# Patient Record
Sex: Female | Born: 1945 | Race: White | Hispanic: No | Marital: Married | Smoking: Former smoker
Health system: Southern US, Community
[De-identification: ages and names within clinical notes are randomized; demographics above are authoritative.]

## PROBLEM LIST (undated history)

## (undated) DIAGNOSIS — G629 Polyneuropathy, unspecified: Secondary | ICD-10-CM

## (undated) DIAGNOSIS — I10 Essential (primary) hypertension: Secondary | ICD-10-CM

## (undated) DIAGNOSIS — G4730 Sleep apnea, unspecified: Secondary | ICD-10-CM

## (undated) DIAGNOSIS — I5189 Other ill-defined heart diseases: Secondary | ICD-10-CM

## (undated) DIAGNOSIS — IMO0002 Reserved for concepts with insufficient information to code with codable children: Secondary | ICD-10-CM

## (undated) DIAGNOSIS — G43909 Migraine, unspecified, not intractable, without status migrainosus: Secondary | ICD-10-CM

## (undated) DIAGNOSIS — K5730 Diverticulosis of large intestine without perforation or abscess without bleeding: Secondary | ICD-10-CM

## (undated) DIAGNOSIS — K210 Gastro-esophageal reflux disease with esophagitis: Secondary | ICD-10-CM

## (undated) DIAGNOSIS — E785 Hyperlipidemia, unspecified: Secondary | ICD-10-CM

## (undated) DIAGNOSIS — I779 Disorder of arteries and arterioles, unspecified: Secondary | ICD-10-CM

## (undated) DIAGNOSIS — M1990 Unspecified osteoarthritis, unspecified site: Secondary | ICD-10-CM

## (undated) DIAGNOSIS — K219 Gastro-esophageal reflux disease without esophagitis: Secondary | ICD-10-CM

## (undated) DIAGNOSIS — K589 Irritable bowel syndrome without diarrhea: Secondary | ICD-10-CM

## (undated) DIAGNOSIS — R9430 Abnormal result of cardiovascular function study, unspecified: Secondary | ICD-10-CM

## (undated) DIAGNOSIS — M797 Fibromyalgia: Secondary | ICD-10-CM

## (undated) DIAGNOSIS — M519 Unspecified thoracic, thoracolumbar and lumbosacral intervertebral disc disorder: Secondary | ICD-10-CM

## (undated) DIAGNOSIS — F329 Major depressive disorder, single episode, unspecified: Secondary | ICD-10-CM

## (undated) DIAGNOSIS — R5383 Other fatigue: Secondary | ICD-10-CM

## (undated) DIAGNOSIS — I739 Peripheral vascular disease, unspecified: Secondary | ICD-10-CM

## (undated) DIAGNOSIS — Z79899 Other long term (current) drug therapy: Secondary | ICD-10-CM

## (undated) DIAGNOSIS — E669 Obesity, unspecified: Secondary | ICD-10-CM

## (undated) DIAGNOSIS — Z9889 Other specified postprocedural states: Secondary | ICD-10-CM

## (undated) DIAGNOSIS — K859 Acute pancreatitis without necrosis or infection, unspecified: Secondary | ICD-10-CM

## (undated) DIAGNOSIS — K648 Other hemorrhoids: Secondary | ICD-10-CM

## (undated) DIAGNOSIS — F32A Depression, unspecified: Secondary | ICD-10-CM

## (undated) HISTORY — DX: Hyperlipidemia, unspecified: E78.5

## (undated) HISTORY — DX: Obesity, unspecified: E66.9

## (undated) HISTORY — DX: Gastro-esophageal reflux disease with esophagitis: K21.0

## (undated) HISTORY — DX: Other fatigue: R53.83

## (undated) HISTORY — DX: Unspecified thoracic, thoracolumbar and lumbosacral intervertebral disc disorder: M51.9

## (undated) HISTORY — DX: Other specified postprocedural states: Z98.890

## (undated) HISTORY — DX: Other hemorrhoids: K64.8

## (undated) HISTORY — DX: Peripheral vascular disease, unspecified: I73.9

## (undated) HISTORY — DX: Unspecified osteoarthritis, unspecified site: M19.90

## (undated) HISTORY — PX: FINGER SURGERY: SHX640

## (undated) HISTORY — DX: Diverticulosis of large intestine without perforation or abscess without bleeding: K57.30

## (undated) HISTORY — DX: Essential (primary) hypertension: I10

## (undated) HISTORY — DX: Other long term (current) drug therapy: Z79.899

## (undated) HISTORY — DX: Abnormal result of cardiovascular function study, unspecified: R94.30

## (undated) HISTORY — DX: Gastro-esophageal reflux disease without esophagitis: K21.9

## (undated) HISTORY — PX: LAPAROSCOPIC OVARIAN CYSTECTOMY: SUR786

## (undated) HISTORY — DX: Irritable bowel syndrome, unspecified: K58.9

## (undated) HISTORY — DX: Polyneuropathy, unspecified: G62.9

## (undated) HISTORY — DX: Fibromyalgia: M79.7

## (undated) HISTORY — DX: Disorder of arteries and arterioles, unspecified: I77.9

## (undated) HISTORY — DX: Other ill-defined heart diseases: I51.89

## (undated) HISTORY — DX: Major depressive disorder, single episode, unspecified: F32.9

## (undated) HISTORY — DX: Reserved for concepts with insufficient information to code with codable children: IMO0002

## (undated) HISTORY — DX: Depression, unspecified: F32.A

## (undated) HISTORY — DX: Acute pancreatitis without necrosis or infection, unspecified: K85.90

## (undated) HISTORY — DX: Sleep apnea, unspecified: G47.30

---
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
## 1994-07-11 HISTORY — PX: TRIGGER FINGER RELEASE: SHX641

## 1999-07-15 ENCOUNTER — Encounter: Payer: Self-pay | Admitting: Internal Medicine

## 1999-07-15 ENCOUNTER — Ambulatory Visit (HOSPITAL_COMMUNITY): Admission: RE | Admit: 1999-07-15 | Discharge: 1999-07-15 | Payer: Self-pay | Admitting: Internal Medicine

## 1999-11-17 ENCOUNTER — Emergency Department (HOSPITAL_COMMUNITY): Admission: EM | Admit: 1999-11-17 | Discharge: 1999-11-17 | Payer: Self-pay | Admitting: Emergency Medicine

## 1999-11-17 ENCOUNTER — Encounter: Payer: Self-pay | Admitting: Emergency Medicine

## 2000-04-21 ENCOUNTER — Encounter (INDEPENDENT_AMBULATORY_CARE_PROVIDER_SITE_OTHER): Payer: Self-pay | Admitting: Specialist

## 2000-04-21 ENCOUNTER — Other Ambulatory Visit: Admission: RE | Admit: 2000-04-21 | Discharge: 2000-04-21 | Payer: Self-pay | Admitting: Gastroenterology

## 2000-04-21 DIAGNOSIS — K2100 Gastro-esophageal reflux disease with esophagitis, without bleeding: Secondary | ICD-10-CM | POA: Insufficient documentation

## 2000-04-21 HISTORY — DX: Gastro-esophageal reflux disease with esophagitis, without bleeding: K21.00

## 2001-03-25 ENCOUNTER — Emergency Department (HOSPITAL_COMMUNITY): Admission: EM | Admit: 2001-03-25 | Discharge: 2001-03-26 | Payer: Self-pay | Admitting: Emergency Medicine

## 2001-03-25 ENCOUNTER — Encounter: Payer: Self-pay | Admitting: Emergency Medicine

## 2002-01-02 ENCOUNTER — Encounter: Payer: Self-pay | Admitting: Rheumatology

## 2002-01-02 ENCOUNTER — Encounter: Admission: RE | Admit: 2002-01-02 | Discharge: 2002-01-02 | Payer: Self-pay | Admitting: Rheumatology

## 2002-04-25 ENCOUNTER — Ambulatory Visit (HOSPITAL_COMMUNITY): Admission: RE | Admit: 2002-04-25 | Discharge: 2002-04-25 | Payer: Self-pay | Admitting: Internal Medicine

## 2002-04-25 ENCOUNTER — Encounter: Payer: Self-pay | Admitting: Internal Medicine

## 2002-06-10 ENCOUNTER — Ambulatory Visit (HOSPITAL_BASED_OUTPATIENT_CLINIC_OR_DEPARTMENT_OTHER): Admission: RE | Admit: 2002-06-10 | Discharge: 2002-06-10 | Payer: Self-pay | Admitting: Specialist

## 2002-07-11 HISTORY — PX: SHOULDER SURGERY: SHX246

## 2004-08-06 ENCOUNTER — Ambulatory Visit: Payer: Self-pay | Admitting: Internal Medicine

## 2004-09-27 ENCOUNTER — Encounter: Admission: RE | Admit: 2004-09-27 | Discharge: 2004-09-27 | Payer: Self-pay | Admitting: General Surgery

## 2004-09-28 ENCOUNTER — Emergency Department: Payer: Self-pay | Admitting: Emergency Medicine

## 2004-09-28 ENCOUNTER — Observation Stay (HOSPITAL_COMMUNITY): Admission: EM | Admit: 2004-09-28 | Discharge: 2004-09-29 | Payer: Self-pay | Admitting: Cardiology

## 2004-09-28 ENCOUNTER — Ambulatory Visit: Payer: Self-pay | Admitting: Cardiology

## 2004-10-04 ENCOUNTER — Ambulatory Visit: Payer: Self-pay

## 2004-10-12 ENCOUNTER — Ambulatory Visit: Payer: Self-pay | Admitting: Internal Medicine

## 2004-10-15 ENCOUNTER — Ambulatory Visit: Payer: Self-pay | Admitting: Cardiology

## 2004-12-03 ENCOUNTER — Ambulatory Visit: Payer: Self-pay | Admitting: Internal Medicine

## 2005-01-24 ENCOUNTER — Ambulatory Visit: Payer: Self-pay | Admitting: Internal Medicine

## 2005-01-25 ENCOUNTER — Ambulatory Visit (HOSPITAL_COMMUNITY): Admission: RE | Admit: 2005-01-25 | Discharge: 2005-01-25 | Payer: Self-pay | Admitting: Internal Medicine

## 2005-08-05 ENCOUNTER — Ambulatory Visit: Payer: Self-pay | Admitting: Internal Medicine

## 2005-08-30 ENCOUNTER — Ambulatory Visit: Payer: Self-pay | Admitting: Internal Medicine

## 2005-09-12 ENCOUNTER — Ambulatory Visit: Payer: Self-pay

## 2005-09-30 ENCOUNTER — Ambulatory Visit: Payer: Self-pay | Admitting: Endocrinology

## 2005-10-04 ENCOUNTER — Ambulatory Visit (HOSPITAL_COMMUNITY): Admission: RE | Admit: 2005-10-04 | Discharge: 2005-10-04 | Payer: Self-pay | Admitting: Endocrinology

## 2005-11-21 ENCOUNTER — Ambulatory Visit: Payer: Self-pay | Admitting: Internal Medicine

## 2006-01-25 ENCOUNTER — Ambulatory Visit: Payer: Self-pay | Admitting: Internal Medicine

## 2006-02-28 ENCOUNTER — Ambulatory Visit: Payer: Self-pay | Admitting: Cardiology

## 2006-04-26 ENCOUNTER — Ambulatory Visit: Payer: Self-pay | Admitting: Internal Medicine

## 2006-05-17 ENCOUNTER — Ambulatory Visit: Payer: Self-pay | Admitting: Internal Medicine

## 2006-06-19 ENCOUNTER — Ambulatory Visit: Payer: Self-pay | Admitting: Internal Medicine

## 2006-06-19 LAB — CONVERTED CEMR LAB
ALT: 34 units/L (ref 0–40)
AST: 26 units/L (ref 0–37)
Albumin: 3.6 g/dL (ref 3.5–5.2)
Alkaline Phosphatase: 82 units/L (ref 39–117)
BUN: 27 mg/dL — ABNORMAL HIGH (ref 6–23)
Basophils Absolute: 0 10*3/uL (ref 0.0–0.1)
Basophils Relative: 0.4 % (ref 0.0–1.0)
Bilirubin Urine: NEGATIVE
CO2: 30 meq/L (ref 19–32)
Calcium: 9.4 mg/dL (ref 8.4–10.5)
Chloride: 105 meq/L (ref 96–112)
Chol/HDL Ratio, serum: 2.7
Cholesterol: 158 mg/dL (ref 0–200)
Creatinine, Ser: 1 mg/dL (ref 0.4–1.2)
Eosinophil percent: 3.6 % (ref 0.0–5.0)
GFR calc non Af Amer: 60 mL/min
Glomerular Filtration Rate, Af Am: 72 mL/min/{1.73_m2}
Glucose, Bld: 103 mg/dL — ABNORMAL HIGH (ref 70–99)
HCT: 40.5 % (ref 36.0–46.0)
HDL: 59.4 mg/dL (ref >39.0)
Hemoglobin, Urine: NEGATIVE
Hemoglobin: 13.2 g/dL (ref 12.0–15.0)
Ketones, ur: NEGATIVE mg/dL
LDL Cholesterol: 86 mg/dL (ref 0–99)
Leukocytes, UA: NEGATIVE
Lymphocytes Relative: 24.2 % (ref 12.0–46.0)
MCHC: 32.7 g/dL (ref 30.0–36.0)
MCV: 89.6 fL (ref 78.0–100.0)
Monocytes Absolute: 0.4 10*3/uL (ref 0.2–0.7)
Monocytes Relative: 7.3 % (ref 3.0–11.0)
Neutro Abs: 4 10*3/uL (ref 1.4–7.7)
Neutrophils Relative %: 64.5 % (ref 43.0–77.0)
Nitrite: NEGATIVE
Platelets: 258 10*3/uL (ref 150–400)
Potassium: 4.3 meq/L (ref 3.5–5.1)
RBC: 4.52 M/uL (ref 3.87–5.11)
RDW: 12.6 % (ref 11.5–14.6)
Sodium: 140 meq/L (ref 135–145)
Specific Gravity, Urine: 1.02 (ref 1.000–1.03)
TSH: 1.71 microintl units/mL (ref 0.35–5.50)
Total Bilirubin: 0.7 mg/dL (ref 0.3–1.2)
Total Protein, Urine: NEGATIVE mg/dL
Total Protein: 6.2 g/dL (ref 6.0–8.3)
Triglyceride fasting, serum: 63 mg/dL (ref 0–149)
Urine Glucose: NEGATIVE mg/dL
Urobilinogen, UA: 0.2 (ref 0.0–1.0)
VLDL: 13 mg/dL (ref 0–40)
WBC: 6.1 10*3/uL (ref 4.5–10.5)
pH: 7 (ref 5.0–8.0)

## 2006-06-26 ENCOUNTER — Encounter: Payer: Self-pay | Admitting: Internal Medicine

## 2006-06-26 ENCOUNTER — Ambulatory Visit: Payer: Self-pay | Admitting: Internal Medicine

## 2006-06-26 ENCOUNTER — Other Ambulatory Visit: Admission: RE | Admit: 2006-06-26 | Discharge: 2006-06-26 | Payer: Self-pay | Admitting: Internal Medicine

## 2006-08-21 ENCOUNTER — Ambulatory Visit: Payer: Self-pay | Admitting: Internal Medicine

## 2006-08-21 LAB — CONVERTED CEMR LAB
ALT: 26 units/L (ref 0–40)
AST: 27 units/L (ref 0–37)
Cholesterol: 155 mg/dL (ref 0–200)
HDL: 54.3 mg/dL (ref >39.0)
LDL Cholesterol: 76 mg/dL (ref 0–99)
Sed Rate: 10 mm/hr (ref 0–25)
Total CHOL/HDL Ratio: 2.9
Total CK: 80 units/L (ref 7–177)
Triglycerides: 123 mg/dL (ref 0–149)
VLDL: 25 mg/dL (ref 0–40)

## 2006-09-13 ENCOUNTER — Ambulatory Visit: Payer: Self-pay

## 2006-10-17 ENCOUNTER — Ambulatory Visit: Payer: Self-pay | Admitting: Internal Medicine

## 2007-03-08 ENCOUNTER — Ambulatory Visit: Payer: Self-pay

## 2007-04-05 ENCOUNTER — Encounter: Payer: Self-pay | Admitting: *Deleted

## 2007-04-05 DIAGNOSIS — K219 Gastro-esophageal reflux disease without esophagitis: Secondary | ICD-10-CM | POA: Insufficient documentation

## 2007-04-05 DIAGNOSIS — M5137 Other intervertebral disc degeneration, lumbosacral region: Secondary | ICD-10-CM | POA: Insufficient documentation

## 2007-04-05 DIAGNOSIS — Z9889 Other specified postprocedural states: Secondary | ICD-10-CM | POA: Insufficient documentation

## 2007-04-05 DIAGNOSIS — M51379 Other intervertebral disc degeneration, lumbosacral region without mention of lumbar back pain or lower extremity pain: Secondary | ICD-10-CM | POA: Insufficient documentation

## 2007-04-05 DIAGNOSIS — M797 Fibromyalgia: Secondary | ICD-10-CM | POA: Insufficient documentation

## 2007-04-05 DIAGNOSIS — E785 Hyperlipidemia, unspecified: Secondary | ICD-10-CM | POA: Insufficient documentation

## 2007-04-05 DIAGNOSIS — G609 Hereditary and idiopathic neuropathy, unspecified: Secondary | ICD-10-CM | POA: Insufficient documentation

## 2007-04-05 DIAGNOSIS — Z9089 Acquired absence of other organs: Secondary | ICD-10-CM | POA: Insufficient documentation

## 2007-04-05 DIAGNOSIS — I10 Essential (primary) hypertension: Secondary | ICD-10-CM | POA: Insufficient documentation

## 2007-04-05 DIAGNOSIS — Z87898 Personal history of other specified conditions: Secondary | ICD-10-CM | POA: Insufficient documentation

## 2007-04-05 DIAGNOSIS — M479 Spondylosis, unspecified: Secondary | ICD-10-CM | POA: Insufficient documentation

## 2007-04-23 ENCOUNTER — Ambulatory Visit: Payer: Self-pay | Admitting: Internal Medicine

## 2007-06-18 ENCOUNTER — Telehealth: Payer: Self-pay | Admitting: Internal Medicine

## 2007-08-09 ENCOUNTER — Encounter: Payer: Self-pay | Admitting: Internal Medicine

## 2007-08-23 ENCOUNTER — Ambulatory Visit: Payer: Self-pay | Admitting: Internal Medicine

## 2007-08-23 LAB — CONVERTED CEMR LAB
ALT: 18 units/L (ref 0–35)
AST: 20 units/L (ref 0–37)
Albumin: 3.8 g/dL (ref 3.5–5.2)
Alkaline Phosphatase: 58 units/L (ref 39–117)
BUN: 15 mg/dL (ref 6–23)
Bacteria, UA: NEGATIVE
Basophils Absolute: 0 10*3/uL (ref 0.0–0.1)
Basophils Relative: 0.2 % (ref 0.0–1.0)
Bilirubin Urine: NEGATIVE
Bilirubin, Direct: 0.1 mg/dL (ref 0.0–0.3)
CO2: 33 meq/L — ABNORMAL HIGH (ref 19–32)
Calcium: 9.4 mg/dL (ref 8.4–10.5)
Chloride: 103 meq/L (ref 96–112)
Cholesterol: 140 mg/dL (ref 0–200)
Creatinine, Ser: 1 mg/dL (ref 0.4–1.2)
Crystals: NEGATIVE
Eosinophils Absolute: 0.2 10*3/uL (ref 0.0–0.6)
Eosinophils Relative: 3.1 % (ref 0.0–5.0)
GFR calc Af Amer: 72 mL/min
GFR calc non Af Amer: 60 mL/min
Glucose, Bld: 106 mg/dL — ABNORMAL HIGH (ref 70–99)
HCT: 39.1 % (ref 36.0–46.0)
HDL: 63.4 mg/dL (ref >39.0)
Hemoglobin, Urine: NEGATIVE
Hemoglobin: 13.3 g/dL (ref 12.0–15.0)
Ketones, ur: NEGATIVE mg/dL
LDL Cholesterol: 67 mg/dL (ref 0–99)
Lymphocytes Relative: 26.5 % (ref 12.0–46.0)
MCHC: 34 g/dL (ref 30.0–36.0)
MCV: 90.9 fL (ref 78.0–100.0)
Monocytes Absolute: 0.4 10*3/uL (ref 0.2–0.7)
Monocytes Relative: 7.1 % (ref 3.0–11.0)
Neutro Abs: 3.7 10*3/uL (ref 1.4–7.7)
Neutrophils Relative %: 63.1 % (ref 43.0–77.0)
Nitrite: NEGATIVE
Platelets: 225 10*3/uL (ref 150–400)
Potassium: 4.2 meq/L (ref 3.5–5.1)
RBC / HPF: NONE SEEN
RBC: 4.3 M/uL (ref 3.87–5.11)
RDW: 12.1 % (ref 11.5–14.6)
Sodium: 140 meq/L (ref 135–145)
Specific Gravity, Urine: <=1.005 (ref 1.000–1.03)
TSH: 1.11 microintl units/mL (ref 0.35–5.50)
Total Bilirubin: 0.8 mg/dL (ref 0.3–1.2)
Total CHOL/HDL Ratio: 2.2
Total Protein, Urine: NEGATIVE mg/dL
Total Protein: 6.3 g/dL (ref 6.0–8.3)
Triglycerides: 46 mg/dL (ref 0–149)
Urine Glucose: NEGATIVE mg/dL
Urobilinogen, UA: 0.2 (ref 0.0–1.0)
VLDL: 9 mg/dL (ref 0–40)
WBC: 5.9 10*3/uL (ref 4.5–10.5)
pH: 7 (ref 5.0–8.0)

## 2007-08-30 ENCOUNTER — Ambulatory Visit: Payer: Self-pay | Admitting: Internal Medicine

## 2007-08-30 DIAGNOSIS — K921 Melena: Secondary | ICD-10-CM | POA: Insufficient documentation

## 2007-08-30 DIAGNOSIS — N760 Acute vaginitis: Secondary | ICD-10-CM | POA: Insufficient documentation

## 2007-08-30 DIAGNOSIS — H9319 Tinnitus, unspecified ear: Secondary | ICD-10-CM | POA: Insufficient documentation

## 2007-09-04 ENCOUNTER — Ambulatory Visit: Payer: Self-pay

## 2007-09-04 ENCOUNTER — Encounter: Payer: Self-pay | Admitting: Internal Medicine

## 2007-09-13 ENCOUNTER — Encounter: Payer: Self-pay | Admitting: Internal Medicine

## 2007-09-13 LAB — HM COLONOSCOPY: HM Colonoscopy: NORMAL

## 2007-09-19 ENCOUNTER — Ambulatory Visit: Payer: Self-pay | Admitting: Gastroenterology

## 2007-10-03 ENCOUNTER — Encounter: Payer: Self-pay | Admitting: Internal Medicine

## 2007-10-03 ENCOUNTER — Ambulatory Visit: Payer: Self-pay | Admitting: Gastroenterology

## 2007-10-03 DIAGNOSIS — K5730 Diverticulosis of large intestine without perforation or abscess without bleeding: Secondary | ICD-10-CM

## 2007-10-03 DIAGNOSIS — K648 Other hemorrhoids: Secondary | ICD-10-CM

## 2007-10-03 HISTORY — DX: Other hemorrhoids: K64.8

## 2007-10-03 HISTORY — DX: Diverticulosis of large intestine without perforation or abscess without bleeding: K57.30

## 2007-10-15 ENCOUNTER — Telehealth: Payer: Self-pay | Admitting: Internal Medicine

## 2007-10-16 ENCOUNTER — Telehealth: Payer: Self-pay | Admitting: Internal Medicine

## 2007-10-18 ENCOUNTER — Ambulatory Visit: Payer: Self-pay | Admitting: Internal Medicine

## 2007-10-22 ENCOUNTER — Encounter (INDEPENDENT_AMBULATORY_CARE_PROVIDER_SITE_OTHER): Payer: Self-pay | Admitting: *Deleted

## 2007-11-05 ENCOUNTER — Ambulatory Visit: Payer: Self-pay | Admitting: Internal Medicine

## 2007-11-05 LAB — CONVERTED CEMR LAB
BUN: 20 mg/dL (ref 6–23)
CO2: 31 meq/L (ref 19–32)
Calcium: 9.1 mg/dL (ref 8.4–10.5)
Chloride: 109 meq/L (ref 96–112)
Creatinine, Ser: 1 mg/dL (ref 0.4–1.2)
GFR calc Af Amer: 72 mL/min
GFR calc non Af Amer: 60 mL/min
Glucose, Bld: 97 mg/dL (ref 70–99)
Potassium: 4.1 meq/L (ref 3.5–5.1)
Sodium: 143 meq/L (ref 135–145)

## 2007-11-06 ENCOUNTER — Encounter: Payer: Self-pay | Admitting: Internal Medicine

## 2007-11-07 ENCOUNTER — Ambulatory Visit: Payer: Self-pay | Admitting: Internal Medicine

## 2007-11-12 ENCOUNTER — Telehealth: Payer: Self-pay | Admitting: Internal Medicine

## 2007-11-28 ENCOUNTER — Ambulatory Visit: Payer: Self-pay | Admitting: Cardiology

## 2007-11-30 ENCOUNTER — Encounter: Payer: Self-pay | Admitting: Internal Medicine

## 2008-03-06 ENCOUNTER — Encounter: Payer: Self-pay | Admitting: Internal Medicine

## 2008-03-06 ENCOUNTER — Ambulatory Visit: Payer: Self-pay

## 2008-03-13 ENCOUNTER — Telehealth (INDEPENDENT_AMBULATORY_CARE_PROVIDER_SITE_OTHER): Payer: Self-pay | Admitting: *Deleted

## 2008-03-26 ENCOUNTER — Ambulatory Visit: Payer: Self-pay | Admitting: Internal Medicine

## 2008-03-26 LAB — CONVERTED CEMR LAB
ALT: 22 units/L (ref 0–35)
AST: 25 units/L (ref 0–37)
Albumin: 3.9 g/dL (ref 3.5–5.2)
Alkaline Phosphatase: 59 units/L (ref 39–117)
Bilirubin, Direct: 0.1 mg/dL (ref 0.0–0.3)
Cholesterol: 153 mg/dL (ref 0–200)
HDL: 87.7 mg/dL (ref >39.0)
LDL Cholesterol: 57 mg/dL (ref 0–99)
Total Bilirubin: 0.8 mg/dL (ref 0.3–1.2)
Total CHOL/HDL Ratio: 1.7
Total Protein: 6.1 g/dL (ref 6.0–8.3)
Triglycerides: 40 mg/dL (ref 0–149)
VLDL: 8 mg/dL (ref 0–40)

## 2008-03-27 ENCOUNTER — Encounter: Payer: Self-pay | Admitting: Internal Medicine

## 2008-04-10 ENCOUNTER — Telehealth: Payer: Self-pay | Admitting: Internal Medicine

## 2008-04-15 ENCOUNTER — Encounter: Payer: Self-pay | Admitting: Internal Medicine

## 2008-04-17 ENCOUNTER — Encounter: Payer: Self-pay | Admitting: Internal Medicine

## 2008-04-17 ENCOUNTER — Telehealth: Payer: Self-pay | Admitting: Gastroenterology

## 2008-05-22 ENCOUNTER — Ambulatory Visit: Payer: Self-pay | Admitting: Internal Medicine

## 2008-05-22 DIAGNOSIS — F32A Depression, unspecified: Secondary | ICD-10-CM | POA: Insufficient documentation

## 2008-05-22 DIAGNOSIS — F329 Major depressive disorder, single episode, unspecified: Secondary | ICD-10-CM | POA: Insufficient documentation

## 2008-06-09 ENCOUNTER — Ambulatory Visit: Payer: Self-pay | Admitting: Internal Medicine

## 2008-06-09 LAB — CONVERTED CEMR LAB
Cholesterol, target level: 200 mg/dL
HDL goal, serum: 40 mg/dL
LDL Goal: 160 mg/dL

## 2008-08-08 ENCOUNTER — Encounter: Payer: Self-pay | Admitting: Internal Medicine

## 2008-09-04 ENCOUNTER — Ambulatory Visit: Payer: Self-pay

## 2008-09-04 ENCOUNTER — Encounter: Payer: Self-pay | Admitting: Internal Medicine

## 2008-10-03 ENCOUNTER — Ambulatory Visit: Payer: Self-pay | Admitting: Internal Medicine

## 2008-10-03 LAB — CONVERTED CEMR LAB
ALT: 21 units/L (ref 0–35)
AST: 22 units/L (ref 0–37)
Albumin: 3.8 g/dL (ref 3.5–5.2)
Alkaline Phosphatase: 58 units/L (ref 39–117)
BUN: 22 mg/dL (ref 6–23)
Basophils Absolute: 0 10*3/uL (ref 0.0–0.1)
Basophils Relative: 0.4 % (ref 0.0–3.0)
Bilirubin Urine: NEGATIVE
Bilirubin, Direct: 0.1 mg/dL (ref 0.0–0.3)
CO2: 32 meq/L (ref 19–32)
Calcium: 9.3 mg/dL (ref 8.4–10.5)
Chloride: 105 meq/L (ref 96–112)
Cholesterol: 168 mg/dL (ref 0–200)
Creatinine, Ser: 0.9 mg/dL (ref 0.4–1.2)
Eosinophils Absolute: 0.2 10*3/uL (ref 0.0–0.7)
Eosinophils Relative: 3.5 % (ref 0.0–5.0)
GFR calc non Af Amer: 67.08 mL/min (ref >60)
Glucose, Bld: 101 mg/dL — ABNORMAL HIGH (ref 70–99)
HCT: 40.5 % (ref 36.0–46.0)
HDL: 67.9 mg/dL (ref >39.00)
Hemoglobin, Urine: NEGATIVE
Hemoglobin: 13.8 g/dL (ref 12.0–15.0)
Ketones, ur: NEGATIVE mg/dL
LDL Cholesterol: 86 mg/dL (ref 0–99)
Lymphocytes Relative: 30.1 % (ref 12.0–46.0)
Lymphs Abs: 1.7 10*3/uL (ref 0.7–4.0)
MCHC: 34 g/dL (ref 30.0–36.0)
MCV: 90.1 fL (ref 78.0–100.0)
Monocytes Absolute: 0.4 10*3/uL (ref 0.1–1.0)
Monocytes Relative: 7.2 % (ref 3.0–12.0)
Neutro Abs: 3.2 10*3/uL (ref 1.4–7.7)
Neutrophils Relative %: 58.8 % (ref 43.0–77.0)
Nitrite: NEGATIVE
Platelets: 229 10*3/uL (ref 150.0–400.0)
Potassium: 3.9 meq/L (ref 3.5–5.1)
RBC: 4.49 M/uL (ref 3.87–5.11)
RDW: 12.7 % (ref 11.5–14.6)
Sodium: 141 meq/L (ref 135–145)
Specific Gravity, Urine: 1.02 (ref 1.000–1.030)
TSH: 1.63 microintl units/mL (ref 0.35–5.50)
Total Bilirubin: 0.8 mg/dL (ref 0.3–1.2)
Total CHOL/HDL Ratio: 2
Total Protein, Urine: NEGATIVE mg/dL
Total Protein: 6.3 g/dL (ref 6.0–8.3)
Triglycerides: 69 mg/dL (ref 0.0–149.0)
Urine Glucose: NEGATIVE mg/dL
Urobilinogen, UA: 0.2 (ref 0.0–1.0)
VLDL: 13.8 mg/dL (ref 0.0–40.0)
WBC: 5.5 10*3/uL (ref 4.5–10.5)
pH: 6 (ref 5.0–8.0)

## 2008-10-08 ENCOUNTER — Telehealth: Payer: Self-pay | Admitting: Internal Medicine

## 2008-10-09 ENCOUNTER — Ambulatory Visit: Payer: Self-pay | Admitting: Internal Medicine

## 2008-10-09 ENCOUNTER — Other Ambulatory Visit: Admission: RE | Admit: 2008-10-09 | Discharge: 2008-10-09 | Payer: Self-pay | Admitting: Internal Medicine

## 2008-10-09 ENCOUNTER — Encounter: Payer: Self-pay | Admitting: Internal Medicine

## 2008-10-13 ENCOUNTER — Telehealth: Payer: Self-pay | Admitting: Internal Medicine

## 2008-10-15 ENCOUNTER — Encounter (INDEPENDENT_AMBULATORY_CARE_PROVIDER_SITE_OTHER): Payer: Self-pay | Admitting: *Deleted

## 2008-10-15 ENCOUNTER — Ambulatory Visit: Payer: Self-pay | Admitting: Internal Medicine

## 2008-10-15 DIAGNOSIS — H6690 Otitis media, unspecified, unspecified ear: Secondary | ICD-10-CM | POA: Insufficient documentation

## 2008-10-20 ENCOUNTER — Encounter: Payer: Self-pay | Admitting: Internal Medicine

## 2008-10-21 ENCOUNTER — Encounter: Payer: Self-pay | Admitting: Internal Medicine

## 2008-10-23 ENCOUNTER — Encounter: Payer: Self-pay | Admitting: Internal Medicine

## 2008-12-15 ENCOUNTER — Telehealth: Payer: Self-pay | Admitting: Internal Medicine

## 2009-01-05 ENCOUNTER — Ambulatory Visit: Payer: Self-pay | Admitting: Internal Medicine

## 2009-01-05 DIAGNOSIS — M19079 Primary osteoarthritis, unspecified ankle and foot: Secondary | ICD-10-CM | POA: Insufficient documentation

## 2009-04-15 ENCOUNTER — Telehealth (INDEPENDENT_AMBULATORY_CARE_PROVIDER_SITE_OTHER): Payer: Self-pay | Admitting: *Deleted

## 2009-04-16 ENCOUNTER — Telehealth: Payer: Self-pay | Admitting: Internal Medicine

## 2009-04-17 ENCOUNTER — Ambulatory Visit: Payer: Self-pay | Admitting: Internal Medicine

## 2009-04-17 DIAGNOSIS — G4700 Insomnia, unspecified: Secondary | ICD-10-CM | POA: Insufficient documentation

## 2009-04-20 ENCOUNTER — Ambulatory Visit: Payer: Self-pay | Admitting: Internal Medicine

## 2009-04-20 ENCOUNTER — Encounter: Payer: Self-pay | Admitting: Internal Medicine

## 2009-04-29 ENCOUNTER — Telehealth: Payer: Self-pay | Admitting: Internal Medicine

## 2009-04-30 ENCOUNTER — Telehealth: Payer: Self-pay | Admitting: Internal Medicine

## 2009-05-07 ENCOUNTER — Ambulatory Visit: Payer: Self-pay | Admitting: Internal Medicine

## 2009-05-07 LAB — CONVERTED CEMR LAB
ALT: 22 units/L (ref 0–35)
AST: 26 units/L (ref 0–37)
Albumin: 4 g/dL (ref 3.5–5.2)
Alkaline Phosphatase: 60 units/L (ref 39–117)
Bilirubin, Direct: 0.1 mg/dL (ref 0.0–0.3)
Cholesterol: 154 mg/dL (ref 0–200)
HDL: 66.7 mg/dL (ref >39.00)
LDL Cholesterol: 77 mg/dL (ref 0–99)
Total Bilirubin: 0.6 mg/dL (ref 0.3–1.2)
Total CHOL/HDL Ratio: 2
Total Protein: 6.9 g/dL (ref 6.0–8.3)
Triglycerides: 53 mg/dL (ref 0.0–149.0)
VLDL: 10.6 mg/dL (ref 0.0–40.0)

## 2009-05-18 ENCOUNTER — Encounter: Payer: Self-pay | Admitting: Internal Medicine

## 2009-05-25 ENCOUNTER — Encounter: Payer: Self-pay | Admitting: Internal Medicine

## 2009-07-06 ENCOUNTER — Ambulatory Visit: Payer: Self-pay | Admitting: Internal Medicine

## 2009-09-03 ENCOUNTER — Encounter: Payer: Self-pay | Admitting: Internal Medicine

## 2009-09-03 LAB — HM MAMMOGRAPHY: HM Mammogram: NORMAL

## 2009-09-09 ENCOUNTER — Encounter: Payer: Self-pay | Admitting: Internal Medicine

## 2009-09-10 ENCOUNTER — Telehealth: Payer: Self-pay | Admitting: Internal Medicine

## 2009-09-10 ENCOUNTER — Encounter: Payer: Self-pay | Admitting: Internal Medicine

## 2009-09-10 ENCOUNTER — Ambulatory Visit: Payer: Self-pay

## 2009-11-11 ENCOUNTER — Encounter: Payer: Self-pay | Admitting: Cardiology

## 2009-11-12 ENCOUNTER — Ambulatory Visit: Payer: Self-pay | Admitting: Cardiology

## 2009-11-25 ENCOUNTER — Telehealth: Payer: Self-pay | Admitting: Cardiology

## 2009-12-18 ENCOUNTER — Ambulatory Visit: Payer: Self-pay | Admitting: Internal Medicine

## 2009-12-18 LAB — CONVERTED CEMR LAB
ALT: 18 units/L (ref 0–35)
AST: 22 units/L (ref 0–37)
Albumin: 3.8 g/dL (ref 3.5–5.2)
Alkaline Phosphatase: 65 units/L (ref 39–117)
BUN: 17 mg/dL (ref 6–23)
Basophils Absolute: 0 10*3/uL (ref 0.0–0.1)
Basophils Relative: 0.2 % (ref 0.0–3.0)
Bilirubin Urine: NEGATIVE
Bilirubin, Direct: 0 mg/dL (ref 0.0–0.3)
CO2: 32 meq/L (ref 19–32)
Calcium: 9.3 mg/dL (ref 8.4–10.5)
Chloride: 106 meq/L (ref 96–112)
Cholesterol: 179 mg/dL (ref 0–200)
Creatinine, Ser: 0.8 mg/dL (ref 0.4–1.2)
Eosinophils Absolute: 0.2 10*3/uL (ref 0.0–0.7)
Eosinophils Relative: 4.3 % (ref 0.0–5.0)
GFR calc non Af Amer: 76.55 mL/min (ref >60)
Glucose, Bld: 90 mg/dL (ref 70–99)
HCT: 38.9 % (ref 36.0–46.0)
HDL: 67 mg/dL (ref >39.00)
Hemoglobin, Urine: NEGATIVE
Hemoglobin: 13.5 g/dL (ref 12.0–15.0)
Ketones, ur: NEGATIVE mg/dL
LDL Cholesterol: 95 mg/dL (ref 0–99)
Leukocytes, UA: NEGATIVE
Lymphocytes Relative: 29.9 % (ref 12.0–46.0)
Lymphs Abs: 1.4 10*3/uL (ref 0.7–4.0)
MCHC: 34.6 g/dL (ref 30.0–36.0)
MCV: 89.8 fL (ref 78.0–100.0)
Monocytes Absolute: 0.4 10*3/uL (ref 0.1–1.0)
Monocytes Relative: 7.6 % (ref 3.0–12.0)
Neutro Abs: 2.8 10*3/uL (ref 1.4–7.7)
Neutrophils Relative %: 58 % (ref 43.0–77.0)
Nitrite: NEGATIVE
Platelets: 229 10*3/uL (ref 150.0–400.0)
Potassium: 4.6 meq/L (ref 3.5–5.1)
RBC: 4.33 M/uL (ref 3.87–5.11)
RDW: 13.6 % (ref 11.5–14.6)
Sodium: 141 meq/L (ref 135–145)
Specific Gravity, Urine: 1.02 (ref 1.000–1.030)
TSH: 1.16 microintl units/mL (ref 0.35–5.50)
Total Bilirubin: 0.7 mg/dL (ref 0.3–1.2)
Total CHOL/HDL Ratio: 3
Total Protein, Urine: NEGATIVE mg/dL
Total Protein: 6.1 g/dL (ref 6.0–8.3)
Triglycerides: 85 mg/dL (ref 0.0–149.0)
Urine Glucose: NEGATIVE mg/dL
Urobilinogen, UA: 0.2 (ref 0.0–1.0)
VLDL: 17 mg/dL (ref 0.0–40.0)
WBC: 4.8 10*3/uL (ref 4.5–10.5)
pH: 7 (ref 5.0–8.0)

## 2009-12-25 ENCOUNTER — Ambulatory Visit: Payer: Self-pay | Admitting: Internal Medicine

## 2010-03-09 ENCOUNTER — Encounter: Payer: Self-pay | Admitting: Internal Medicine

## 2010-03-17 ENCOUNTER — Ambulatory Visit: Payer: Self-pay | Admitting: Internal Medicine

## 2010-03-17 DIAGNOSIS — R609 Edema, unspecified: Secondary | ICD-10-CM | POA: Insufficient documentation

## 2010-03-17 LAB — CONVERTED CEMR LAB
BUN: 16 mg/dL (ref 6–23)
CO2: 31 meq/L (ref 19–32)
Calcium: 9.5 mg/dL (ref 8.4–10.5)
Chloride: 103 meq/L (ref 96–112)
Creatinine, Ser: 1 mg/dL (ref 0.4–1.2)
GFR calc non Af Amer: 62.73 mL/min (ref >60)
Glucose, Bld: 84 mg/dL (ref 70–99)
Potassium: 4.9 meq/L (ref 3.5–5.1)
Sodium: 141 meq/L (ref 135–145)

## 2010-03-21 ENCOUNTER — Telehealth: Payer: Self-pay | Admitting: Internal Medicine

## 2010-03-31 ENCOUNTER — Ambulatory Visit: Payer: Medicare Other | Admitting: Ophthalmology

## 2010-04-19 ENCOUNTER — Telehealth: Payer: Self-pay | Admitting: Internal Medicine

## 2010-04-28 ENCOUNTER — Ambulatory Visit: Payer: Self-pay | Admitting: Internal Medicine

## 2010-07-19 ENCOUNTER — Ambulatory Visit
Admission: RE | Admit: 2010-07-19 | Discharge: 2010-07-19 | Payer: Self-pay | Source: Home / Self Care | Attending: Internal Medicine | Admitting: Internal Medicine

## 2010-07-20 ENCOUNTER — Telehealth: Payer: Self-pay | Admitting: Internal Medicine

## 2010-08-12 NOTE — Miscellaneous (Signed)
Summary: BONE DENSITY  Clinical Lists Changes  Orders: Added new Test order of T-Bone Densitometry (77080) - Signed Added new Test order of T-Lumbar Vertebral Assessment (77082) - Signed 

## 2010-08-12 NOTE — Progress Notes (Signed)
  Phone Note Outgoing Call   Summary of Call: please call patient: cxr and rib details normal. Continue with heat and NSAID of choice  Thanks Initial call taken by: Jacques Navy MD,  July 20, 2010 12:15 PM  Follow-up for Phone Call        informed pt Follow-up by: Ami Bullins CMA,  July 20, 2010 1:16 PM

## 2010-08-12 NOTE — Progress Notes (Signed)
  Phone Note Refill Request Message from:  Fax from Pharmacy on April 15, 2009 11:27 AM  Refills Requested: Medication #1:  TEMAZEPAM 15 MG  CAPS 1 by mouth at bedtime prn  Medication #2:  MELOXICAM 15 MG  TABS Take one tablet daily or as needed  Follow-up for Phone Call        Can pt have a 90 day perscription sent to [facility]. Pt was given 90 day in July but it went to a local [facility], can I canceled that and sent them to mail order. Please Advise Follow-up by: Ami Bullins CMA,  April 15, 2009 11:31 AM  Additional Follow-up for Phone Call Additional follow up Details #1::        thank you....Marland Kitchenyes cancel the local prescriptions and send 90 day Rxs to [facility]. Additional Follow-up by: Jacques Navy MD,  April 15, 2009 1:15 PM    Additional Follow-up for Phone Call Additional follow up Details #2::    Pt states that she actually just sent in Rx's that were written in April to [facility]. So a refill shouldn't be needed. She states that she has had some problems with the tamazepam. She said she cannot sleep when she takes this medication. Pt is going to discuss this information with Dr Debby Bud at her visit next week. Follow-up by: Ami Bullins CMA,  April 15, 2009 1:33 PM
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
## 2010-08-12 NOTE — Letter (Signed)
   [facility] [address] [place], [place]  [address] Phone: [phone]      May 25, 2009   [name] [address] [place], [place] [address]  RE:  LAB RESULTS  Dear  Ms. Camera,  The following is an interpretation of your most recent lab tests.  Please take note of any instructions provided or changes to medications that have resulted from your lab work.     Bopne density study shows normal range bone density spine and hips.    Sincerely Yours,    Jacques Navy MD

## 2010-08-12 NOTE — Assessment & Plan Note (Signed)
Summary: f2y  Medications Added FA-VITAMIN B-6-VITAMIN B-12 2.2-25-0.5 MG TABS (FOLIC ACID-VIT B6-VIT B12) Take 1 tablet by mouth once daily FLONASE 50 MCG/ACT SUSP (FLUTICASONE PROPIONATE) 1 spray each nostril  every morning as needed ASPIRIN 81 MG TBEC (ASPIRIN) Take one tablet by mouth daily CRESTOR 5 MG TABS (ROSUVASTATIN CALCIUM) Take one tablet 3 days a week      Allergies Added:   Visit Type:  Follow-up Primary Provider:  Jacques Navy MD  CC:  history of chest pain.  History of Present Illness: The patient is seen for cardiology followup.  I saw her last in May, 2009.  She had some chest pain in the past.  Nuclear exercise testing was normal in 2006.  The patient does have vascular disease.  Her most recent carotid Doppler revealed that her disease is stable with 0-39% RICA and 60-79% LICA.  She is scheduled to have a followup study in one year.  Unfortunately she does not tolerate statins.  She felt poorly with simvastatin in the past.  She was able to take Crestor for approximately 60 days before she began having some symptoms.  She therefore stopped.  She's not having any significant chest pain or shortness of breath.  Current Medications (verified): 1)  Neurontin 300 Mg Caps (Gabapentin) .... Take 1 Tablet By Mouth Two Times A Day 2)  Omeprazole 40 Mg  Cpdr (Omeprazole) .Marland Kitchen.. 1 Qam As Needed Dyspepsia 3)  Meloxicam 15 Mg  Tabs (Meloxicam) .... Take One Tablet Daily or As Needed 4)  Fluocinonide 0.05 %  Gel (Fluocinonide) .... Use As Needed 5)  Stool Softener .... Take As Needed 6)  Temazepam 15 Mg  Caps (Temazepam) .Marland Kitchen.. 1 By Mouth At Bedtime Prn 7)  Citalopram Hydrobromide 20 Mg Tabs (Citalopram Hydrobromide) .... Once Daily 8)  Anusol-Hc 25 Mg  Supp (Hydrocortisone Acetate) .Marland Kitchen.. 1 Pr Two Times A Day X 6 Days 9)  Amerge 1 Mg Tabs (Naratriptan Hcl) .Marland Kitchen.. 1 As Needed H/a, May Repeat One More in 2 Hours. Max 2 Tabs in 24 Hours 10)  Robaxin 500 Mg Tabs (Methocarbamol) ....  One By Mouth Three Times A Day As Needed Back Spasm 11)  Fa-Vitamin B-6-Vitamin B-12 2.2-25-0.5 Mg Tabs (Folic Acid-Vit B6-Vit B12) .... Take 1 Tablet By Mouth Once Daily 12)  Tessalon Perles 100 Mg Caps (Benzonatate) .Marland Kitchen.. 1 Three Times A Day As Needed Cough 13)  Flonase 50 Mcg/act Susp (Fluticasone Propionate) .Marland Kitchen.. 1 Spray Each Nostril  Every Morning As Needed 14)  Hydrochlorothiazide 25 Mg Tabs (Hydrochlorothiazide) .... Take 1 Tablet By Mouth Once A Day 15)  Hydrocodone-Acetaminophen 10-325 Mg Tabs (Hydrocodone-Acetaminophen) .Marland Kitchen.. 1 By Mouth Q 6 As Needed  Allergies (verified): 1)  ! Codeine  Past History:  Past Medical History: Hx of PERIPHERAL NEUROPATHY (ICD-356.9) FIBROMYALGIA (ICD-729.1) PLANTAR FASCIITIS (ICD-728.71) HYPERLIPIDEMIA (ICD-272.4) DEGENERATIVE JOINT DISEASE, 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 Doppler.... September 10, 2009.... 0-39% RICA, 60-79% LICA Chest pain...Marland KitchenMarland KitchenNuclear stress... 2006... normal Statin therapy.... muscle aches from simvastatin and 5 mg Crestor  Review of Systems       Patient denies fever, chills, headache, sweats, rash, change in vision, change in hearing, chest pain, cough, nausea vomiting, urinary symptoms.  All other systems are reviewed and are negative.  Vital Signs:  Patient profile:   65 year old female Height:      64 inches Weight:      174 pounds  BMI:     29.97 Pulse rate:   67 / minute BP sitting:   164 / 88  (left arm) Cuff size:   regular  Vitals Entered By: Hardin Negus, RMA (Nov 12, 2009 9:08 AM)  Physical Exam  General:  patient is quite stable in general. Head:  head is atraumatic. Eyes:  no xanthelasma. Neck:  no jugular venous stent.  Soft left carotid bruit. Chest Wall:  no chest wall tenderness. Lungs:  lungs are clear.  Respiratory effort is  unlabored. Heart:  cardiac exam reveals S1 and S2.  There are no clicks or significant murmurs. Abdomen:  abdomen is soft. Msk:  no musculoskeletal deformities. Extremities:  no peripheral edema. Skin:  no skin rashes. Psych:  patient is oriented to person time and place.  Affect is normal.   Impression & Recommendations:  Problem # 1:  CHEST PAIN (ICD-786.50)  Orders: EKG w/ Interpretation (93000) The patient is not having any significant recurrent chest pain.  She does have vascular disease.  EKG is done today and reviewed by me.  It is normal.  No further ischemic workup is needed.  Her updated medication list for this problem includes:    Aspirin 81 Mg Tbec (Aspirin) .Marland Kitchen... Take one tablet by mouth daily  Problem # 2:  CAROTID ARTERY DISEASE (ICD-433.10)  The patient has significant carotid disease.  Her Dopplers had been scheduled every 6 months but had been stable.  She has been changed to yearly checks.  It would be optimal for her to be on a statin. The patient has not been taking aspirin.  We will start 81 mg daily.  Her updated medication list for this problem includes:    Aspirin 81 Mg Tbec (Aspirin) .Marland Kitchen... Take one tablet by mouth daily  Problem # 3:  * STATIN THERAPY.. MUSCLE PAIN With the patient's vascular disease statin therapy certainly would be appropriate.  I have encouraged her to try taking Crestor 3 days per week.  Problem # 4:  HYPERTENSION (ICD-401.9)  Her updated medication list for this problem includes:    Hydrochlorothiazide 25 Mg Tabs (Hydrochlorothiazide) .Marland Kitchen... Take 1 tablet by mouth once a day    Aspirin 81 Mg Tbec (Aspirin) .Marland Kitchen... Take one tablet by mouth daily Systolic blood pressure is mildly elevated today.  She will need followup checks with adjustment of her meds if her systolic remains elevated.  Patient Instructions: 1)  Your physician discussed the risks, benefits and indications for preventive aspirin therapy. It is recommended that you  start (or continue) taking 81 mg of aspirin a day. 2)  Restart Crestor, take 1 tab 3 days a week 3)  Your physician has requested that you regularly monitor and record your blood pressure readings at home.  Please use the same machine at the same time of day to check your readings and record them.  Give me a call in about 2 weeks with some readings.  4)  Follow up in 1 year

## 2010-08-12 NOTE — Progress Notes (Signed)
Summary: bp   Phone Note Call from Patient Call back at Home Phone [phone]   Caller: Patient Reason for Call: Talk to Nurse Summary of Call: request to speak to the nurse regarding bp Initial call taken by: Migdalia Dk,  Nov 25, 2009 8:06 AM  Follow-up for Phone Call        pt has been checking BP and its running 131-140s/74-82 will send info to Dr Myrtis Ser for review Meredith Staggers, RN  Nov 25, 2009 12:57 PM   Additional Follow-up for Phone Call Additional follow up Details #1::        This looks godd. No change in Rx.     Appended Document: bp left mess on machine BP good no change on meds

## 2010-08-12 NOTE — Progress Notes (Signed)
Summary: REFILLs  Phone Note Refill Request Call back at Home Phone [phone] Call back at Work Phone [phone]   Refills Requested: Medication #1:  NEURONTIN 300 MG CAPS Take 1 tablet by mouth two times a day  Medication #2:  HYDROCHLOROTHIAZIDE 25 MG TABS Take 1 tablet by mouth once a day  Medication #3:  MELOXICAM 15 MG  TABS Take one tablet daily or as needed Initial call taken by: Lamar Sprinkles, CMA,  September 10, 2009 9:35 AM    Prescriptions: HYDROCHLOROTHIAZIDE 25 MG TABS (HYDROCHLOROTHIAZIDE) Take 1 tablet by mouth once a day  #90 x 1   Entered by:   Lamar Sprinkles, CMA   Authorized by:   Jacques Navy MD   Signed by:   Lamar Sprinkles, CMA on 09/10/2009   Method used:   [id number]       [address]       [place], [place]  [address]       Ph: [phone] or [phone]       Fax: [phone]   RxID:   [id number] MELOXICAM 15 MG  TABS (MELOXICAM) Take one tablet daily or as needed  #90 x 1   Entered by:   Lamar Sprinkles, CMA   Authorized by:   Jacques Navy MD   Signed by:   Lamar Sprinkles, CMA on 09/10/2009   Method used:   [facility]        [id number]       [address]       [place], [place]  [address]       Ph: [phone] or [phone]       Fax: [phone]   RxID:   [id number] NEURONTIN 300 MG CAPS (GABAPENTIN) Take 1 tablet by mouth two times a day  #180 x 1   Entered by:   Lamar Sprinkles, CMA   Authorized by:   Jacques Navy MD   Signed by:   Lamar Sprinkles, CMA on 09/10/2009   Method used:   [facility] [id number]       [address]       [place], [place]  [address]       Ph: [phone] or [phone]       Fax: [phone]   RxID:   [id number]

## 2010-08-12 NOTE — Progress Notes (Signed)
Summary: Change in Med  Phone Note Call from Patient   Summary of Call: Pt currently takes amerge but says that her insurance has given pt a generic alternative, sumatriptan. This would save pt $. If ok, pt needs enough for 3 mth supply. Initial call taken by: Lamar Sprinkles,  October 15, 2007 10:02 AM  Follow-up for Phone Call        sumatriptan (imitrex) 100mg , sig 1 by mouth as needed HA, #18, refill 3 Follow-up by: Jacques Navy MD,  October 15, 2007 10:06 AM  Additional Follow-up for Phone Call Additional follow up Details #1::        Lf mess, mail to pt or pick up at office ..................................................................Marland KitchenLamar Sprinkles  October 16, 2007 8:55 AM     Additional Follow-up for Phone Call Additional follow up Details #2::    Pt informed  Follow-up by: Lamar Sprinkles,  October 16, 2007 6:56 PM  New/Updated Medications: SUMATRIPTAN SUCCINATE 100 MG  TABS (SUMATRIPTAN SUCCINATE) 1 as needed h/a   Prescriptions: SUMATRIPTAN SUCCINATE 100 MG  TABS (SUMATRIPTAN SUCCINATE) 1 as needed h/a  #18 x 3   Entered by:   Lamar Sprinkles   Authorized by:   Jacques Navy MD   Signed by:   Lamar Sprinkles on 10/16/2007   Method used:   Print then Give to Patient   RxID:   [id number]

## 2010-08-12 NOTE — Assessment & Plan Note (Signed)
Summary: CPX/AWARE OF FEE/NML   Vital Signs:  Patient Profile:   65 Years Old Female Height:     64 inches Weight:      156 pounds Temp:     99.1 degrees F oral Pulse rate:   73 / minute BP sitting:   154 / 78  (left arm) Cuff size:   regular  Vitals Entered By: Zackery Barefoot CMA (August 30, 2007 1:38 PM)                 PCP:  Jacques Navy MD  Chief Complaint:  CPX.  History of Present Illness: Presents for follow-up evaluation. cc: 1. Sleep - taking temazepam daily. Needs refill 2. Migraine - Amerge works for her. She needs refill 3. Depression- does well with citalopram. Needs refill 4. Back pain - had spinal injections Sept. '07. She had used 10/325 oxycodone/APAP as needed. She is having backpain. She is seeing a chiropractor for adjustment, most recently today. Would like a Rx for as needed use. Also taking meloxicam on a daily basis. 5. Tinnitus - has become more of a problem. Meloxicam can cause tinnitus. 6. Vulvar pruritis-left sided. Mild erythema and fissuring.Troubled for several months. 7. Hematochezia - has h/o hemorrhoidal bleeding. Responded to rectal suppositories in the past. 8. Flatus/ bloats and gas - does use beano. 9. Peripheral neuropathy - feet: had anodyne therapy wthich did help but since stopping treatments pain has returned-worse in the instep. Also had PT with the anodyne therapy.    Current Allergies: ! 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)       GASTROESOPHAGEAL REFLUX DISEASE (ICD-530.81)       DISC DISEASE, LUMBAR (ICD-722.52)       MIGRAINES, HX OF (ICD-V13.8)       HYPERTENSION (ICD-401.9)                 Past Surgical History:    Reviewed history from  04/05/2007 and no changes required:       APPENDECTOMY, HX OF (ICD-V45.79)       OVARIAN CYSTECTOMY, HX OF (ICD-V45.89)            Risk Factors:  Alcohol use:  no Exercise:  no Seatbelt use:  100 %  Mammogram History:     Date of Last Mammogram:  08/09/2007    Results:  Normal Bilateral   Colonoscopy History:     Date of Last Colonoscopy:  04/10/2001    Results:  Diverticulosis    Review of Systems       The patient complains of weight loss, hematochezia, and genital sores.  The patient denies anorexia, fever, vision loss, decreased hearing, hoarseness, chest pain, syncope, dyspnea on exhertion, peripheral edema, prolonged cough, hemoptysis, abdominal pain, melena, hematuria, incontinence, muscle weakness, transient blindness, difficulty walking, unusual weight change, angioedema, and breast masses.         at [employer] - lost approx 30lbs.   Physical Exam  General:     Well-developed,well-nourished,in no acute distress; alert,appropriate and cooperative throughout examination Head:     Normocephalic and atraumatic without obvious abnormalities. No apparent alopecia or balding. Eyes:  No corneal or conjunctival inflammation noted. EOMI. Perrla. Funduscopic exam benign, without hemorrhages, exudates or papilledema. Vision grossly normal. Ears:     External ear exam shows no significant lesions or deformities.  Otoscopic examination reveals clear canals, tympanic membranes are intact bilaterally without bulging, retraction, inflammation or discharge. Hearing is grossly normal bilaterally. Mouth:     Oral mucosa and oropharynx without lesions or exudates.  Teeth in good repair. Neck:     No deformities, masses, or tenderness noted. Chest Wall:     No deformities, masses, or tenderness noted. Breasts:     skin/areolae normal, no masses, no abnormal thickening, and no nipple discharge.  Mild fibfrocystic changes and tenderness. Lungs:     Normal respiratory effort,  chest expands symmetrically. Lungs are clear to auscultation, no crackles or wheezes. Heart:     Normal rate and regular rhythm. S1 and S2 normal without gallop, murmur, click, rub or other extra sounds. Abdomen:     Bowel sounds positive,abdomen soft and non-tender without masses, organomegaly or hernias noted. Rectal:     anus appeared normal Genitalia:     normal introitus.  Mild erythema with some erosion between labia majora-minora on the left. No vesicular lesions. Msk:     No deformity or scoliosis noted of thoracic or lumbar spine.   Pulses:     R and L carotid,radial,femoral,dorsalis pedis and posterior tibial pulses are full and equal bilaterally Extremities:     No clubbing, cyanosis, edema, or deformity noted with normal full range of motion of all joints.   Neurologic:     No cranial nerve deficits noted. Station and gait are normal. Plantar reflexes are down-going bilaterally. DTRs are symmetrical throughout. Sensory, motor and coordinative functions appear intact. Skin:     Intact without suspicious lesions or rashes Cervical Nodes:     No lymphadenopathy noted Axillary Nodes:     No palpable lymphadenopathy Psych:     Cognition and judgment appear intact. Alert and cooperative with normal attention span and concentration. No apparent delusions, illusions, hallucinations    Impression & Recommendations:  Problem # 1:  Hx of PERIPHERAL NEUROPATHY (ICD-356.9) a recurrent problem, worse now that no longer taking anodyne therapy.  Plan: increase gabapentin to 300mg  two times a day. If still having problems may increase to 300mg  three times a day. We can continue to titrate up the dose to a maximum of 2400 mg daily.  Problem # 2:  PLANTAR FASCIITIS (ICD-728.71) May need to use inserts in shoes. The gabapentin may also help. Her updated medication list for this problem includes:    Meloxicam 15 Mg Tabs (Meloxicam) .Marland Kitchen... Take one tablet daily or as needed   Problem #  3:  HYPERLIPIDEMIA (ICD-272.4)  Her updated medication list for this problem includes:    Simvastatin 40 Mg Tabs (Simvastatin) .Marland Kitchen... Take one tablet once daily  Labs Reviewed: Chol: 140 (08/23/2007)   HDL: 63.4 (08/23/2007)   LDL: 67 (08/23/2007)   TG: 46 (08/23/2007) SGOT: 20 (08/23/2007)   SGPT: 18 (08/23/2007)  Good control. Plan- continue present meds.   Problem # 4:  GASTROESOPHAGEAL REFLUX DISEASE (ICD-530.81) More esophagitis then heartburn.  Her updated medication list for this problem includes:    Omeprazole 40 Mg Cpdr (Omeprazole) .Marland Kitchen... 1 qam as needed dyspepsia  Plan: may use omperazole on an as needed basis.   Problem # 5:  DISC DISEASE, LUMBAR (ICD-722.52) on-going back pain. Unremarkable exam.  Plan: gabapentin increase may help. Resume hydrocodone/APAP 10/325 every  8 hours as needed. Use sparingly. May supplement with APAP 1000mg  but a limit of 3000mg  of APAP a day. Will stop meloxicam due to joint pain, so may use aleve or ibuprofen.  Problem # 6:  HYPERTENSION (ICD-401.9)  Her updated medication list for this problem includes:    Hydrochlorothiazide 25 Mg Tabs (Hydrochlorothiazide) .Marland Kitchen... Take one tablet once daily  BP today: 154/78 Prior BP: 136/72 (10/17/2006)  Labs Reviewed: Creat: 1.0 (08/23/2007) Chol: 140 (08/23/2007)   HDL: 63.4 (08/23/2007)   LDL: 67 (08/23/2007)   TG: 46 (08/23/2007)  Plan: generally better control. Please continue present medications.   Problem # 7:  VULVOVAGINITIS (ICD-616.10) irritation is more likely yeast related. This does not look or act herpetic.  Plan: otc lamisil- a small amount two times a day while irritated. If not better will need to do a skin scraping or refer to dermatology.  Problem # 8:  HEMATOCHEZIA (ICD-578.1) most likely hemorrhoidal.  Plan: will try anusol HC 2.5% suppository two times a day x 6 days. also - refer for colonoscopy. Orders: Gastroenterology Referral (GI)   Problem # 9:  TINNITUS  (ICD-388.30) may  be due to the meloxicam.  Plan: stop the meloxicam and see if there  is improvement. If yes then we will try a different NSAID. If no-resume the meloxicam.  Problem # 10:  Preventive Health Care (ICD-V70.0) Exercise, follow a good and healthy diet. Up to date with mammograms and will be schedule for colonoscopy.  Complete Medication List: 1)  Hydrochlorothiazide 25 Mg Tabs (Hydrochlorothiazide) .... Take one tablet once daily 2)  Gabapentin 300 Mg Tabs (Gabapentin) .... Take one tablet once daily 3)  Omeprazole 40 Mg Cpdr (Omeprazole) .Marland Kitchen.. 1 qam as needed dyspepsia 4)  Simvastatin 40 Mg Tabs (Simvastatin) .... Take one tablet once daily 5)  Meloxicam 15 Mg Tabs (Meloxicam) .... Take one tablet daily or as needed 6)  Amerge 2.5 Mg Tabs (Naratriptan hcl) .... Take as needed 7)  Fluocinonide 0.05 % Gel (Fluocinonide) .... Use as needed 8)  Stool Softener  .... Take as needed 9)  Temazepam 15 Mg Caps (Temazepam) .Marland Kitchen.. 1 by mouth at bedtime prn 10)  Citalopram Hydrobromide 10 Mg Tabs (Citalopram hydrobromide) .... Take 1 tablet by mouth once a day 11)  Anusol-hc 25 Mg Supp (Hydrocortisone acetate) .Marland Kitchen.. 1 pr two times a day x 6 days     Prescriptions: TEMAZEPAM 15 MG  CAPS (TEMAZEPAM) 1 by mouth at bedtime prn  #90 x 3   Entered and Authorized by:   Jacques Navy MD   Signed by:   Jacques Navy MD on 08/30/2007   Method used:   Print then Give to Patient   RxID:   [id number] CITALOPRAM HYDROBROMIDE 10 MG  TABS (CITALOPRAM HYDROBROMIDE) Take 1 tablet by mouth once a day  #90 x 3   Entered and Authorized by:   Jacques Navy MD   Signed by:   Jacques Navy MD on 08/30/2007   Method used:   Print then Give to Patient   RxID:   [id number] AMERGE 2.5 MG  TABS (NARATRIPTAN HCL) Take as needed  #18 x 3   Entered and Authorized by:   Jacques Navy MD   Signed by:   Jacques Navy MD on 08/30/2007   Method used:   Print then Give to Patient   RxID:    [id number] GABAPENTIN 300 MG  TABS (GABAPENTIN) Take one tablet once daily  #90 x  3   Entered and Authorized by:   Jacques Navy MD   Signed by:   Jacques Navy MD on 08/30/2007   Method used:   Print then Give to Patient   RxID:   [id number] ANUSOL-HC 25 MG  SUPP (HYDROCORTISONE ACETATE) 1 pr two times a day x 6 days  #12 x 1   Entered and Authorized by:   Jacques Navy MD   Signed by:   Jacques Navy MD on 08/30/2007   Method used:   Print then Give to Patient   RxID:   [id number]  ]

## 2010-08-12 NOTE — Assessment & Plan Note (Signed)
Summary: discuss meds/#/cd   Vital Signs:  Patient profile:   65 year old female Height:      64 inches Weight:      171 pounds BMI:     29.46 O2 Sat:      97 % on Room air Temp:     98.4 degrees F oral Pulse rate:   77 / minute BP sitting:   132 / 72  (left arm) Cuff size:   large  Vitals Entered By: Ami Bullins CMA (April 17, 2009 2:15 PM)  O2 Flow:  Room air CC: Pt here to discuss medications with MD (simvastatin, meloxicam, and tamazepam), She will get flu shot today/ ab   Primary Care Provider:  Jacques Navy MD  CC:  Pt here to discuss medications with MD (simvastatin, meloxicam, and tamazepam), and She will get flu shot today/ ab.  History of Present Illness: Needs DXA scan.  Temazepam not working - reveiwed records: she has tried trazodone but only for a few days stopped due to listed side effect of tinnitis but no actual symptoms.  Simvastatin - she stopped taking due to body aches. She had a rash with red yeast rice. Wants to try something else. There are no other proved "natural" products.   Migraines - needs new Rx for Amerge, brand name only.  Having increased knee pain: single stepping stairs because of right knee pain. Meloxicam is helping some.  She is also having right ankle pain.   Current Medications (verified): 1)  Neurontin 300 Mg Caps (Gabapentin) .... Take 1 Tablet By Mouth Two Times A Day 2)  Omeprazole 40 Mg  Cpdr (Omeprazole) .Marland Kitchen.. 1 Qam As Needed Dyspepsia 3)  Simvastatin 40 Mg  Tabs (Simvastatin) .... Take 1 Tablet By Mouth Once A Day 4)  Meloxicam 15 Mg  Tabs (Meloxicam) .... Take One Tablet Daily or As Needed 5)  Fluocinonide 0.05 %  Gel (Fluocinonide) .... Use As Needed 6)  Stool Softener .... Take As Needed 7)  Temazepam 15 Mg  Caps (Temazepam) .Marland Kitchen.. 1 By Mouth At Bedtime Prn 8)  Citalopram Hydrobromide 20 Mg Tabs (Citalopram Hydrobromide) .... Once Daily 9)  Anusol-Hc 25 Mg  Supp (Hydrocortisone Acetate) .Marland Kitchen.. 1 Pr Two Times A Day X 6  Days 10)  Amerge 1 Mg Tabs (Naratriptan Hcl) 11)  Robaxin 500 Mg Tabs (Methocarbamol) .... One By Mouth Three Times A Day As Needed Back Spasm 12)  B Complex 50  Tabs (B Complex Vitamins) .... Take 1 Tablet By Mouth Once A Day 13)  Fa-Vitamin B-6-Vitamin B-12 2.2-25-0.5 Mg Tabs (Folic Acid-Vit B6-Vit B12) .... Take 1 Tablet By Mouth Two Times A Day 14)  Ginkgo Biloba 120 Mg Tabs (Ginkgo Biloba) .... Take 1 Tablet By Mouth Once A Day 15)  Tessalon Perles 100 Mg Caps (Benzonatate) .Marland Kitchen.. 1 Three Times A Day As Needed Cough 16)  Flonase 50 Mcg/act Susp (Fluticasone Propionate) .Marland Kitchen.. 1 Spray Each Nostril .qam 17)  Hydrochlorothiazide 25 Mg Tabs (Hydrochlorothiazide) .... Take 1 Tablet By Mouth Once A Day 18)  Hydrocodone-Acetaminophen 10-325 Mg Tabs (Hydrocodone-Acetaminophen) .Marland Kitchen.. 1 By Mouth Q 6 As Needed  Allergies (verified): 1)  ! 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) GASTROESOPHAGEAL REFLUX DISEASE (ICD-530.81) DISC DISEASE, LUMBAR (ICD-722.52) MIGRAINES, HX OF (ICD-V13.8) HYPERTENSION (ICD-401.9) Depression  Past Surgical History: Last updated: 04/05/2007 APPENDECTOMY, HX OF (ICD-V45.79)  OVARIAN CYSTECTOMY, HX OF (ICD-V45.89)    Family History: Last updated: 2008/10/12 father- deceased @ 67: CAd/MI-fatal (3rd) mother -deceased @ 37: old age Family History of Alcoholism/Addiction Neg-colon cancer, DM 2 paternal aunts with breast cancer  Social History: Last updated: 2008/10/12 [employer], [employer] school, AA degree [employer] Married-'64 - 22yrs/divorced; married '98 no children 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  The patient denies anorexia, weight gain, hoarseness, syncope, dyspnea on exertion, prolonged cough, abdominal pain, severe indigestion/heartburn, transient blindness, depression, unusual weight change, abnormal bleeding, and enlarged lymph nodes.    Physical Exam  General:  overweight white female in no distress Head:  Normocephalic and atraumatic without obvious abnormalities. No apparent alopecia or balding. Neck:  supple and full ROM.   Lungs:  normal respiratory effort and normal breath sounds.   Heart:  normal rate, regular rhythm, no murmur, and no JVD.   Abdomen:  soft, non-tender, and normal bowel sounds.   Msk:  normal ROM, no joint tenderness, no joint swelling, and no joint warmth.  Right knee moves freely without click. Negative drawer sign. Right ankle appears normal Pulses:  2+ radial Neurologic:  alert & oriented X3, cranial nerves II-XII intact, and gait normal.   Skin:  turgor normal and color normal.   Psych:  Oriented X3, memory intact for recent and remote, normally interactive, and good eye contact.     Impression & Recommendations:  Problem # 1:  OSTEOARTHROSIS UNSPEC WHETHER GEN/LOC ANK&FOOT (ICD-715.97) on-going and progressive joint pain.   Plan - x-rayknees and ankle for acute changes and to mark baseline for progression.  Her updated medication list for this problem includes:    Meloxicam 15 Mg Tabs (Meloxicam) .Marland Kitchen... Take one tablet daily or as needed    Hydrocodone-acetaminophen 10-325 Mg Tabs (Hydrocodone-acetaminophen) .Marland Kitchen... 1 by mouth q 6 as needed  Orders: T-Ankle Comp Right (73610TC) T-Knee Left 2 view (73560TC) T-Knee Right 2 view (73560TC)    LEFT KNEE - 1-2 VIEW   Comparison: None.   Findings: No evidence for fracture.  No subluxation.  No joint effusion.  Very trace hypertrophic spurring is seen in the patellofemoral compartment without substantial spurring in  the medial or lateral compartments.   IMPRESSION: Minimal spurring in the patellofemoral compartment.  RIGHT ANKLE - COMPLETE 3+ VIEW   Comparison: None.   Findings: No evidence for fracture.  No subluxation or dislocation. No worrisome lytic or sclerotic osseous lesion.   IMPRESSION: No findings to explain the patient's history of pain.    Problem # 2:  MIGRAINES, HX OF (ICD-V13.8) Provided with Rx for Amerge - no substitution/DAW  Problem # 3:  HYPERLIPIDEMIA (ICD-272.4) Patient intolerant of simvastatin  Plan - trial of crestor 5mg  daily. If she develops leg crams or pains reduce dose to MWF           f/u lab in 4-6 weeks.   Her updated medication list for this problem includes:    Simvastatin 40 Mg Tabs (Simvastatin) .Marland Kitchen... Take 1 tablet by mouth once a day  Labs Reviewed: SGOT: 22 (10/03/2008)   SGPT: 21 (10/03/2008)  Lipid Goals: Chol Goal: 200 (06/09/2008)   HDL Goal: 40 (06/09/2008)   LDL Goal: 160 (06/09/2008)   TG Goal: 150 (06/09/2008)  Prior 10 Yr  Risk Heart Disease: 5 % (06/09/2008)   HDL:67.90 (10/03/2008), 87.7 (03/26/2008)  LDL:86 (10/03/2008), 57 (03/26/2008)  Chol:168 (10/03/2008), 153 (03/26/2008)  Trig:69.0 (10/03/2008), 40 (03/26/2008)  Problem # 4:  INSOMNIA, CHRONIC (ICD-307.42) Patinet with long standing sleep disorder. She has failed zolpidem in the past. She is intolerant of temazepam and it is ineffective.  Plan - rechalleng with trazodone 50mg  at bedtime.  Problem # 5:  HYPERTENSION (ICD-401.9)  Her updated medication list for this problem includes:    Hydrochlorothiazide 25 Mg Tabs (Hydrochlorothiazide) .Marland Kitchen... Take 1 tablet by mouth once a day  BP today: 132/72 Prior BP: 138/82 (01/05/2009)  Prior 10 Yr Risk Heart Disease: 5 % (06/09/2008)  Labs Reviewed: K+: 3.9 (10/03/2008) Creat: : 0.9 (10/03/2008)   Chol: 168 (10/03/2008)   HDL: 67.90 (10/03/2008)   LDL: 86 (10/03/2008)   TG: 69.0 (10/03/2008)  Adequate control on present  medications. Will continue the same.   Complete Medication List: 1)  Neurontin 300 Mg Caps (Gabapentin) .... Take 1 tablet by mouth two times a day 2)  Omeprazole 40 Mg Cpdr (Omeprazole) .Marland Kitchen.. 1 qam as needed dyspepsia 3)  Simvastatin 40 Mg Tabs (Simvastatin) .... Take 1 tablet by mouth once a day 4)  Meloxicam 15 Mg Tabs (Meloxicam) .... Take one tablet daily or as needed 5)  Fluocinonide 0.05 % Gel (Fluocinonide) .... Use as needed 6)  Stool Softener  .... Take as needed 7)  Temazepam 15 Mg Caps (Temazepam) .Marland Kitchen.. 1 by mouth at bedtime prn 8)  Citalopram Hydrobromide 20 Mg Tabs (Citalopram hydrobromide) .... Once daily 9)  Anusol-hc 25 Mg Supp (Hydrocortisone acetate) .Marland Kitchen.. 1 pr two times a day x 6 days 10)  Amerge 1 Mg Tabs (Naratriptan hcl) 11)  Robaxin 500 Mg Tabs (Methocarbamol) .... One by mouth three times a day as needed back spasm 12)  B Complex 50 Tabs (B complex vitamins) .... Take 1 tablet by mouth once a day 13)  Fa-vitamin B-6-vitamin B-12 2.2-25-0.5 Mg Tabs (Folic acid-vit b6-vit b12) .... Take 1 tablet by mouth two times a day 14)  Ginkgo Biloba 120 Mg Tabs (Ginkgo biloba) .... Take 1 tablet by mouth once a day 15)  Tessalon Perles 100 Mg Caps (Benzonatate) .Marland Kitchen.. 1 three times a day as needed cough 16)  Flonase 50 Mcg/act Susp (Fluticasone propionate) .Marland Kitchen.. 1 spray each nostril .qam 17)  Hydrochlorothiazide 25 Mg Tabs (Hydrochlorothiazide) .... Take 1 tablet by mouth once a day 18)  Hydrocodone-acetaminophen 10-325 Mg Tabs (Hydrocodone-acetaminophen) .Marland Kitchen.. 1 by mouth q 6 as needed Prescriptions: AMERGE 1 MG TABS (NARATRIPTAN HCL)  Brand medically necessary #18 x 3   Entered and Authorized by:   Jacques Navy MD   Signed by:   Jacques Navy MD on 04/17/2009   Method used:   Print then Give to Patient   RxID:   [id number] AMERGE 1 MG TABS (NARATRIPTAN HCL)   #18 x 3   Entered and Authorized by:   Jacques Navy MD   Signed by:   Jacques Navy MD on 04/17/2009    Method used:   Print then Give to Patient   RxID:   [id number]    Preventive Care Screening  Mammogram:    Date:  08/08/2008    Results:  normal   Colonoscopy:    Date:  09/13/2007    Results:  normal

## 2010-08-12 NOTE — Miscellaneous (Signed)
Summary: Orders Update  Clinical Lists Changes  Problems: Added new problem of CAROTID ARTERY DISEASE (ICD-433.10) Orders: Added new Test order of Carotid Duplex (Carotid Duplex) - Signed 

## 2010-08-12 NOTE — Letter (Signed)
   [facility] [address] [place], [place]  [address] Phone: [phone]      August 30, 2007   PERI KREFT [address] [place], [place] [address]  RE:  LAB RESULTS  Dear  Ms. Whetsell,  The following is an interpretation of your most recent lab tests.  Please take note of any instructions provided or changes to medications that have resulted from your lab work.  ELECTROLYTES:  Good - no changes needed  KIDNEY FUNCTION TESTS:  Good - no changes needed  LIVER FUNCTION TESTS:  Good - no changes needed  LIPID PANEL:  Good - no changes needed Triglyceride: 46   Cholesterol: 140   LDL: 67   HDL: 63.4   Chol/HDL%:  2.2 CALC  THYROID STUDIES:  Thyroid studies normal TSH: 1.11     DIABETIC STUDIES:  Good - no changes needed Blood Glucose: 106     CBC:  Good - no changes needed  Great labs.  Call or e-mail me if you have questions ([email or web address])    Sincerely Yours,    Jacques Navy MD

## 2010-08-12 NOTE — Progress Notes (Signed)
  Phone Note Outgoing Call   Reason for Call: Discuss lab or test results Summary of Call: Please call: normal kidney function on lab study. Continue to use knee high support hose.   Thanks Initial call taken by: Jacques Navy MD,  March 21, 2010 9:43 PM  Follow-up for Phone Call        lmoam for pt to call back Follow-up by: Ami Bullins CMA,  March 22, 2010 1:47 PM  Additional Follow-up for Phone Call Additional follow up Details #1::        lmoam Additional Follow-up by: Ami Bullins CMA,  March 23, 2010 11:04 AM    Additional Follow-up for Phone Call Additional follow up Details #2::    left mess to call office back..............Marland KitchenLamar Sprinkles, CMA  March 23, 2010 6:14 PM   Pt informed  Follow-up by: Lamar Sprinkles, CMA,  March 23, 2010 6:15 PM

## 2010-08-12 NOTE — Assessment & Plan Note (Signed)
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  History of Present Illness: Patient presents for routine medical follow-up. she is feeling OK - mostly ok for her age, she says. No major illness or surgery or injury.   Current Medications (verified): 1)  Neurontin 300 Mg Caps (Gabapentin) .... Take 1 Tablet By Mouth Two Times A Day 2)  Omeprazole 40 Mg  Cpdr (Omeprazole) .Marland Kitchen.. 1 Qam As Needed Dyspepsia 3)  Meloxicam 15 Mg  Tabs (Meloxicam) .... Take One Tablet Daily or As Needed 4)  Fluocinonide 0.05 %  Gel (Fluocinonide) .... Use As Needed 5)  Stool Softener .... Take As Needed 6)  Temazepam 15 Mg  Caps (Temazepam) .Marland Kitchen.. 1 By Mouth At Bedtime Prn 7)  Citalopram Hydrobromide 20 Mg Tabs (Citalopram Hydrobromide) .... Once Daily 8)  Anusol-Hc 25 Mg  Supp (Hydrocortisone Acetate) .Marland Kitchen.. 1 Pr Two Times A Day X 6 Days 9)  Amerge 1 Mg Tabs (Naratriptan Hcl) .Marland Kitchen.. 1 As Needed H/a, May Repeat One More in 2 Hours. Max 2 Tabs in 24 Hours 10)  Robaxin 500 Mg Tabs (Methocarbamol) .... One By Mouth Three Times A Day As Needed Back Spasm 11)  Fa-Vitamin B-6-Vitamin B-12 2.2-25-0.5 Mg Tabs (Folic Acid-Vit B6-Vit B12) .... Take 1 Tablet By Mouth Once Daily 12)  Tessalon Perles 100 Mg Caps (Benzonatate) .Marland Kitchen.. 1 Three Times A Day As Needed Cough 13)  Flonase 50 Mcg/act Susp (Fluticasone Propionate) .Marland Kitchen.. 1 Spray Each Nostril  Every Morning As Needed 14)  Hydrochlorothiazide 25 Mg Tabs (Hydrochlorothiazide)  .... Take 1 Tablet By Mouth Once A Day 15)  Hydrocodone-Acetaminophen 10-325 Mg Tabs (Hydrocodone-Acetaminophen) .Marland Kitchen.. 1 By Mouth Q 6 As Needed 16)  Aspirin 81 Mg Tbec (Aspirin) .... Take One Tablet By Mouth Daily 17)  Crestor 5 Mg Tabs (Rosuvastatin Calcium) .... Take One Tablet 3 Days A Week  Allergies (verified): 1)  ! 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 Doppler.... September 10, 2009.... 0-39% RICA, 60-79% LICA Chest pain...Marland KitchenMarland KitchenNuclear stress... 2006... normal Statin therapy.... muscle aches from simvastatin and 5 mg Crestor-qod   Past Surgical History: Reviewed history from 04/05/2007 and no changes required. APPENDECTOMY, HX OF (ICD-V45.79) OVARIAN CYSTECTOMY, HX OF (ICD-V45.89)    Family History: Reviewed history from 10/09/2008 and no changes required. father- deceased @ 12: CAd/MI-fatal (3rd) mother -deceased @ 12: old age Family History of Alcoholism/Addiction Neg-colon cancer, DM 2 paternal  aunts with breast cancer  Social History: Reviewed history from 10/09/2008 and no changes required. 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    Physical Exam  General:  WNWD White female no distress Head:  Normocephalic and atraumatic without obvious abnormalities. No apparent alopecia or balding. Eyes:  No corneal or conjunctival inflammation noted. EOMI. Perrla. Funduscopic exam benign, without hemorrhages, exudates or papilledema. Vision grossly normal. Ears:  External ear exam shows no significant lesions or deformities.  Otoscopic examination reveals clear canals, tympanic membranes are intact bilaterally without bulging, retraction, inflammation or discharge. Hearing is grossly normal bilaterally. Nose:  no external deformity, no external erythema, and no nasal discharge.   Mouth:  Oral mucosa and oropharynx without lesions or exudates.  Teeth in good repair. Neck:  supple, full ROM, no thyromegaly, and no carotid bruits.   Chest Wall:  no deformities and no tenderness.   Breasts:  No mass, nodules, thickening, tenderness, bulging, retraction, inflamation, nipple discharge or skin changes noted.   Lungs:  Normal respiratory effort, chest expands symmetrically. Lungs are clear to auscultation, no crackles or wheezes. Heart:  Normal rate and regular rhythm. S1 and S2 normal without gallop, murmur, click, rub or other extra sounds. Abdomen:  soft, non-tender, normal bowel sounds, no distention, no masses, no abdominal hernia, and no hepatomegaly.     Genitalia:  deferred to normal exam last month Msk:  normal ROM, no joint tenderness, no joint swelling, no joint warmth, and no joint instability.   Pulses:  2+ radial and DP Extremities:  No clubbing, cyanosis, edema, or deformity noted with normal full range of motion of all joints.   Neurologic:  alert & oriented X3, cranial nerves II-XII intact, strength normal in all extremities, gait normal, and DTRs symmetrical and normal.   Skin:  turgor normal, color normal, no ecchymoses, and no ulcerations.   Cervical Nodes:  no anterior cervical adenopathy and no posterior cervical adenopathy.   Axillary Nodes:  no R axillary adenopathy and no L axillary adenopathy.   Psych:  Oriented X3, memory intact for recent and remote, normally interactive, good eye contact, and not anxious appearing.     Impression & Recommendations:  Problem # 1:  FIBROMYALGIA (ICD-729.1) Patient is currently doing OK on her present medical regimen.  Her updated medication list for this problem includes:    Meloxicam 15 Mg Tabs (Meloxicam) .Marland Kitchen... Take one tablet daily or as needed    Robaxin 500 Mg Tabs (Methocarbamol) ..... One by mouth three times a day as needed back spasm    Hydrocodone-acetaminophen 10-325 Mg Tabs (Hydrocodone-acetaminophen) .Marland Kitchen... 1 by mouth q 6 as needed    Aspirin 81 Mg Tbec (Aspirin) .Marland Kitchen... Take one tablet  by mouth daily  Problem # 2:  HYPERLIPIDEMIA (ICD-272.4)  Her updated medication list for this problem includes:    Crestor 5 Mg Tabs (Rosuvastatin calcium) ..... Every other day  Prior 10 Yr Risk Heart Disease: 5 % (06/09/2008)   HDL:67.00 (12/18/2009), 66.70 (05/07/2009)  LDL:95 (12/18/2009), 77 (05/07/2009)  Chol:179 (12/18/2009), 154 (05/07/2009)  Trig:85.0 (12/18/2009), 53.0 (05/07/2009)  Patient is at goal on every other day crestor and has few side effects.  Plan - continue present meds.  Problem # 3:  GASTROESOPHAGEAL REFLUX DISEASE (ICD-530.81) symptoms are well controlled    Her updated medication list for this problem includes:    Omeprazole 40 Mg Cpdr (Omeprazole) ..... Every other am as needed dyspepsia  Problem # 4:  MIGRAINES, HX OF (ICD-V13.8) She has recurrent migraines but gets good control with amerge.  Plan - will renew Rx as needed.  Problem # 5:  HYPERTENSION (ICD-401.9)  Her updated medication list for this problem includes:    Hydrochlorothiazide 25 Mg Tabs (Hydrochlorothiazide) .Marland Kitchen... Take 1 tablet by mouth once a day  BP today: 122/80 Prior BP: 164/88 (11/12/2009)  Prior 10 Yr Risk Heart Disease: 5 % (06/09/2008)  Labs Reviewed: K+: 4.6 (12/18/2009) Creat: : 0.8 (12/18/2009)     Good control on low dose diuretic therapy. Will continue the same.  Problem # 6:  Preventive Health Care (ICD-V70.0) Unremarkable interval history. Normal exam. She is not due for PAP this year. She is current with mammography-09/03/09. She had colonoscopy '09.  She had tetnus booster and zostavax '09. She remains independent in all ADLs.  In summary - a very nice woman with multiple medical problems who is stable at today's exam. She will return in 6 months or as needed.   Complete Medication List: 1)  Neurontin 300 Mg Caps (Gabapentin) .... Take 1 tablet by mouth two times a day 2)  Omeprazole 40 Mg Cpdr (Omeprazole) .... Every other am as needed dyspepsia 3)  Meloxicam 15 Mg Tabs (Meloxicam) .... Take one tablet daily or as needed 4)  Fluocinonide 0.05 % Gel (Fluocinonide) .... Use as needed 5)  Stool Softener  .... Take as needed 6)  Temazepam 15 Mg Caps (Temazepam) .Marland Kitchen.. 1 by mouth at bedtime prn 7)  Citalopram Hydrobromide 20 Mg Tabs (Citalopram hydrobromide) .... Once daily 8)  Anusol-hc 25 Mg Supp (Hydrocortisone acetate) .Marland Kitchen.. 1 pr two times a day x 6 days 9)  Amerge 1 Mg Tabs (Naratriptan hcl) .Marland Kitchen.. 1 as needed h/a, may repeat one more in 2 hours. max 2 tabs in 24 hours 10)  Robaxin 500 Mg Tabs (Methocarbamol) .... One by mouth three times a day as  needed back spasm 11)  Fa-vitamin B-6-vitamin B-12 2.2-25-0.5 Mg Tabs (Folic acid-vit b6-vit b12) .... Take 1 tablet by mouth once daily 12)  Tessalon Perles 100 Mg Caps (Benzonatate) .Marland Kitchen.. 1 three times a day as needed cough prn 13)  Flonase 50 Mcg/act Susp (Fluticasone propionate) .Marland Kitchen.. 1 spray each nostril  every morning as needed 14)  Hydrochlorothiazide 25 Mg Tabs (Hydrochlorothiazide) .... Take 1 tablet by mouth once a day 15)  Hydrocodone-acetaminophen 10-325 Mg Tabs (Hydrocodone-acetaminophen) .Marland Kitchen.. 1 by mouth q 6 as needed 16)  Aspirin 81 Mg Tbec (Aspirin) .... Take one tablet by mouth daily 17)  Crestor 5 Mg Tabs (Rosuvastatin calcium) .... Every other day  Patient: [name] Note: All result statuses are Final unless otherwise noted.  Tests: (1) BMP (METABOL)   Sodium  141 mEq/L                   135-145   Potassium                 4.6 mEq/L                   3.5-5.1   Chloride                  106 mEq/L                   96-112   Carbon Dioxide            32 mEq/L                    19-32   Glucose                   90 mg/dL                    52-84   BUN                       17 mg/dL                    1-32   Creatinine                0.8 mg/dL                   4.4-0.1   Calcium                   9.3 mg/dL                   0.2-72.5   GFR                       76.55 mL/min                >60  Tests: (2) CBC Platelet w/Diff (CBCD)   White Cell Count          4.8 K/uL                    4.5-10.5   Red Cell Count            4.33 Mil/uL                 3.87-5.11   Hemoglobin                13.5 g/dL                   36.6-44.0   Hematocrit                38.9 %                      36.0-46.0   MCV                       89.8 fl                     78.0-100.0   MCHC                      34.6 g/dL  30.0-36.0   RDW                       13.6 %                      11.5-14.6   Platelet Count            229.0 K/uL                   150.0-400.0   Neutrophil %              58.0 %                      43.0-77.0   Lymphocyte %              29.9 %                      12.0-46.0   Monocyte %                7.6 %                       3.0-12.0   Eosinophils%              4.3 %                       0.0-5.0   Basophils %               0.2 %                       0.0-3.0   Neutrophill Absolute      2.8 K/uL                    1.4-7.7   Lymphocyte Absolute       1.4 K/uL                    0.7-4.0   Monocyte Absolute         0.4 K/uL                    0.1-1.0  Eosinophils, Absolute                             0.2 K/uL                    0.0-0.7   Basophils Absolute        0.0 K/uL                    0.0-0.1  Tests: (3) Hepatic/Liver Function Panel (HEPATIC)   Total Bilirubin           0.7 mg/dL                   1.6-1.0   Direct Bilirubin          0.0 mg/dL                   9.6-0.4   Alkaline Phosphatase      65 U/L                      39-117   AST  22 U/L                      0-37   ALT                       18 U/L                      0-35   Total Protein             6.1 g/dL                    0.9-8.1   Albumin                   3.8 g/dL                    1.9-1.4  Tests: (4) TSH (TSH)   FastTSH                   1.16 uIU/mL                 0.35-5.50  Tests: (5) Lipid Panel (LIPID)   Cholesterol               179 mg/dL                   7-829     ATP III Classification            Desirable:  < 200 mg/dL                    Borderline High:  200 - 239 mg/dL               High:  > = 240 mg/dL   Triglycerides             85.0 mg/dL                  5.6-213.0     Normal:  <150 mg/dL     Borderline High:  865 - 199 mg/dL   HDL                       78.46 mg/dL                 >96.29   VLDL Cholesterol          17.0 mg/dL                  5.2-84.1   LDL Cholesterol           95 mg/dL                    3-24  CHO/HDL Ratio:  CHD Risk                             3                    Men           Women     1/2 Average Risk     3.4          3.3     Average Risk          5.0          4.4     2X Average Risk  9.6          7.1     3X Average Risk          15.0          11.0                           Tests: (6) UDip Only (UDIP)   Color                     YELLOW       RANGE:  Yellow;Lt. Yellow   Clarity                   CLEAR                       Clear   Specific Gravity          1.020                       1.000 - 1.030   Urine Ph                  7.0                         5.0-8.0   Protein                   NEGATIVE                    Negative   Urine Glucose             NEGATIVE                    Negative   Ketones                   NEGATIVE                    Negative   Urine Bilirubin           NEGATIVE                    Negative   Blood                     NEGATIVE                    Negative   Urobilinogen              0.2                         0.0 - 1.0   Leukocyte Esterace        NEGATIVE                    Negative   Nitrite                   NEGATIVE                    NegativePrescriptions: HYDROCODONE-ACETAMINOPHEN 10-325 MG TABS (HYDROCODONE-ACETAMINOPHEN) 1 by mouth q 6 as needed  #90 x 3   Entered and Authorized by:   Jacques Navy MD   Signed by:   Jacques Navy MD on 12/25/2009   Method used:   Print then  Give to Patient   RxID:   [id number] TEMAZEPAM 15 MG  CAPS (TEMAZEPAM) 1 by mouth at bedtime prn  #90 x 3   Entered and Authorized by:   Jacques Navy MD   Signed by:   Jacques Navy MD on 12/25/2009   Method used:   Print then Give to Patient   RxID:   [id number] CRESTOR 5 MG TABS (ROSUVASTATIN CALCIUM) every other day  #45 x 3   Entered and Authorized by:   Jacques Navy MD   Signed by:   Jacques Navy MD on 12/25/2009   Method used:   Electronically to        [id number] (retail)       [address]       [place], [place]  [address]       Ph: [phone] or [phone]        Fax: [phone]   RxID:   [id number] HYDROCHLOROTHIAZIDE 25 MG TABS (HYDROCHLOROTHIAZIDE) Take 1 tablet by mouth once a day  #90 x 3   Entered and Authorized by:   Jacques Navy MD   Signed by:   Jacques Navy MD on 12/25/2009   Method used:   Electronically to        [facility] [address] (retail)       [address]       [place], [place]  [address]       Ph: [phone] or [phone]       Fax: [phone]   RxID:   [id number] FLONASE 50 MCG/ACT SUSP (FLUTICASONE PROPIONATE) 1 spray each nostril  every morning as needed  #3 x 3   Entered and Authorized by:   Jacques Navy MD   Signed by:   Jacques Navy MD on 12/25/2009   Method used:   Electronically to        [facility] [id number] ([facility])       [address]       [place], [place]  [address]       Ph: [phone] or [phone]       Fax: [phone]   RxID:   [id number] ROBAXIN 500 MG TABS (METHOCARBAMOL) one by mouth three times a day as needed back spasm  #35 x 6   Entered and Authorized by:   Jacques Navy MD   Signed by:   Jacques Navy MD on 12/25/2009   Method used:   Electronically to        [facility] [address] ([facility])       [address]       [place], [place]  [address]       Ph: [phone] or [phone]       Fax: [phone]   RxID:   [id number] AMERGE 1 MG TABS (NARATRIPTAN HCL) 1 as needed h/a, may repeat one more in 2 hours. MAX 2 tabs in 24 hours  #18 x 3   Entered and Authorized by:   Jacques Navy MD   Signed by:   Jacques Navy MD on 12/25/2009   Method used:   Electronically to        [facility]  [address] [phone][facility] (retail)       [address]  [place], [place]  [address]       Ph: [phone] or [phone]       Fax: [phone]   RxID:   [id number] ANUSOL-HC 25 MG  SUPP (HYDROCORTISONE ACETATE) 1 pr two times a day x 6 days  #12 x 1   Entered and  Authorized by:   Jacques Navy MD   Signed by:   Jacques Navy MD on 12/25/2009   Method used:   Electronically to        [id number] ([facility])       [address]       [place], [place]  [address]       Ph: [phone] or [phone]       Fax: [phone]   RxID:   [id number] CITALOPRAM HYDROBROMIDE 20 MG TABS (CITALOPRAM HYDROBROMIDE) once daily  #90 x 3   Entered and Authorized by:   Jacques Navy MD   Signed by:   Jacques Navy MD on 12/25/2009   Method used:   Electronically to        [id number] ([facility])       [address]       [place], [place]  [address]       Ph: [phone] or [phone]       Fax: [phone]   RxID:   [id number] FLUOCINONIDE 0.05 %  GEL (FLUOCINONIDE) use as needed  #15mg  x 2   Entered and Authorized by:   Jacques Navy MD   Signed by:   Jacques Navy MD on 12/25/2009   Method used:   Electronically to        [id number] ([facility])       [address]       [place], [place]  [address]       Ph: [phone] or [phone]       Fax: [phone]   RxID:   [id number] MELOXICAM 15 MG  TABS (MELOXICAM) Take one tablet daily or as needed  #90 x 3   Entered and Authorized by:   Jacques Navy MD   Signed by:   Jacques Navy MD on 12/25/2009   Method used:   Electronically to        [facility] ([facility])       [address]       [place], [place]  [address]       Ph: [phone] or [phone]       Fax: [phone]   RxID:   [id number] OMEPRAZOLE 40 MG  CPDR (OMEPRAZOLE) Every other AM as needed dyspepsia  #30 x 12   Entered and Authorized by:   Jacques Navy MD   Signed by:   Jacques Navy MD on 12/25/2009   Method used:   Electronically to        [facility] ([facility])       [address]       [place], [place]  [address]       Ph:  [phone] or [phone]       Fax: [phone]  RxID:   [id number] NEURONTIN 300 MG CAPS (GABAPENTIN) Take 1 tablet by mouth two times a day  #180 x 12   Entered and Authorized by:   Jacques Navy MD   Signed by:   Jacques Navy MD on 12/25/2009   Method used:   Electronically to        [facility] ([facility]       [address]       [place], [place]  [address]       Ph: [phone] or [phone]       Fax: [phone]   RxID:   [id number]    Preventive Care Screening  Mammogram:    Date:  09/03/2009    Results:  normal

## 2010-08-12 NOTE — Assessment & Plan Note (Signed)
Summary: left breast pain following chiropractic visit-lb   Vital Signs:  Patient profile:   65 year old female Height:      64 inches Weight:      175 pounds BMI:     30.15 O2 Sat:      97 % on Room air Temp:     98.5 degrees F oral Pulse rate:   67 / minute BP sitting:   162 / 90  (left arm) Cuff size:   regular  Vitals Entered By: Bill Salinas CMA (July 19, 2010 9:43 AM)  O2 Flow:  Room air CC: pt here with c/o left breast pain after seeing the chiropractor on wens of last week/ab   Primary Care Provider:  Jacques Navy MD  CC:  pt here with c/o left breast pain after seeing the chiropractor on wens of last week/ab.  History of Present Illness: Mrs. Chiang had a chiropractic adjustment last Friday: she was in the lateral decubitus position - left side down and had an adjustment and she immediately had pain in the left axillary region. Since that time she has continued to have a lot of pain in the left side. She thought it might be her breast vs chest wall. she has to support her breast with rolling over on her side or getting up. she has had no respiratory compromise, no fever or chills, no cough. She has not seen any bruising. She had a normal mammogram this past year.   Current Medications (verified): 1)  Neurontin 300 Mg Caps (Gabapentin) .... Take 1 Tablet By Mouth Two Times A Day 2)  Omeprazole 40 Mg  Cpdr (Omeprazole) .... Every Other Am As Needed Dyspepsia 3)  Meloxicam 15 Mg  Tabs (Meloxicam) .... Take One Tablet Daily or As Needed 4)  Fluocinonide 0.05 %  Gel (Fluocinonide) .... Use As Needed 5)  Stool Softener .... Take As Needed 6)  Temazepam 15 Mg  Caps (Temazepam) .Marland Kitchen.. 1 By Mouth At Bedtime Prn 7)  Citalopram Hydrobromide 20 Mg Tabs (Citalopram Hydrobromide) .... Once Daily 8)  Anusol-Hc 25 Mg  Supp (Hydrocortisone Acetate) .Marland Kitchen.. 1 Pr Two Times A Day X 6 Days 9)  Amerge 1 Mg Tabs (Naratriptan Hcl) .Marland Kitchen.. 1 As Needed H/a, May Repeat One More in 2 Hours. Max 2  Tabs in 24 Hours 10)  Robaxin 500 Mg Tabs (Methocarbamol) .... One By Mouth Three Times A Day As Needed Back Spasm 11)  Fa-Vitamin B-6-Vitamin B-12 2.2-25-0.5 Mg Tabs (Folic Acid-Vit B6-Vit B12) .... Take 1 Tablet By Mouth Once Daily 12)  Tessalon Perles 100 Mg Caps (Benzonatate) .Marland Kitchen.. 1 Three Times A Day As Needed Cough Prn 13)  Flonase 50 Mcg/act Susp (Fluticasone Propionate) .Marland Kitchen.. 1 Spray Each Nostril  Every Morning As Needed 14)  Hydrochlorothiazide 25 Mg Tabs (Hydrochlorothiazide) .... Take 1 Tablet By Mouth Once A Day 15)  Hydrocodone-Acetaminophen 10-325 Mg Tabs (Hydrocodone-Acetaminophen) .Marland Kitchen.. 1 By Mouth Q 6 As Needed 16)  Aspirin 81 Mg Tbec (Aspirin) .... Take One Tablet By Mouth Daily 17)  Crestor 5 Mg Tabs (Rosuvastatin Calcium) .... Every Other Day  Allergies (verified): 1)  ! 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 Doppler.... September 10, 2009.... 0-39% RICA, 60-79% LICA Chest pain...Marland KitchenMarland KitchenNuclear stress... 2006... normal Statin therapy.... muscle aches  from simvastatin and 5 mg Crestor-qod   Past Surgical History: Last updated: 04/05/2007 APPENDECTOMY, HX OF (ICD-V45.79) OVARIAN CYSTECTOMY, HX OF (ICD-V45.89)   FH reviewed for relevance, SH/Risk Factors reviewed for relevance  Review of Systems       The patient complains of chest pain.  The patient denies anorexia, fever, weight loss, weight gain, decreased hearing, syncope, dyspnea on exertion, prolonged cough, abdominal pain, muscle weakness, suspicious skin lesions, and enlarged lymph nodes.    Physical Exam  General:  WNWD white woman in no acute distress who is able to  move about the exam room without assistance Head:  normocephalic and atraumatic.   Eyes:  pupils equal and pupils round.   Chest Wall:  very tender to palpation in the anterior axillary line left and at the left upper lateral chest.  Breasts:  left breast appears normal with no skin change, no dimpling or assymetry. No mass on palpation of the upper and outer quadrants.  Lungs:  normal respiratory effort and normal breath sounds.   Heart:  normal rate and regular rhythm.   Pulses:  2+ radial Neurologic:  alert & oriented X3, cranial nerves II-XII intact, and gait normal.   Skin:  turgor normal, color normal, no rashes, and no suspicious lesions.  No bruising of the breast is noted Psych:  Oriented X3, normally interactive, good eye contact, and not anxious appearing.     Impression & Recommendations:  Problem # 1:  CHEST WALL PAIN, ACUTE (GNF-621.30) Patient with acute on-set chest wall pain at the time of chiropractic adjustment. No limitation in range of motion. No bruising of breast or side. suspect acute muscle injiury.  Plan - CXR with rib details to r/o fracture           heat           continue meloxicam           may take add'l APAP or hydrocodone if necessary.   Her updated medication list for this problem includes:    Meloxicam 15 Mg Tabs (Meloxicam) .Marland Kitchen... Take one tablet daily or as needed  Addendum - cxr and rib details normal  Plan - conservative therapy for muscle strain: heat, NSIADs.     Aspirin 81 Mg Tbec (Aspirin) .Marland Kitchen... Take one tablet by mouth daily  Orders: T-2 View CXR (71020TC) T-Ribs Unilateral 2 Views (71100TC)  Complete Medication List: 1)  Neurontin 300 Mg Caps (Gabapentin) .... Take 1 tablet by mouth two times a day 2)  Omeprazole 40 Mg Cpdr (Omeprazole) .... Every other am as needed dyspepsia 3)  Meloxicam 15 Mg Tabs (Meloxicam) .... Take one tablet daily or as needed 4)  Fluocinonide 0.05 % Gel (Fluocinonide) .... Use as needed 5)  Stool Softener   .... Take as needed 6)  Temazepam 15 Mg Caps (Temazepam) .Marland Kitchen.. 1 by mouth at bedtime prn 7)  Citalopram Hydrobromide 20 Mg Tabs (Citalopram hydrobromide) .... Once daily 8)  Anusol-hc 25 Mg Supp (Hydrocortisone acetate) .Marland Kitchen.. 1 pr two times a day x 6 days 9)  Amerge 1 Mg Tabs (Naratriptan hcl) .Marland Kitchen.. 1 as needed h/a, may repeat one more in 2 hours. max 2 tabs in 24 hours 10)  Robaxin 500 Mg Tabs (Methocarbamol) .... One by mouth three times a day as needed back spasm 11)  Fa-vitamin B-6-vitamin B-12 2.2-25-0.5 Mg Tabs (Folic acid-vit b6-vit b12) .... Take 1 tablet by mouth once daily 12)  Tessalon Perles 100 Mg Caps (Benzonatate) .Marland Kitchen.. 1 three times  a day as needed cough prn 13)  Flonase 50 Mcg/act Susp (Fluticasone propionate) .Marland Kitchen.. 1 spray each nostril  every morning as needed 14)  Hydrochlorothiazide 25 Mg Tabs (Hydrochlorothiazide) .... Take 1 tablet by mouth once a day 15)  Hydrocodone-acetaminophen 10-325 Mg Tabs (Hydrocodone-acetaminophen) .Marland Kitchen.. 1 by mouth q 6 as needed 16)  Aspirin 81 Mg Tbec (Aspirin) .... Take one tablet by mouth daily 17)  Crestor 5 Mg Tabs (Rosuvastatin calcium) .... Every other day   Orders Added: 1)  T-2 View CXR [71020TC] 2)  T-Ribs Unilateral 2 Views [71100TC] 3)  New Patient Level III [04540]

## 2010-08-12 NOTE — Assessment & Plan Note (Signed)
Summary: 2 wk fu/per Dr. Josetta Huddle   Vital Signs:  Patient Profile:   65 Years Old Female Height:     64 inches Weight:      169.38 pounds BMI:     29.18 Temp:     97.8 degrees F oral Pulse rate:   73 / minute BP sitting:   132 / 84  (left arm)  Vitals Entered By: Lamar Sprinkles (June 09, 2008 8:21 AM)                 PCP:  Jacques Navy MD  Chief Complaint:  F/U & Back Pain .  History of Present Illness: Pt. returns for f/up but has several other issues... 1. She strained her mid-back 1 week ago while working on a ceiling and needs a refill on Robaxin. She has a hx. of DDD and bulging discs in her T-spine and says these complaints are not new or different. She has soreness in her mid-spine without any radiation.  2. She did not keep taking trazadone b/c she read that it can cause tinnitus so she stopped after a few doses. She did not experience any side-effects though. She wants a refill on temazepam for persistent insomnia.  3. She is doing well on BenicarHCT.  4. She wants to update her tetanus vaccine and get a Shingles vaccine.  Depression History:      The patient denies a depressed mood most of the day and a diminished interest in her usual daily activities.  Positive alarm features for depression include significant weight gain and insomnia.  However, she denies significant weight loss, hypersomnia, psychomotor agitation, psychomotor retardation, fatigue (loss of energy), feelings of worthlessness (guilt), impaired concentration (indecisiveness), and recurrent thoughts of death or suicide.        Psychosocial stress factors include a recent traumatic event.  Risk factors for depression include a personal history of depression.  The patient denies that she feels like life is not worth living, denies that she wishes that she were dead, and denies that she has thought about ending her life.         Depression Treatment History:  Prior Medication Used:   Start  Date: Assessment of Effect:   Comments:  Celexa (citalopram)     01/24/2006   some improvement     -- trazodone (generic)     04/23/2008   side effects enough to stop   --  Hypertension History:      She denies headache, chest pain, palpitations, dyspnea with exertion, orthopnea, PND, peripheral edema, visual symptoms, neurologic problems, syncope, and side effects from treatment.  She notes no problems with any antihypertensive medication side effects.        Positive major cardiovascular risk factors include female age 55 years old or older, hyperlipidemia, and hypertension.  Negative major cardiovascular risk factors include no history of diabetes, negative family history for ischemic heart disease, and non-tobacco-user status.        Further assessment for target organ damage reveals no history of ASHD, cardiac end-organ damage (CHF/LVH), stroke/TIA, peripheral vascular disease, renal insufficiency, or hypertensive retinopathy.    Lipid Management History:      Positive NCEP/ATP III risk factors include female age 85 years old or older and hypertension.  Negative NCEP/ATP III risk factors include no history of early menopause without estrogen hormone replacement, non-diabetic, HDL cholesterol greater than 60, no family history for ischemic heart disease, non-tobacco-user status, no ASHD (atherosclerotic heart disease), no prior stroke/TIA,  no peripheral vascular disease, and no history of aortic aneurysm.        The patient states that she knows about the "Therapeutic Lifestyle Change" diet.  Her compliance with the TLC diet is fair.  The patient expresses understanding of adjunctive measures for cholesterol lowering.  Adjunctive measures started by the patient include aerobic exercise, fiber, limit alcohol consumpton, and weight reduction.         Updated Prior Medication List: BENICAR HCT 40-25 MG TABS (OLMESARTAN MEDOXOMIL-HCTZ) qd GABAPENTIN 300 MG  TABS (GABAPENTIN) Take one tablet once  daily OMEPRAZOLE 40 MG  CPDR (OMEPRAZOLE) 1 qAM as needed dyspepsia SIMVASTATIN 40 MG  TABS (SIMVASTATIN) Take 1 tablet by mouth once a day MELOXICAM 15 MG  TABS (MELOXICAM) Take one tablet daily or as needed FLUOCINONIDE 0.05 %  GEL (FLUOCINONIDE) use as needed * STOOL SOFTENER Take as needed TEMAZEPAM 15 MG  CAPS (TEMAZEPAM) 1 by mouth at bedtime prn CITALOPRAM HYDROBROMIDE 20 MG TABS (CITALOPRAM HYDROBROMIDE) once daily ANUSOL-HC 25 MG  SUPP (HYDROCORTISONE ACETATE) 1 pr two times a day x 6 days AMERGE 1 MG TABS (NARATRIPTAN HCL)  ROBAXIN 500 MG TABS (METHOCARBAMOL) one by mouth three times a day as needed back spasm  Current Allergies (reviewed today): ! 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)       GASTROESOPHAGEAL REFLUX DISEASE (ICD-530.81)       DISC DISEASE, LUMBAR (ICD-722.52)       MIGRAINES, HX OF (ICD-V13.8)       HYPERTENSION (ICD-401.9)       Depression  Past Surgical History:    Reviewed history from 04/05/2007 and no changes required:       APPENDECTOMY, HX OF (ICD-V45.79)       OVARIAN CYSTECTOMY, HX OF (ICD-V45.89)           Family History:    Reviewed history from 05/22/2008 and no changes required:       Family History of Alcoholism/Addiction  Social History:    Reviewed history from 05/22/2008 and no changes required:       Married       Never Smoked       Alcohol use-no       Drug use-no       Regular exercise-no   Risk Factors: Tobacco use:  never Passive smoke exposure:  no Drug use:  no Alcohol use:  no Exercise:  no Seatbelt use:  100 %  Family History Risk Factors:    Family History of MI in females < 37 years old:  no    Family History of MI in males < 75 years old:   no  Colonoscopy History:    Date of Last Colonoscopy:  04/10/2001  Mammogram History:    Date of Last Mammogram:  08/09/2007   Review of Systems       The patient complains of weight gain.  The patient denies anorexia, fever, weight loss, hoarseness, chest pain, syncope, dyspnea on exertion, peripheral edema, prolonged cough, headaches, hemoptysis, abdominal pain, melena, hematochezia, severe indigestion/heartburn, hematuria, abnormal bleeding, and enlarged lymph nodes.     Physical Exam  General:  alert, well-developed, well-nourished, well-hydrated, appropriate dress, and normal appearance.   Neck:     supple, full ROM, no masses, and no thyromegaly.   Lungs:     Normal respiratory effort, chest expands symmetrically. Lungs are clear to auscultation, no crackles or wheezes. Heart:     Normal rate and regular rhythm. S1 and S2 normal without gallop, murmur, click, rub or other extra sounds. Abdomen:     soft, non-tender, normal bowel sounds, no distention, no masses, no guarding, no rigidity, no hepatomegaly, and no splenomegaly.   Pulses:     R and L carotid,radial,femoral,dorsalis pedis and posterior tibial pulses are full and equal bilaterally Extremities:     trace left pedal edema and trace right pedal edema.   Neurologic:     No cranial nerve deficits noted. Station and gait are normal. Plantar reflexes are down-going bilaterally. DTRs are symmetrical throughout. Sensory, motor and coordinative functions appear intact.   Detailed Back/Spine Exam  General:    Well-developed, well-nourished, normal body habitus; no deformities, normal grooming.    Gait:    Normal heel-toe gait pattern bilaterally.    Skin:    Intact, no scars, lesions, rashes, cafe'-au-lait spots, or bruising.    Palpation:    non-tender to palpation over distal leg, medial and lateral ankle, distal achilles tendon, medial and lateral hindfoot, posterior heel, plantar heel, midfoot and forefoot  bilaterally.   Thoracic Exam:  Inspection-deformity:    Normal Palpation-spinal tenderness:  Normal Shoulder Prominence    Location: normal Pelvis Prominence    Location: none Sensory Exam/Pinprick:    Right:       T1:       normal       T2:       normal       T3:       normal       T4:       normal       T5:       normal       T6:       normal       T7:       normal       T8:       normal       T9:       normal       T10:      normal       T11:      normal       T12:      normal    Left:       T1:       normal       T2:       normal       T3:       normal       T4:       normal       T5:       normal       T6:       normal       T7:       normal       T8:       normal       T9:       normal       T10:      normal       T11:      normal  T12:      normal    Impression & Recommendations:  Problem # 1:  DEPRESSION (ICD-311) Assessment: Improved  The following medications were removed from the medication list:    Trazodone Hcl 50 Mg Tabs (Trazodone hcl) .Marland Kitchen... At bedtime  Her updated medication list for this problem includes:    Citalopram Hydrobromide 20 Mg Tabs (Citalopram hydrobromide) ..... Once daily   Problem # 2:  FIBROMYALGIA (ICD-729.1) Assessment: Deteriorated  Her updated medication list for this problem includes:    Meloxicam 15 Mg Tabs (Meloxicam) .Marland Kitchen... Take one tablet daily or as needed    Robaxin 500 Mg Tabs (Methocarbamol) ..... One by mouth three times a day as needed back spasm   Problem # 3:  HYPERTENSION (ICD-401.9) Assessment: Improved  Her updated medication list for this problem includes:    Benicar Hct 40-25 Mg Tabs (Olmesartan medoxomil-hctz) ..... Qd   Problem # 4:  HYPERLIPIDEMIA (ICD-272.4) Assessment: Unchanged  Her updated medication list for this problem includes:    Simvastatin 40 Mg Tabs (Simvastatin) .Marland Kitchen... Take 1 tablet by mouth once a day   Problem # 5:  DISC DISEASE, LUMBAR (ICD-722.52) Assessment:  Deteriorated  Complete Medication List: 1)  Benicar Hct 40-25 Mg Tabs (Olmesartan medoxomil-hctz) .... Qd 2)  Gabapentin 300 Mg Tabs (Gabapentin) .... Take one tablet once daily 3)  Omeprazole 40 Mg Cpdr (Omeprazole) .Marland Kitchen.. 1 qam as needed dyspepsia 4)  Simvastatin 40 Mg Tabs (Simvastatin) .... Take 1 tablet by mouth once a day 5)  Meloxicam 15 Mg Tabs (Meloxicam) .... Take one tablet daily or as needed 6)  Fluocinonide 0.05 % Gel (Fluocinonide) .... Use as needed 7)  Stool Softener  .... Take as needed 8)  Temazepam 15 Mg Caps (Temazepam) .Marland Kitchen.. 1 by mouth at bedtime prn 9)  Citalopram Hydrobromide 20 Mg Tabs (Citalopram hydrobromide) .... Once daily 10)  Anusol-hc 25 Mg Supp (Hydrocortisone acetate) .Marland Kitchen.. 1 pr two times a day x 6 days 11)  Amerge 1 Mg Tabs (Naratriptan hcl) 12)  Robaxin 500 Mg Tabs (Methocarbamol) .... One by mouth three times a day as needed back spasm  Other Orders: Tdap => 22yrs IM (16109) Admin 1st Vaccine (60454) Zoster (Shingles) Vaccine Live [id number]) Admin of Any Addtl Vaccine (91478)  Hypertension Assessment/Plan:      The patient's hypertensive risk group is category B: At least one risk factor (excluding diabetes) with no target organ damage.  Her calculated 10 year risk of coronary heart disease is 5 %.  Today's blood pressure is 132/84.  Her blood pressure goal is < 140/90.  Lipid Assessment/Plan:      Based on NCEP/ATP III, the patient's risk factor category is "0-1 risk factors".  From this information, the patient's calculated lipid goals are as follows: Total cholesterol goal is 200; LDL cholesterol goal is 160; HDL cholesterol goal is 40; Triglyceride goal is 150.        Her BMI is calculated to be 29.18.  The patient has , but she does not meet the criteria for dysmetabolic syndrome.     Patient Instructions: 1)  Please schedule a follow-up appointment in 1 month. 2)  Limit your Sodium (Salt) to less than 4 grams a day (slightly less than 1 teaspoon) to  prevent fluid retention, swelling, or worsening or symptoms. 3)  It is important that you exercise regularly at least 20 minutes 5 times a week. If you develop chest pain, have severe difficulty breathing, or feel very tired , stop  exercising immediately and seek medical attention. 4)  You need to lose weight. Consider a lower calorie diet and regular exercise.  5)  Check your Blood Pressure regularly. If it is above 140/90: you should make an appointment.   Prescriptions: TEMAZEPAM 15 MG  CAPS (TEMAZEPAM) 1 by mouth at bedtime prn  #90 x 1   Entered and Authorized by:   Etta Grandchild MD   Signed by:   Etta Grandchild MD on 06/09/2008   Method used:   Print then Give to Patient   RxID:   [id number] BENICAR HCT 40-25 MG TABS (OLMESARTAN MEDOXOMIL-HCTZ) qd  #90 x 1   Entered and Authorized by:   Etta Grandchild MD   Signed by:   Etta Grandchild MD on 06/09/2008   Method used:   Electronically to        [facility] (mail-order)       [address] [place], [place]  [address]       Ph: [phone]       Fax: [phone]   RxID:   [id number] BENICAR HCT 40-25 MG TABS (OLMESARTAN MEDOXOMIL-HCTZ) qd  #42 x 0   Entered and Authorized by:   Etta Grandchild MD   Signed by:   Etta Grandchild MD on 06/09/2008   Method used:   Electronically to        [facility] (mail[facility]order)       [address] [place], [place]  [address]       Ph: [phone]       Fax: [phone]   RxID:   [id number] ROBAXIN 500 MG TABS (METHOCARBAMOL) one by mouth three times a day as needed back spasm  #35 x 2   Entered and Authorized by:   Etta Grandchild MD   Signed by:   Etta Grandchild MD on 06/09/2008   Method used:   [facility] to        [id number] (retail)       [address]       [place], [place]  [address]       Ph: [phone] or [phone]       Fax: [phone]   RxID:   [id number]  ]  Influenza Immunization History:     Influenza # 1:  Fluvax Non-MCR (04/10/2008)  Tetanus/Td Vaccine    Vaccine Type: Tdap    Site: left deltoid    Mfr: Merck    Dose: 0.5 ml    Route: IM    Given by: Rock Nephew CMA    Exp. Date: 07/19/2009    Lot #: [id number]    VIS given: 05/29/07 version given June 09, 2008.  Zostavax # 1    Vaccine Type: Zostavax    Site: right deltoid    Mfr: Merck    Dose: 0.65    Route: [place]    Given by: Rock Nephew CMA    Exp. Date: 06/14/2010    Lot #: [id number]    VIS given: 04/22/05 given June 09, 2008.

## 2010-08-12 NOTE — Assessment & Plan Note (Signed)
Summary: CONGESTION/NWS   Vital Signs:  Patient profile:   65 year old female Height:      64 inches Weight:      170 pounds BMI:     29.29 O2 Sat:      98 % on Room air Temp:     98.1 degrees F oral Pulse rate:   74 / minute BP sitting:   138 / 80  (left arm) Cuff size:   large  Vitals Entered By: Bill Salinas CMA (July 06, 2009 11:16 AM)  O2 Flow:  Room air CC: pt here with complaint of head congestion with dry cough x 1 week. Pt also complains of frequent nose bleeds/ ab   Primary Care Provider:  Jacques Navy MD  CC:  pt here with complaint of head congestion with dry cough x 1 week. Pt also complains of frequent nose bleeds/ ab.  History of Present Illness: Has been under the weather for 10-14 days: dry cough, sinus congestion, tightness in the abdomen. She had two episodes of epistaxis.  No fever, no chills, no myalgias, N/V, no diarrhea. For cough she has been taking tessalon perles. She took maalox for esophaeal pain and uncontrolled reflux. No exertional chest pain. No SOB. She has had increased eructation.   Current Medications (verified): 1)  Neurontin 300 Mg Caps (Gabapentin) .... Take 1 Tablet By Mouth Two Times A Day 2)  Omeprazole 40 Mg  Cpdr (Omeprazole) .Marland Kitchen.. 1 Qam As Needed Dyspepsia 3)  Crestor 5 Mg Tabs (Rosuvastatin Calcium) .Marland Kitchen.. 1 By Mouth Once Daily 4)  Meloxicam 15 Mg  Tabs (Meloxicam) .... Take One Tablet Daily or As Needed 5)  Fluocinonide 0.05 %  Gel (Fluocinonide) .... Use As Needed 6)  Stool Softener .... Take As Needed 7)  Temazepam 15 Mg  Caps (Temazepam) .Marland Kitchen.. 1 By Mouth At Bedtime Prn 8)  Citalopram Hydrobromide 20 Mg Tabs (Citalopram Hydrobromide) .... Once Daily 9)  Anusol-Hc 25 Mg  Supp (Hydrocortisone Acetate) .Marland Kitchen.. 1 Pr Two Times A Day X 6 Days 10)  Amerge 1 Mg Tabs (Naratriptan Hcl) .Marland Kitchen.. 1 As Needed H/a, May Repeat One More in 2 Hours. Max 2 Tabs in 24 Hours 11)  Robaxin 500 Mg Tabs (Methocarbamol) .... One By Mouth Three Times A Day As  Needed Back Spasm 12)  B-100 Complex  Tabs (Vitamins-Lipotropics) .Marland Kitchen.. 1 Tab Daily 13)  Fa-Vitamin B-6-Vitamin B-12 2.2-25-0.5 Mg Tabs (Folic Acid-Vit B6-Vit B12) .... Take 1 Tablet By Mouth Two Times A Day 14)  Ginkgo Biloba 120 Mg Tabs (Ginkgo Biloba) .... Take 1 Tablet By Mouth Once A Day 15)  Tessalon Perles 100 Mg Caps (Benzonatate) .Marland Kitchen.. 1 Three Times A Day As Needed Cough 16)  Flonase 50 Mcg/act Susp (Fluticasone Propionate) .Marland Kitchen.. 1 Spray Each Nostril .qam 17)  Hydrochlorothiazide 25 Mg Tabs (Hydrochlorothiazide) .... Take 1 Tablet By Mouth Once A Day 18)  Hydrocodone-Acetaminophen 10-325 Mg Tabs (Hydrocodone-Acetaminophen) .Marland Kitchen.. 1 By Mouth Q 6 As Needed  Allergies (verified): 1)  ! 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) GASTROESOPHAGEAL REFLUX DISEASE (ICD-530.81) DISC DISEASE, LUMBAR (ICD-722.52) MIGRAINES, HX OF (ICD-V13.8) HYPERTENSION (ICD-401.9) Depression  Past Surgical History: Last updated: 04/05/2007 APPENDECTOMY, HX OF (ICD-V45.79) OVARIAN CYSTECTOMY, HX OF (ICD-V45.89)    Family History: Last updated: 15-Oct-2008 father- deceased @ 8: CAd/MI-fatal (3rd) mother -deceased @ 64: old age Family History of Alcoholism/Addiction  Neg-colon cancer, DM 2 paternal aunts with breast cancer  Social History: Last updated: 10/09/2008 [employer], [employer] school, AA degree [employer] Married-'64 - 57yrs/divorced; married '98 no children 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  The patient denies fever, weight loss, weight gain, syncope, dyspnea on exertion, peripheral edema, headaches, abdominal pain, hematochezia, muscle weakness, difficulty walking, abnormal bleeding, and angioedema.    Physical Exam  General:  WNWD woman in no distress Head:  no tenderness to percussion over the frontal or maxillary sinus. Normal transillumination. Eyes:  corneas and lenses clear and no injection.   Ears:  EACs and TMs appear normal Nose:  no external deformity and no external erythema. Internal erythema and possible infection both nares.  Mouth:  posterior pharynx clear Neck:  full ROM and no thyromegaly.   Chest Wall:  No deformities, masses, or tenderness noted. Lungs:  Normal respiratory effort, chest expands symmetrically. Lungs are clear to auscultation, no crackles or wheezes. Heart:  normal rate, regular rhythm, and no murmur.   Abdomen:  soft, non-tender, and normal bowel sounds.     Impression & Recommendations:  Problem # 1:  VIRAL URI (ICD-465.9) Patient with viral URI symptoms with no evidence of bacterial infection or need for antibiotics  Plan - supportive care - see pt instructions.            Mupirocin for sore in nares.   Her updated medication list for this problem includes:    Meloxicam 15 Mg Tabs (Meloxicam) .Marland Kitchen... Take one tablet daily or as needed    Tessalon Perles 100 Mg Caps (Benzonatate) .Marland Kitchen... 1 three times a day as needed cough  Complete Medication List: 1)  Neurontin 300 Mg Caps (Gabapentin) .... Take 1 tablet by mouth two times a day 2)  Omeprazole 40 Mg Cpdr (Omeprazole) .Marland Kitchen.. 1 qam as needed dyspepsia 3)  Crestor 5 Mg Tabs (Rosuvastatin calcium) .Marland Kitchen.. 1 by mouth once daily 4)  Meloxicam 15 Mg Tabs (Meloxicam) .... Take one tablet daily or as needed 5)  Fluocinonide 0.05 % Gel (Fluocinonide) .... Use as needed 6)  Stool Softener  .... Take as needed 7)  Temazepam 15 Mg Caps  (Temazepam) .Marland Kitchen.. 1 by mouth at bedtime prn 8)  Citalopram Hydrobromide 20 Mg Tabs (Citalopram hydrobromide) .... Once daily 9)  Anusol-hc 25 Mg Supp (Hydrocortisone acetate) .Marland Kitchen.. 1 pr two times a day x 6 days 10)  Amerge 1 Mg Tabs (Naratriptan hcl) .Marland Kitchen.. 1 as needed h/a, may repeat one more in 2 hours. max 2 tabs in 24 hours 11)  Robaxin 500 Mg Tabs (Methocarbamol) .... One by mouth three times a day as needed back spasm 12)  B-100 Complex Tabs (Vitamins-lipotropics) .Marland Kitchen.. 1 tab daily 13)  Fa-vitamin B-6-vitamin B-12 2.2-25-0.5 Mg Tabs (Folic acid-vit b6-vit b12) .... Take 1 tablet by mouth two times a day 14)  Ginkgo Biloba 120 Mg Tabs (Ginkgo biloba) .... Take 1 tablet by mouth once a day 15)  Tessalon Perles 100 Mg Caps (Benzonatate) .Marland Kitchen.. 1 three times a day as needed cough 16)  Flonase 50 Mcg/act Susp (Fluticasone propionate) .Marland Kitchen.. 1 spray each nostril .qam 17)  Hydrochlorothiazide 25 Mg Tabs (  Hydrochlorothiazide) .... Take 1 tablet by mouth once a day 18)  Hydrocodone-acetaminophen 10-325 Mg Tabs (Hydrocodone-acetaminophen) .Marland Kitchen.. 1 by mouth q 6 as needed 19)  Mupirocin 2 % Oint (Mupirocin) .... Apply two times a day gto nares until soreness resolves  Patient Instructions: 1)  Sinus congestion - noevidence3 of bacterial infection or need for antibiotics. Plan - tessalon perles three times a day, sudafed (generic) 30mg  two times a day or three times a day, loratadine 10mg  once a day - antihistamine to dry the drip, don't blow hard, mupirocin ointment apply to nostril two times a day until not sore, hydrate, vitamin C 1500mg  daily, ecchinacea as tea or juice to help your immune system.  Prescriptions: MUPIROCIN 2 % OINT (MUPIROCIN) apply two times a day gto nares until soreness resolves  #15 x 0   Entered and Authorized by:   Jacques Navy MD   Signed by:   Jacques Navy MD on 07/06/2009   Method used:   [id number]       [address]        [place], [place]  [address]       Ph: [phone] or [phone]       Fax: [phone]   RxID:   [id number]

## 2010-08-12 NOTE — Progress Notes (Signed)
Summary: Cough Med  Phone Note Call from Patient   Summary of Call: Patient is requesting rx for cough syrup. Robitussin DM is not helping.  Initial call taken by: Lamar Sprinkles,  October 13, 2008 9:05 AM  Follow-up for Phone Call        codeine allergy noted. Tessalon perles 100mg  three times a day X 30 refill x 1 Follow-up by: Jacques Navy MD,  October 13, 2008 1:03 PM  Additional Follow-up for Phone Call Additional follow up Details #1::        Pt informed  Additional Follow-up by: Lamar Sprinkles,  October 13, 2008 6:29 PM    New/Updated Medications: TESSALON PERLES 100 MG CAPS (BENZONATATE) 1 three times a day as needed cough   Prescriptions: TESSALON PERLES 100 MG CAPS (BENZONATATE) 1 three times a day as needed cough  #30 x 1   Entered by:   Lamar Sprinkles   Authorized by:   Jacques Navy MD   Signed by:   Lamar Sprinkles on 10/13/2008   Method used:   [facility]        [facility] [id number]       [address]       [place], [place]  [address]       Ph: [phone] or [phone]       Fax: [phone]   RxID:   [id number]

## 2010-08-12 NOTE — Progress Notes (Signed)
Summary: RX  Phone Note Call from Patient   Summary of Call: Pt has apt tomrrow. C/o cough, sore throat & h/a since monday. Pt is taking med for head but is req rx for cough syrup before apt tomorrow.  Initial call taken by: Lamar Sprinkles,  October 08, 2008 8:54 AM  Follow-up for Phone Call        ok for tussionex 1 tsp q 12; she has a codeine allergy listed see if she has tried this in the past. If she has tolerated this then Rx 4 oz.  Follow-up by: Jacques Navy MD,  October 08, 2008 12:59 PM

## 2010-08-12 NOTE — Progress Notes (Signed)
Summary: LAB ORDER AND RX  Phone Note Call from Patient Call back at Home Phone [phone]   Caller: Patient Call For: Jacques Navy MD Summary of Call: Pt called and left message on triage, pt would like Crestor samples and 90 day rx. Initial call taken by: Verdell Face,  April 29, 2009 4:12 PM  Follow-up for Phone Call        OK for sampels if we have them. [facility] She needs lab: lipid 272.4, hepatic 995.20 Follow-up by: Jacques Navy MD,  April 30, 2009 1:13 PM  Additional Follow-up for Phone Call Additional follow up Details #1::        left mess to call office back ..............................Marland KitchenLamar Sprinkles, CMA  May 01, 2009 10:31 AM     Additional Follow-up for Phone Call Additional follow up Details #2::    Pt informed, please add labs to idx, thanks Follow-up by: Lamar Sprinkles, CMA,  May 01, 2009 11:18 AM  Additional Follow-up for Phone Call Additional follow up Details #3:: Details for Additional Follow-up Action Taken: THE LABS ARE IN IDX Additional Follow-up by: Hilarie Fredrickson,  May 01, 2009 1:41 PM  New/Updated Medications: CRESTOR 5 MG TABS (ROSUVASTATIN CALCIUM) 1 by mouth once daily Prescriptions: CRESTOR 5 MG TABS (ROSUVASTATIN CALCIUM) 1 by mouth once daily  #90 x 3   Entered and Authorized by:   Jacques Navy MD   Signed by:   Jacques Navy MD on 04/30/2009   Method used:   [facility]        [facility] [id number]       [address]       [place], [place]  [address]       Ph: [phone] or [phone]       Fax: [phone]   RxID:   [id number]

## 2010-08-12 NOTE — Consult Note (Signed)
Summary: periferal neuropathy feet/[facility]  periferal neuropathy feet/[facility]   Imported By: Lester Ossian 04/25/2008 10:17:49  _____________________________________________________________________  External Attachment:    Type:   Image     Comment:   External Document

## 2010-08-12 NOTE — Progress Notes (Signed)
Summary: AMERGE RX  Phone Note From Pharmacy   [facility] [phone] REF [phone] Summary of Call: Pharm called regarding pt's rx for amerge. There are no directions please advise.  Initial call taken by: Lamar Sprinkles, CMA,  April 30, 2009 9:45 AM  Follow-up for Phone Call        1 for headahce as needed. May repeat at 2 hours. No more than two doses /24hrs, #6, refill prn Follow-up by: Jacques Navy MD,  April 30, 2009 1:18 PM    New/Updated Medications: AMERGE 1 MG TABS (NARATRIPTAN HCL) 1 as needed h/a, may repeat one more in 2 hours. MAX 2 tabs in 24 hours Prescriptions: AMERGE 1 MG TABS (NARATRIPTAN HCL) 1 as needed h/a, may repeat one more in 2 hours. MAX 2 tabs in 24 hours  #18 x 1   Entered by:   Lamar Sprinkles, CMA   Authorized by:   Jacques Navy MD   Signed by:   Lamar Sprinkles, CMA on 04/30/2009   Method used:   Faxed to ...       [facility]       [address] [place], [place]  [address]       Ph: [phone]       Fax: [phone]   RxID:   [id number]

## 2010-08-12 NOTE — Miscellaneous (Signed)
  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 Doppler.... September 10, 2009.... 0-39% RICA, 60-79% LICA Chest pain...Marland KitchenMarland KitchenNuclear stress... 2006... 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 Doppler.... September 10, 2009.... 0-39% RICA, 60-79% LICA Chest pain...Marland KitchenMarland KitchenNuclear stress... 2006... normal

## 2010-08-12 NOTE — Letter (Signed)
   [facility] [address] [place], [place]  [address] Phone: [phone]      May 18, 2009   [name] [address] [place], [place] [address]  RE:  LAB RESULTS  Dear  [name],  The following is an interpretation of your most recent lab tests.  Please take note of any instructions provided or changes to medications that have resulted from your lab work.  LIVER FUNCTION TESTS:  Good - no changes needed  Health professionals look at cholesterol as more involved than just the total cholesterol. We consider the level of LDL (bad) cholesterol, HDL (good), cholesterol, and Triglycerides (Grease) in the blood.  1. Your LDL should be under 100, and the HDL should be over 45, if you have any vascular disease such as heart attack, angina, stroke, TIA (mini stroke), claudication (pain in the legs when you walk due to poor circulation),  Abdominal Aortic Aneurysm (AAA), diabetes or prediabetes.  2. Your LDL should be under 130 if you have any two of the following:     a. Smoke or chew tobacco,     b. High blood pressure (if you are on medication or over 140/90 without medication),     c. Female gender,    d. HDL below 40,    e. A female relative (father, brother, or son), who have had any vascular event          as described in #1. above under the age of 35, or a female relative (mother,       sister, or daughter) who had an event as described above under age 69. (An HDL over 60 will subtract one risk factor from the total, so if you have two items in # 2 above, but an HDL over 60, you then fall into category # 3 below).  3. Your LDL should be under 160 if you have any one of the above.  Triglycerides should be under 200 with the ideal being under 150.  For diabetes or pre-diabetes, the ideal HgbA1C should be under 6.0%.  If you fall into any of the above categories, you should make a follow up appointment to discuss this with your physician.  LIPID PANEL:   Good - no changes needed Triglyceride: 69.0   Cholesterol: 168   LDL: 86   HDL: 67.90   Chol/HDL%:  2    Very good contorl of cholesterol levels. Keep up the good work.    Sincerely Yours,    Jacques Navy MD

## 2010-08-12 NOTE — Letter (Signed)
   [facility] [address] [place], [place]  [address] Phone: [phone]      November 06, 2007   [name] [address] [place], [place] [address]  RE:  LAB RESULTS  Dear  [name],  The following is an interpretation of your most recent lab tests.  Please take note of any instructions provided or changes to medications that have resulted from your lab work.  ELECTROLYTES:  Good - no changes needed  KIDNEY FUNCTION TESTS:  Good - no changes needed    DIABETIC STUDIES:  Excellent - no changes needed Blood Glucose: 97     Labs look great. The basic chemistry panel indicates normal blood sugar and normal renal (kidney) function.   Call or e-mail me if you have questions ([email or web address])    Sincerely Yours,    Catherine Navy MD

## 2010-08-12 NOTE — Progress Notes (Signed)
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 Pt says that she has felt "swimmy headed" and has had a h/a. Pt was apt thursday, does she need apt sooner? Initial call taken by: Lamar Sprinkles,  October 16, 2007 5:12 PM  Follow-up for Phone Call        with this BP can wait until Thursday.  Additional Follow-up for Phone Call Additional follow up Details #1::        Patient informed  Additional Follow-up by: Lamar Sprinkles,  October 16, 2007 6:55 PM

## 2010-08-12 NOTE — Assessment & Plan Note (Signed)
Summary: both feet,ankles swelling/cd   Vital Signs:  Patient profile:   65 year old female Height:      64 inches Weight:      175 pounds BMI:     30.15 O2 Sat:      96 % on Room air Temp:     98.1 degrees F oral Pulse rate:   69 / minute BP sitting:   140 / 82  (left arm) Cuff size:   regular  Vitals Entered By: Bill Salinas CMA (March 17, 2010 9:36 AM)  O2 Flow:  Room air CC: pt c/o feet swelling and ankles x 4 days/ ab   Primary Care Provider:  Jacques Navy MD  CC:  pt c/o feet swelling and ankles x 4 days/ ab.  History of Present Illness: Patient presents with a 4 day h/o lower extremity edema with pain. the swelling does go down over-night. Chart reviewed - she has had normal BUN/Cr over the past 3 years. she has not had any symptoms to suggest acute renal failure. she has no h/o CHF or cardiomyopathy. She does take gabapentin but has been on this for a long time without any evidence of peripheral edema.   Current Medications (verified): 1)  Neurontin 300 Mg Caps (Gabapentin) .... Take 1 Tablet By Mouth Two Times A Day 2)  Omeprazole 40 Mg  Cpdr (Omeprazole) .... Every Other Am As Needed Dyspepsia 3)  Meloxicam 15 Mg  Tabs (Meloxicam) .... Take One Tablet Daily or As Needed 4)  Fluocinonide 0.05 %  Gel (Fluocinonide) .... Use As Needed 5)  Stool Softener .... Take As Needed 6)  Temazepam 15 Mg  Caps (Temazepam) .Marland Kitchen.. 1 By Mouth At Bedtime Prn 7)  Citalopram Hydrobromide 20 Mg Tabs (Citalopram Hydrobromide) .... Once Daily 8)  Anusol-Hc 25 Mg  Supp (Hydrocortisone Acetate) .Marland Kitchen.. 1 Pr Two Times A Day X 6 Days 9)  Amerge 1 Mg Tabs (Naratriptan Hcl) .Marland Kitchen.. 1 As Needed H/a, May Repeat One More in 2 Hours. Max 2 Tabs in 24 Hours 10)  Robaxin 500 Mg Tabs (Methocarbamol) .... One By Mouth Three Times A Day As Needed Back Spasm 11)  Fa-Vitamin B-6-Vitamin B-12 2.2-25-0.5 Mg Tabs (Folic Acid-Vit B6-Vit B12) .... Take 1 Tablet By Mouth Once Daily 12)  Tessalon Perles 100 Mg Caps  (Benzonatate) .Marland Kitchen.. 1 Three Times A Day As Needed Cough Prn 13)  Flonase 50 Mcg/act Susp (Fluticasone Propionate) .Marland Kitchen.. 1 Spray Each Nostril  Every Morning As Needed 14)  Hydrochlorothiazide 25 Mg Tabs (Hydrochlorothiazide) .... Take 1 Tablet By Mouth Once A Day 15)  Hydrocodone-Acetaminophen 10-325 Mg Tabs (Hydrocodone-Acetaminophen) .Marland Kitchen.. 1 By Mouth Q 6 As Needed 16)  Aspirin 81 Mg Tbec (Aspirin) .... Take One Tablet By Mouth Daily 17)  Crestor 5 Mg Tabs (Rosuvastatin Calcium) .... Every Other Day  Allergies (verified): 1)  ! 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 Doppler.... September 10, 2009.... 0-39% RICA, 60-79% LICA Chest pain...Marland KitchenMarland KitchenNuclear stress... 2006... normal Statin therapy.... 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  The patient denies anorexia, fever, weight loss, weight gain, decreased hearing, hoarseness, chest pain, syncope, dyspnea on exertion, prolonged cough, abdominal pain, incontinence, suspicious skin  lesions, depression, abnormal bleeding, and angioedema.    Physical Exam  General:  Overweight white female in no acute distress Head:  normocephalic and atraumatic.   Eyes:  pupils equal and pupils round.   Neck:  supple, no thyromegaly, and no carotid bruits.   Lungs:  normal respiratory effort and normal breath sounds.   Heart:  normal rate, regular rhythm, no murmur, and no JVD.   Msk:  normal ROM, no joint swelling, and no joint warmth.   Pulses:  2+ radial and DP pulses Extremities:  trace pedal edema bilaterally Neurologic:  alert & oriented X3, cranial nerves II-XII intact, and gait normal.   Skin:  turgor normal, color normal, and no suspicious lesions.   Psych:  Oriented X3, memory intact for recent and remote, normally interactive, and not anxious appearing.     Impression & Recommendations:  Problem # 1:  INSOMNIA, CHRONIC (ICD-307.42) still a problem.  Plan - do not lay in bed awake           go to bed when tired/sleepy  Problem # 2:  PEDAL EDEMA (ICD-782.3)  Most likely venous insufficiency. No cardiac symptoms.  Plan - repeat Bmet           otc support hosiery  Her updated medication list for this problem includes:    Hydrochlorothiazide 25 Mg Tabs (Hydrochlorothiazide) .Marland Kitchen... Take 1 tablet by mouth once a day  Orders: TLB-BMP (Basic Metabolic Panel-BMET) (80048-METABOL)  Complete Medication List: 1)  Neurontin 300 Mg Caps (Gabapentin) .... Take 1 tablet by mouth two times a day 2)  Omeprazole 40 Mg Cpdr (Omeprazole) .... Every other am as needed dyspepsia 3)  Meloxicam 15 Mg Tabs (Meloxicam) .... Take one tablet daily or as needed 4)  Fluocinonide 0.05 % Gel (Fluocinonide) .... Use as needed 5)  Stool Softener  .... Take as needed 6)  Temazepam 15 Mg Caps (Temazepam) .Marland Kitchen.. 1 by mouth at bedtime prn 7)  Citalopram Hydrobromide 20 Mg Tabs (Citalopram hydrobromide) .... Once daily 8)  Anusol-hc 25 Mg Supp (Hydrocortisone acetate) .Marland Kitchen.. 1 pr two times a day x 6  days 9)  Amerge 1 Mg Tabs (Naratriptan hcl) .Marland Kitchen.. 1 as needed h/a, may repeat one more in 2 hours. max 2 tabs in 24 hours 10)  Robaxin 500 Mg Tabs (Methocarbamol) .... One by mouth three times a day as needed back spasm 11)  Fa-vitamin B-6-vitamin B-12 2.2-25-0.5 Mg Tabs (Folic acid-vit b6-vit b12) .... Take 1 tablet by mouth once daily 12)  Tessalon Perles 100 Mg Caps (Benzonatate) .Marland Kitchen.. 1 three times a day as needed cough prn 13)  Flonase 50 Mcg/act Susp (Fluticasone propionate) .Marland Kitchen.. 1 spray each nostril  every morning as needed 14)  Hydrochlorothiazide 25 Mg Tabs (Hydrochlorothiazide) .... Take 1 tablet by mouth once a day 15)  Hydrocodone-acetaminophen 10-325 Mg Tabs (Hydrocodone-acetaminophen) .Marland Kitchen.. 1 by mouth q 6 as needed 16)  Aspirin 81 Mg Tbec (Aspirin) .... Take one tablet by mouth daily 17)  Crestor 5 Mg Tabs (Rosuvastatin  calcium) .... Every other day  Patient Instructions: 1)  swellling of feet - most likely a venous circulation issue. No evidence of heart or kidney disease; no evidence of any vascular disease. Plan - lab today to confirm normal kidney function; knee high over the counter support hose. 2)  Sleep- do not lay in bed awake. Go to bed when tired/sleepy

## 2010-08-12 NOTE — Procedures (Signed)
Summary: Colonoscopy Report/[facility]  Colonoscopy Report/[facility]   Imported By: Esmeralda Links D'jimraou 10/09/2007 14:15:01  _____________________________________________________________________  External Attachment:    Type:   Image     Comment:   External Document

## 2010-08-12 NOTE — Assessment & Plan Note (Signed)
Summary: PAIN IN BREAST/ HAS SPOKEN WITH SARAH/ NWS   Vital Signs:  Patient profile:   65 year old female Height:      64 inches Weight:      176 pounds BMI:     30.32 O2 Sat:      97 % on Room air Temp:     97.7 degrees F oral Pulse rate:   67 / minute BP sitting:   174 / 82  (left arm) Cuff size:   regular  Vitals Entered By: Bill Salinas CMA (April 28, 2010 11:37 AM)  O2 Flow:  Room air CC: pt c/o left breast pain x about 2 weeks/ ab   Primary Care Provider:  Jacques Navy MD  CC:  pt c/o left breast pain x about 2 weeks/ ab.  History of Present Illness: Patient presents for evaluation of a painful left breast. She reports more than 1 week h/o burning tingling type discomfort at the lower half of the left breast. She has not noted any rash or felt any lumps. She has had a recent normal mammogram. There has been no nipple discharge, no fevers, no trauma or injury of any kind, no muscle strain.   Current Medications (verified): 1)  Neurontin 300 Mg Caps (Gabapentin) .... Take 1 Tablet By Mouth Two Times A Day 2)  Omeprazole 40 Mg  Cpdr (Omeprazole) .... Every Other Am As Needed Dyspepsia 3)  Meloxicam 15 Mg  Tabs (Meloxicam) .... Take One Tablet Daily or As Needed 4)  Fluocinonide 0.05 %  Gel (Fluocinonide) .... Use As Needed 5)  Stool Softener .... Take As Needed 6)  Temazepam 15 Mg  Caps (Temazepam) .Marland Kitchen.. 1 By Mouth At Bedtime Prn 7)  Citalopram Hydrobromide 20 Mg Tabs (Citalopram Hydrobromide) .... Once Daily 8)  Anusol-Hc 25 Mg  Supp (Hydrocortisone Acetate) .Marland Kitchen.. 1 Pr Two Times A Day X 6 Days 9)  Amerge 1 Mg Tabs (Naratriptan Hcl) .Marland Kitchen.. 1 As Needed H/a, May Repeat One More in 2 Hours. Max 2 Tabs in 24 Hours 10)  Robaxin 500 Mg Tabs (Methocarbamol) .... One By Mouth Three Times A Day As Needed Back Spasm 11)  Fa-Vitamin B-6-Vitamin B-12 2.2-25-0.5 Mg Tabs (Folic Acid-Vit B6-Vit B12) .... Take 1 Tablet By Mouth Once Daily 12)  Tessalon Perles 100 Mg Caps (Benzonatate)  .Marland Kitchen.. 1 Three Times A Day As Needed Cough Prn 13)  Flonase 50 Mcg/act Susp (Fluticasone Propionate) .Marland Kitchen.. 1 Spray Each Nostril  Every Morning As Needed 14)  Hydrochlorothiazide 25 Mg Tabs (Hydrochlorothiazide) .... Take 1 Tablet By Mouth Once A Day 15)  Hydrocodone-Acetaminophen 10-325 Mg Tabs (Hydrocodone-Acetaminophen) .Marland Kitchen.. 1 By Mouth Q 6 As Needed 16)  Aspirin 81 Mg Tbec (Aspirin) .... Take One Tablet By Mouth Daily 17)  Crestor 5 Mg Tabs (Rosuvastatin Calcium) .... Every Other Day  Allergies (verified): 1)  ! 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 Doppler.... September 10, 2009.... 0-39% RICA, 60-79% LICA Chest pain...Marland KitchenMarland KitchenNuclear stress... 2006... normal Statin therapy.... muscle aches from simvastatin and 5 mg Crestor-qod   Past Surgical History: Last updated: 04/05/2007 APPENDECTOMY, HX OF (ICD-V45.79) OVARIAN CYSTECTOMY, HX OF (ICD-V45.89)   PSH reviewed for relevance, FH reviewed for relevance  Review of Systems  The patient denies anorexia, fever, weight loss, chest pain, dyspnea  on exertion, abdominal pain, muscle weakness, abnormal bleeding, and enlarged lymph nodes.    Physical Exam  General:  Well-developed,well-nourished,in no acute distress; alert,appropriate and cooperative throughout examination Breasts:  Left breast: skin appears normal with no rash or lesion, nipple without discharge. Minor fibrocystic changes and increased tenderness to palpation. No fixed mass or lesions. Lungs:  normal respiratory effort.   Heart:  normal rate and regular rhythm.   Skin:  turgor normal, color normal, and no rashes.   Axillary  Nodes:  no L axillary adenopathy.     Impression & Recommendations:  Problem # 1:  BREAST PAIN, LEFT (ICD-611.71) normal exam.  Plan - patient reassured that there is no sign of malignancy or infection           she will continue her present pain medications.           May try low grade heat           for persistent symptoms may need imaging or gyn consultation.  Complete Medication List: 1)  Neurontin 300 Mg Caps (Gabapentin) .... Take 1 tablet by mouth two times a day 2)  Omeprazole 40 Mg Cpdr (Omeprazole) .... Every other am as needed dyspepsia 3)  Meloxicam 15 Mg Tabs (Meloxicam) .... Take one tablet daily or as needed 4)  Fluocinonide 0.05 % Gel (Fluocinonide) .... Use as needed 5)  Stool Softener  .... Take as needed 6)  Temazepam 15 Mg Caps (Temazepam) .Marland Kitchen.. 1 by mouth at bedtime prn 7)  Citalopram Hydrobromide 20 Mg Tabs (Citalopram hydrobromide) .... Once daily 8)  Anusol-hc 25 Mg Supp (Hydrocortisone acetate) .Marland Kitchen.. 1 pr two times a day x 6 days 9)  Amerge 1 Mg Tabs (Naratriptan hcl) .Marland Kitchen.. 1 as needed h/a, may repeat one more in 2 hours. max 2 tabs in 24 hours 10)  Robaxin 500 Mg Tabs (Methocarbamol) .... One by mouth three times a day as needed back spasm 11)  Fa-vitamin B-6-vitamin B-12 2.2-25-0.5 Mg Tabs (Folic acid-vit b6-vit b12) .... Take 1 tablet by mouth once daily 12)  Tessalon Perles 100 Mg Caps (Benzonatate) .Marland Kitchen.. 1 three times a day as needed cough prn 13)  Flonase 50 Mcg/act Susp (Fluticasone propionate) .Marland Kitchen.. 1 spray each nostril  every morning as needed 14)  Hydrochlorothiazide 25 Mg Tabs (Hydrochlorothiazide) .... Take 1 tablet by mouth once a day 15)  Hydrocodone-acetaminophen 10-325 Mg Tabs (Hydrocodone-acetaminophen) .Marland Kitchen.. 1 by mouth q 6 as needed 16)  Aspirin 81 Mg Tbec (Aspirin) .... Take one tablet by mouth daily 17)  Crestor 5 Mg Tabs (Rosuvastatin calcium) .... Every other day   Orders Added: 1)  Est. Patient Level III [[address]]

## 2010-08-12 NOTE — Medication Information (Signed)
Summary: Omeprazole/[facility]  Omeprazole/[facility]   Imported By: Lanelle Bal 10/24/2008 14:21:03  _____________________________________________________________________  External Attachment:    Type:   Image     Comment:   External Document

## 2010-08-12 NOTE — Letter (Signed)
Summary: Out of Work  [facility]  [address] [place], [place] [address]   Phone: [phone]  Fax: [phone]    October 15, 2008   Employee:  [name]    To Whom It May Concern:   For Medical reasons, please excuse the above named employee from work for the following dates:  Start: 10/15/08    End: 10/15/08    If you need additional information, please feel free to contact our office.         Sincerely,    Orlan Leavens, RMA/ Dr. Otho Najjar, MD

## 2010-08-12 NOTE — Progress Notes (Signed)
Summary: REFILL  Phone Note Call from Patient Call back at Home Phone [phone] Call back at [phone]   Caller: Patient Reason for Call: Talk to Doctor Summary of Call: REQUESTING RX FOR GABAPENTIN 90 DAY SUPPLY, ALSO NEED REFILL ON MED AMERGE Initial call taken by: Orlan Leavens,  June 18, 2007 2:50 PM  Follow-up for Phone Call        rxs done Follow-up by: Jacques Navy MD,  June 19, 2007 9:13 AM  Additional Follow-up for Phone Call Additional follow up Details #1::        Pt informed  Additional Follow-up by: Lamar Sprinkles,  June 19, 2007 1:40 PM      Prescriptions: AMERGE 2.5 MG  TABS (NARATRIPTAN HCL) Take as needed  #18 x 3   Entered and Authorized by:   Jacques Navy MD   Signed by:   Jacques Navy MD on 06/19/2007   Method used:   Print then Give to Patient   RxID:   [id number] GABAPENTIN 300 MG  TABS (GABAPENTIN) Take one tablet once daily  #90 x 3   Entered and Authorized by:   Jacques Navy MD   Signed by:   Jacques Navy MD on 06/19/2007   Method used:   Print then Give to Patient   RxID:   [id number]

## 2010-08-12 NOTE — Progress Notes (Signed)
Summary: CHRONIC [facility]   Phone Note Call from Patient Call back at [phone]   Caller: Patient Call For: Dr Jarold Motto Reason for Call: Talk to Nurse Summary of Call: CHRONIC DIARREAH. DESPERATELY NEEDS TO BE SEEN-DID NOT WANT NEXT AVAILABLE 05-14-08 Initial call taken by: Leanor Kail [facility],  April 17, 2008 9:44 AM  Follow-up for Phone Call        Left message on patients machine to call back. Harlow Mares CMA  April 17, 2008 11:24 AM Spoke to pt and asked how we can help her with her diarrhea problem.  She thanked me for calling and  said it was her sons 73th birthday, could she call [place] back?  I told her we would be glad to help her if she needs [place] .  Lowry Ram CMA

## 2010-08-12 NOTE — Letter (Signed)
   [facility] [address] [place], [place]  [address] Phone: [phone]      March 27, 2008   BRI WAKEMAN [address] [place], [place] [address]  RE:  LAB RESULTS  Dear  Ms. Cotta,  The following is an interpretation of your most recent lab tests.  Please take note of any instructions provided or changes to medications that have resulted from your lab work.  ELECTROLYTES:  Good - no changes needed  KIDNEY FUNCTION TESTS:  Good - no changes needed  LIVER FUNCTION TESTS:  Good - no changes needed  LIPID PANEL:  Good - no changes needed Triglyceride: 40   Cholesterol: 153   LDL: 57   HDL: 87.7   Chol/HDL%:  1.7 CALC     Great control of lipids.  Call or e-mail me if you have questions ([email or web address]).   Sincerely Yours,    Jacques Navy MD

## 2010-08-12 NOTE — Letter (Signed)
Summary: [facility] Consult Scheduled Letter  [facility]  [address] [place], [place] [address]   Phone: [phone]  Fax: [phone]      10/22/2007 MRN: [id number]  [name] [address] [place], [place]  [address]    Dear Ms. Baroni,      We have scheduled an appointment for you.  At the recommendation of Dr. Debby Bud, we have scheduled you a consult with Dr. Jearld Fenton on Nov 30, 2007 at 3:10pm.  Their phone number is [phone].  If this appointment day and time is not convenient for you, please feel free to call the office of the doctor you are being referred to at the number listed above and reschedule the appointment.  Dr. Karie Schwalbe [facility] [address] [place], [place] [address]    Thank you,  Patient Care Coordinator [facility]

## 2010-08-12 NOTE — Assessment & Plan Note (Signed)
Summary: BP IS HIGH/ ALSO LEAVING MSG WITH TRIAGE/NWS   Vital Signs:  Patient Profile:   65 Years Old Female Height:     64 inches Weight:      156 pounds Temp:     98.1 degrees F oral Pulse rate:   68 / minute BP sitting:   141 / 78  (left arm) Cuff size:   regular  Vitals Entered By: M.Wilkie/SMA                 PCP:  Jacques Navy MD  Chief Complaint:  Hot flashes/headaches/dizzy/thinks BP has been up.  History of Present Illness:  BP has been running in the 150's/90s. Keeps a dull headache. Also having a hot feeling. No specific symptoms otherwise.    Current Allergies: ! 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)       GASTROESOPHAGEAL REFLUX DISEASE (ICD-530.81)       DISC DISEASE, LUMBAR (ICD-722.52)       MIGRAINES, HX OF (ICD-V13.8)       HYPERTENSION (ICD-401.9)                 Past Surgical History:    Reviewed history from 04/05/2007 and no changes required:       APPENDECTOMY, HX OF (ICD-V45.79)       OVARIAN CYSTECTOMY, HX OF (ICD-V45.89)              Physical Exam  General:     Well-developed,well-nourished,in no acute distress; alert,appropriate and cooperative throughout examination Head:     Normocephalic and atraumatic without obvious abnormalities. No apparent alopecia or balding. Eyes:     vision grossly intact, pupils equal, pupils round, pupils reactive to light, corneas and lenses clear, no injection, no optic disk abnormalities, and no retinal abnormalitiies.   Neck:     No deformities, masses, or tenderness noted. Lungs:     normal respiratory effort, no intercostal retractions, no accessory muscle use, normal breath sounds, and no wheezes.   Heart:     normal rate,  regular rhythm, no murmur, and no gallop.   Abdomen:     soft, non-tender, normal bowel sounds, no distention, no masses, no rigidity, and no hepatomegaly.      Impression & Recommendations:  Problem # 1:  TINNITUS (ICD-388.30) Continue to have tinnitis  Plan: ENT referral  Orders: ENT Referral (ENT)   Problem # 2:  HYPERTENSION (ICD-401.9) BP has remained elevated.  Plan: will change to lisinopril/hct 10/12.5  Her updated medication list for this problem includes:    Hydrochlorothiazide 25 Mg Tabs (Hydrochlorothiazide) .Marland Kitchen... Take one tablet once daily    Lisinopril-hydrochlorothiazide 10-12.5 Mg Tabs (Lisinopril-hydrochlorothiazide) .Marland Kitchen... 1 by mouth once daily   Complete Medication List: 1)  Hydrochlorothiazide 25 Mg Tabs (Hydrochlorothiazide) .... Take one tablet once daily 2)  Gabapentin 300 Mg Tabs (Gabapentin) .... Take one tablet once daily 3)  Omeprazole 40 Mg Cpdr (Omeprazole) .Marland Kitchen.. 1 qam as needed dyspepsia 4)  Simvastatin 40 Mg Tabs (Simvastatin) .... Take one tablet once daily 5)  Meloxicam 15 Mg Tabs (Meloxicam) .... Take one tablet daily or as needed 6)  Sumatriptan Succinate 100 Mg  Tabs (Sumatriptan succinate) .Marland Kitchen.. 1 as needed h/a 7)  Fluocinonide 0.05 % Gel (Fluocinonide) .... Use as needed 8)  Stool Softener  .... Take as needed 9)  Temazepam 15 Mg Caps (Temazepam) .Marland Kitchen.. 1 by mouth at bedtime prn 10)  Citalopram Hydrobromide 10 Mg Tabs (Citalopram hydrobromide) .... Take 1 tablet by mouth once a day 11)  Anusol-hc 25 Mg Supp (Hydrocortisone acetate) .Marland Kitchen.. 1 pr two times a day x 6 days 12)  Lisinopril-hydrochlorothiazide 10-12.5 Mg Tabs (Lisinopril-hydrochlorothiazide) .Marland Kitchen.. 1 by mouth once daily   Patient Instructions: 1)  return in 3 weeks for nurse visit to monitor BP. come in 2 days early for lab work. We'll go over the lab when you come for the nurse visit.    Prescriptions: LISINOPRIL-HYDROCHLOROTHIAZIDE 10-12.5 MG  TABS  (LISINOPRIL-HYDROCHLOROTHIAZIDE) 1 by mouth once daily  #30 x 0   Entered and Authorized by:   Jacques Navy MD   Signed by:   Jacques Navy MD on 10/18/2007   Method used:   [facility]ron[facility] to ...       [id number]       [address]       [place], [place]  [address]       Ph: [phone] or [phone]       Fax: [phone]   RxID:   [id number]  ]

## 2010-08-12 NOTE — Medication Information (Signed)
Summary: Rx Omeprazole/[facility]  Rx Omeprazole/[facility]   Imported By: Sherian Rein 10/23/2008 14:41:25  _____________________________________________________________________  External Attachment:    Type:   Image     Comment:   External Document

## 2010-08-12 NOTE — Progress Notes (Signed)
Summary: MAIL ORDER RF  Phone Note Refill Request   Refills Requested: Medication #1:  SIMVASTATIN 40 MG  TABS Take one tablet once daily   Notes: FOR MAIL ORDER Initial call taken by: Lamar Sprinkles,  March 13, 2008 9:07 AM  Follow-up for Phone Call        OK  simvastatin #90 1 by mouth qPM refill x 3. Due for lipid profile 272.4, Hepatic 995.20 Follow-up by: Jacques Navy MD,  March 13, 2008 10:35 AM  Additional Follow-up for Phone Call Additional follow up Details #1::        Rx faxed to [facility] per Dr Debby Bud okay, patient instructed to return for labs. Patient verbalized understanding and agrees Additional Follow-up by: Glendell Docker CMA,  March 14, 2008 8:54 AM    New/Updated Medications: SIMVASTATIN 40 MG  TABS (SIMVASTATIN) Take 1 tablet by mouth once a day   Prescriptions: SIMVASTATIN 40 MG  TABS (SIMVASTATIN) Take 1 tablet by mouth once a day  #90 x 3   Entered by:   Glendell Docker CMA   Authorized by:   Jacques Navy MD   Signed by:   Glendell Docker CMA on 03/14/2008   Method used:   Faxed to ...       [facility]       [address]       [place], [place]  [address]       Ph: [phone]       Fax: [phone]   RxID:   [id number]

## 2010-08-12 NOTE — Consult Note (Signed)
Summary: [facility]  [facility]   Imported By: Lester [place] 10/30/2008 10:48:29  _____________________________________________________________________  External Attachment:    Type:   Image     Comment:   External Document

## 2010-08-12 NOTE — Medication Information (Signed)
Summary: [facility]  [facility]   Imported By: Esmeralda Links D'jimraou 04/18/2008 11:45:26  _____________________________________________________________________  External Attachment:    Type:   Image     Comment:   External Document

## 2010-08-12 NOTE — Letter (Signed)
   [facility] [address] [place], [place]  [address] Phone: [phone]      September 13, 2007   [name] [address] [place], [place] [address]  RE:  LAB RESULTS  Dear  [name],  The following is an interpretation of your most recent lab tests.  Please take note of any instructions provided or changes to medications that have resulted from your lab work.     Carotid doppler from Feb 24th showed stable disease with a 60-79% blockage left internal carotid artery; 40-59% right internal carotid artery. Follow up study in 6 months is recommended.   Call or e-mail me if you have questions ([email or web address])    Sincerely Yours,    Jacques Navy MD

## 2010-08-12 NOTE — Progress Notes (Signed)
Summary: Breast pain   Phone Note Call from Patient Call back at Home Phone [phone] Call back at Work Phone [phone]   Summary of Call: Pt c/o stinging senstation in her left breast. She just had a mamogram and wants to know if this is something she needs to be evaluated for?  Initial call taken by: Lamar Sprinkles, CMA,  April 19, 2010 9:55 AM  Follow-up for Phone Call        for persistent pain or any palpable lump she will need ov for repeat breast exam. Follow-up by: Jacques Navy MD,  April 19, 2010 12:25 PM  Additional Follow-up for Phone Call Additional follow up Details #1::        left mess to call office back on both #'s. ..................Marland KitchenLamar Sprinkles, CMA  April 19, 2010 5:23 PM   Pt informed, she says symptoms have gotten better. She will come in for eval if symptoms do not completely resolve or become worse Additional Follow-up by: Lamar Sprinkles, CMA,  April 21, 2010 2:26 PM

## 2010-08-12 NOTE — Miscellaneous (Signed)
  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 Doppler.... September 10, 2009.... 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 Doppler.... September 10, 2009.... 0-39% RICA, 60-79% LICA

## 2010-08-12 NOTE — Progress Notes (Signed)
Summary[facility]  Phone Note From Pharmacy   Caller: [facility] Summary of Call: Pharm has questions:  1.Temazepam: Would doctor consider lower dose or decreased qty b/c pt uses on as needed basis? 2. Dx for temazepam and meloxicam 3. Length of treatment for temazepam? reason is b/c of cardiovascular risk 4. Ok to fill both?   [facility] [id number]   Initial call taken by: Lamar Sprinkles, CMA,  April 16, 2009 11:15 AM  Follow-up for Phone Call        1. Temazepam for insomnia 15 mg is a middle dose and totally appropriate for this patient. She takes this as needed and the quantity is generally 90 through Rx mg'd companies with refills as needed 2. Meloxicam is part of a complex regimen for the management of fibromyalgia and DJD. If you provide a signed release we can provide you her detailed medical history to assist you in the managment of this patient, in addition to my own efforts.  3. Indefinite use of sleep aide with a small, acceptable risk of any cardiovascular effect. I welcome any detailed studies or information you wish to provide. 4. OK to refill prescriptions as needed.  Follow-up by: Jacques Navy MD,  April 16, 2009 4:18 PM  Additional Follow-up for Phone Call Additional follow up Details #1::        Left detailed vm with above info Additional Follow-up by: Lamar Sprinkles, CMA,  April 17, 2009 8:18 AM

## 2010-08-12 NOTE — Assessment & Plan Note (Signed)
Summary: CPX/$50/PN   Vital Signs:  Patient profile:   65 year old female Height:      64 inches Weight:      170 pounds BMI:     29.29 O2 Sat:      95 % Temp:     99.0 degrees F oral Pulse rate:   83 / minute BP sitting:   124 / 82  (left arm) Cuff size:   large  Vitals Entered By: Zackery Barefoot CMA (October 09, 2008 1:34 PM)  Primary Care Provider:  Jacques Navy MD   History of Present Illness: Patient presents for a well woman exam. She has had an URI for 4-5 days with sinus pressure, cough that is non-productive, no fever, no shortness of breath. She has felt bad.  She continues to have severe pain in her feet due to peripheral neuropathy. She is taking folic acid, B12 and B50 complex. This has helped! She has seen Dr. Lestine Box and he has no surgery to offer. She does wear easy spirit shoes which help. She reports that gabapentin makes her tired.     Preventive Screening-Counseling & Management     Alcohol drinks/day: 0     Smoking Status: quit     Year Quit: 1998     Caffeine use/day: 3-5 cups     Diet Comments: heart healthy     Diet Counseling: to improve diet; diet is suboptimal  Current Medications (verified): 1)  Benicar Hct 40-25 Mg Tabs (Olmesartan Medoxomil-Hctz) .... Qd 2)  Gabapentin 300 Mg  Tabs (Gabapentin) .... Take One Tablet Once Daily 3)  Omeprazole 40 Mg  Cpdr (Omeprazole) .Marland Kitchen.. 1 Qam As Needed Dyspepsia 4)  Simvastatin 40 Mg  Tabs (Simvastatin) .... Take 1 Tablet By Mouth Once A Day 5)  Meloxicam 15 Mg  Tabs (Meloxicam) .... Take One Tablet Daily or As Needed 6)  Fluocinonide 0.05 %  Gel (Fluocinonide) .... Use As Needed 7)  Stool Softener .... Take As Needed 8)  Temazepam 15 Mg  Caps (Temazepam) .Marland Kitchen.. 1 By Mouth At Bedtime Prn 9)  Citalopram Hydrobromide 20 Mg Tabs (Citalopram Hydrobromide) .... Once Daily 10)  Anusol-Hc 25 Mg  Supp (Hydrocortisone Acetate) .Marland Kitchen.. 1 Pr Two Times A Day X 6 Days 11)  Amerge 1 Mg Tabs (Naratriptan Hcl) 12)  Robaxin  500 Mg Tabs (Methocarbamol) .... One By Mouth Three Times A Day As Needed Back Spasm 13)  B Complex 50  Tabs (B Complex Vitamins) .... Take 1 Tablet By Mouth Once A Day 14)  Fa-Vitamin B-6-Vitamin B-12 2.2-25-0.5 Mg Tabs (Folic Acid-Vit B6-Vit B12) .... Take 1 Tablet By Mouth Two Times A Day 15)  Ginkgo Biloba 120 Mg Tabs (Ginkgo Biloba) .... Take 1 Tablet By Mouth Once A Day  Allergies: 1)  ! 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)    GASTROESOPHAGEAL REFLUX DISEASE (ICD-530.81)    DISC DISEASE, LUMBAR (ICD-722.52)    MIGRAINES, HX OF (ICD-V13.8)    HYPERTENSION (ICD-401.9)    Depression     (05/22/2008)  Past Surgical History:    APPENDECTOMY, HX OF (ICD-V45.79)    OVARIAN CYSTECTOMY, HX OF (ICD-V45.89)      (04/05/2007)  Family History:    father- deceased @ 67: CAd/MI-fatal (3rd)    mother -deceased @  9: old age    Family History of Alcoholism/Addiction    Neg-colon cancer, DM    2 paternal aunts with breast cancer  Social History:    HS[employer], [employer] school, AA degree [employer]    Married-'64 - 8yrs/divorced; married '98    no children    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  The patient denies anorexia, fever, weight loss, weight gain, chest pain, syncope, headaches, hemoptysis, abdominal pain, severe indigestion/heartburn, incontinence, muscle weakness, depression, and enlarged lymph nodes.         tinnitus left ear with some hearing loss; stress incontinence - not doing Kegel  Physical Exam  General:  heavy set white female NAD Head:  normocephalic and atraumatic.  No sinus tenderness to percussion Eyes:   vision grossly intact, pupils equal, pupils round, corneas and lenses clear, and no injection.   Ears:  R ear normal, L ear normal, and no external deformities.   Nose:  no external deformity and no external erythema.   Mouth:  Oral mucosa and oropharynx without lesions or exudates.  Teeth in good repair. Neck:  supple, full ROM, no thyromegaly, and no carotid bruits.   Chest Wall:  no deformities and no tenderness.   Breasts:  No mass, nodules, thickening, tenderness, bulging, retraction, inflamation, nipple discharge or skin changes noted.   Lungs:  Normal respiratory effort, chest expands symmetrically. Lungs are clear to auscultation, no crackles or wheezes. Heart:  Normal rate and regular rhythm. S1 and S2 normal without gallop, murmur, click, rub or other extra sounds. Abdomen:  soft, non-tender, normal bowel sounds, no distention, and no hepatomegaly.   Genitalia:  Pelvic Exam:        External: normal female genitalia without lesions or masses        Vagina: normal without lesions or masses        Cervix: normal without lesions or masses        Uterus: normal by palpation        Pap smear: performed Msk:  normal ROM, no joint tenderness, no joint swelling, no joint warmth, and no joint instability.   Pulses:  R radial normal and R femoral normal.   Extremities:  No clubbing, cyanosis, edema, or deformity noted with normal full range of motion of all joints.   Neurologic:  No cranial nerve deficits noted. Station and gait are normal. Plantar reflexes are down-going bilaterally. DTRs are symmetrical throughout. Sensory, motor and coordinative functions appear intact. Skin:  turgor normal, color normal, no rashes, no suspicious lesions, no ulcerations, and no edema.   Cervical Nodes:  no anterior cervical adenopathy and no posterior cervical adenopathy.   Axillary Nodes:  no R axillary adenopathy and no L axillary adenopathy.   Psych:  Oriented X3, memory intact for recent and remote,  normally interactive, and good eye contact.     Impression & Recommendations:  Problem # 1:  DEPRESSION (ICD-311)  appears stable on present medications: tolerating well with no adverse effects.  Her updated medication list for this problem includes:    Citalopram Hydrobromide 20 Mg Tabs (Citalopram hydrobromide) ..... Once daily  Problem # 2:  PLANTAR FASCIITIS (ICD-728.71) Continues to have significant discomfort.  Plan: consider better shoe inserts (recommend [employer])  can resume/increase gabapentin  Her updated medication list for this problem includes:    Meloxicam 15 Mg Tabs (Meloxicam) .Marland Kitchen... Take one tablet daily or as needed  Problem # 3:  HYPERLIPIDEMIA (ICD-272.4) Tolerating medication well. LDL 86 c/w good control.  PLan: continue present medications.  Her updated medication list for this problem includes:    Simvastatin 40 Mg Tabs (Simvastatin) .Marland Kitchen... Take 1 tablet by mouth once a day  Problem # 4:  HYPERTENSION (ICD-401.9)  Her updated medication list for this problem includes:    Benicar Hct 40-25 Mg Tabs (Olmesartan medoxomil-hctz) ..... Qd  BP today: 124/82 Prior BP: 132/84 (06/09/2008)  Prior 10 Yr Risk Heart Disease: 5 % (06/09/2008)  Good control. Plan - continue present medication  Problem # 5:  Preventive Health Care (ICD-V70.0) Unremarkable history revealing medical stability except for on-going foot pain. Exam is unremarkable and lab results are fine. She is current with breast health; current with colorectal cancer screening. She is aware of the need for physical conditioning and health diet management.  In summary: a very pleasant woman who appears medically stable. She will return as needed or 6 months for an interval check-up.  Complete Medication List: 1)  Benicar Hct 40-25 Mg Tabs (Olmesartan medoxomil-hctz) .... Qd 2)  Gabapentin 300 Mg Tabs (Gabapentin) .... Take one tablet once daily 3)  Omeprazole 40 Mg Cpdr (Omeprazole) .Marland Kitchen..  1 qam as needed dyspepsia 4)  Simvastatin 40 Mg Tabs (Simvastatin) .... Take 1 tablet by mouth once a day 5)  Meloxicam 15 Mg Tabs (Meloxicam) .... Take one tablet daily or as needed 6)  Fluocinonide 0.05 % Gel (Fluocinonide) .... Use as needed 7)  Stool Softener  .... Take as needed 8)  Temazepam 15 Mg Caps (Temazepam) .Marland Kitchen.. 1 by mouth at bedtime prn 9)  Citalopram Hydrobromide 20 Mg Tabs (Citalopram hydrobromide) .... Once daily 10)  Anusol-hc 25 Mg Supp (Hydrocortisone acetate) .Marland Kitchen.. 1 pr two times a day x 6 days 11)  Amerge 1 Mg Tabs (Naratriptan hcl) 12)  Robaxin 500 Mg Tabs (Methocarbamol) .... One by mouth three times a day as needed back spasm 13)  B Complex 50 Tabs (B complex vitamins) .... Take 1 tablet by mouth once a day 14)  Fa-vitamin B-6-vitamin B-12 2.2-25-0.5 Mg Tabs (Folic acid-vit b6-vit b12) .... Take 1 tablet by mouth two times a day 15)  Ginkgo Biloba 120 Mg Tabs (Ginkgo biloba) .... Take 1 tablet by mouth once a day    Tests: (1) Lipid Panel (LIPID)   Cholesterol               168 mg/dL                   0-454     ATP III Classification            Desirable:  < 200 mg/dL                    Borderline High:  200 - 239 mg/dL               High:  > = 240 mg/dL   Triglycerides             69.0 mg/dL                  0.9-811.9     Normal:  <150 mg/dL     Borderline High:  147 - 199 mg/dL   HDL  67.90 mg/dL                 >16.10   VLDL Cholesterol          13.8 mg/dL                  9.6-04.5   LDL Cholesterol           86 mg/dL                    4-09  CHO/HDL Ratio:  CHD Risk                             2                    Men          Women     1/2 Average Risk     3.4          3.3     Average Risk          5.0          4.4     2X Average Risk          9.6          7.1     3X Average Risk          15.0          11.0                           Tests: (2) BMP (METABOL)   Sodium                    141 mEq/L                   135-145    Potassium                 3.9 mEq/L                   3.5-5.1   Chloride                  105 mEq/L                   96-112   Carbon Dioxide            32 mEq/L                    19-32   Glucose              [H]  101 mg/dL                   81-19   BUN                       22 mg/dL                    1-47   Creatinine                0.9 mg/dL                   8.2-9.5   Calcium                   9.3 mg/dL  8.4-10.5   GFR                       67.08 mL/min                >60  Tests: (3) CBC Platelet w/Diff (CBCD)   White Cell Count          5.5 K/uL                    4.5-10.5   Red Cell Count            4.49 Mil/uL                 3.87-5.11   Hemoglobin                13.8 g/dL                   04.5-40.9   Hematocrit                40.5 %                      36.0-46.0   MCV                       90.1 fl                     78.0-100.0   MCHC                      34.0 g/dL                   81.1-91.4   RDW                       12.7 %                      11.5-14.6   Platelet Count            229.0 K/uL                  150.0-400.0   Neutrophil %              58.8 %                      43.0-77.0   Lymphocyte %              30.1 %                      12.0-46.0   Monocyte %                7.2 %                       3.0-12.0   Eosinophils%              3.5 %                       0.0-5.0   Basophils %               0.4 %                       0.0-3.0   Neutrophill Absolute      3.2  K/uL                    1.4-7.7   Lymphocyte Absolute       1.7 K/uL                    0.7-4.0   Monocyte Absolute         0.4 K/uL                    0.1-1.0  Eosinophils, Absolute                             0.2 K/uL                    0.0-0.7   Basophils Absolute        0.0 K/uL                    0.0-0.1  Tests: (4) Hepatic/Liver Function Panel (HEPATIC)   Total Bilirubin           0.8 mg/dL                   1.6-1.0   Direct Bilirubin          0.1 mg/dL                   9.6-0.4    Alkaline Phospatase       58 U/L                      39-117   AST                       22 U/L                      0-37   ALT                       21 U/L                      0-35   Total Protein             6.3 g/dL                    5.4-0.9   Albumin                   3.8 g/dL                    8.1-1.9  Tests: (5) TSH (TSH)   FastTSH                   1.63 uIU/mL                 0.35-5.50  Tests: (6) UDip w/Micro (URINE)   Color                     LT. YELLOW       RANGE:  Yellow;Lt. Yellow   Clarity                   CLEAR                       Clear   Specific Gravity  1.020                       1.000 - 1.030   Urine Ph                  6.0                         5.0-8.0   Protein                   NEGATIVE                    Negative   Urine Glucose             NEGATIVE                    Negative   Ketones                   NEGATIVE                    Negative   Urine Bilirubin           NEGATIVE                    Negative   Blood                     NEGATIVE                    Negative   Urobilinogen              0.2                         0.0 - 1.0   Leukocyte Esterace        SMALL                       Negative   Nitrite                   NEGATIVE                    Negative   Urine WBC                 3-6/hpf                     0-2/hpf   Urine Mucus               Presence of                 None   Urine Epith               Rare(0-4/hpf)               NonePrescriptions: TEMAZEPAM 15 MG  CAPS (TEMAZEPAM) 1 by mouth at bedtime prn  #90 x 3   Entered and Authorized by:   Jacques Navy MD   Signed by:   Jacques Navy MD on 10/09/2008   Method used:   Print then Give to Patient   RxID:   [id number] CITALOPRAM HYDROBROMIDE 20 MG TABS (CITALOPRAM HYDROBROMIDE) once daily  #90 x 3   Entered and Authorized by:   Jacques Navy MD   Signed by:   Jacques Navy MD on  10/09/2008   Method used:   Print then Give to Patient   RxID:    [id number] MELOXICAM 15 MG  TABS (MELOXICAM) Take one tablet daily or as needed  #90 x 3   Entered and Authorized by:   Jacques Navy MD   Signed by:   Jacques Navy MD on 10/09/2008   Method used:   Print then Give to Patient   RxID:   [id number] SIMVASTATIN 40 MG  TABS (SIMVASTATIN) Take 1 tablet by mouth once a day  #90 x 3   Entered and Authorized by:   Jacques Navy MD   Signed by:   Jacques Navy MD on 10/09/2008   Method used:   Print then Give to Patient   RxID:   [id number] OMEPRAZOLE 40 MG  CPDR (OMEPRAZOLE) 1 qAM as needed dyspepsia  #90 x 3   Entered and Authorized by:   Jacques Navy MD   Signed by:   Jacques Navy MD on 10/09/2008   Method used:   Print then Give to Patient   RxID:   [id number] GABAPENTIN 300 MG  TABS (GABAPENTIN) Take one tablet once daily  #90 x 3   Entered and Authorized by:   Jacques Navy MD   Signed by:   Jacques Navy MD on 10/09/2008   Method used:   Print then Give to Patient   RxID:   [id number] BENICAR HCT 40-25 MG TABS (OLMESARTAN MEDOXOMIL-HCTZ) qd  #90 x 3   Entered and Authorized by:   Jacques Navy MD   Signed by:   Jacques Navy MD on 10/09/2008   Method used:   Print then Give to Patient   RxID:   [id number]

## 2010-08-12 NOTE — Assessment & Plan Note (Signed)
Summary: DISCUSS MEDS/NWS   Vital Signs:  Patient profile:   64 year old female Height:      64 inches Weight:      176 pounds BMI:     30.32 O2 Sat:      97 % on Room air Temp:     98.5 degrees F oral Pulse rate:   82 / minute BP sitting:   138 / 82  (left arm) Cuff size:   large  Vitals Entered By: Beola Cord, CMA (January 05, 2009 2:08 PM)  O2 Flow:  Room air  Primary Care Cataleya Cristina:  Jacques Navy MD   History of Present Illness: Patient here to discuss medication. She was on Benicar/Hct. She developed cramps that were diffuse. She stopped the medication and the cramps have diminished.  She has increased the gabapentin to 600mg  daily for foot pain: pain is in the arch of the foot. The pain is different than the pain of peripheral neuropathy. She has seen Dr. Lestine Box and has had radiographs. He has told her that she has arthritis across the tarsals. He has advised her ot have soft arch support. 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 Take 1 Tablet By Mouth Two Times A Day 2)  Omeprazole 40 Mg  Cpdr (Omeprazole) .Marland Kitchen.. 1 Qam As Needed Dyspepsia 3)  Simvastatin  40 Mg  Tabs (Simvastatin) .... Take 1 Tablet By Mouth Once A Day 4)  Meloxicam 15 Mg  Tabs (Meloxicam) .... Take One Tablet Daily or As Needed 5)  Fluocinonide 0.05 %  Gel (Fluocinonide) .... Use As Needed 6)  Stool Softener .... Take As Needed 7)  Temazepam 15 Mg  Caps (Temazepam) .Marland Kitchen.. 1 By Mouth At Bedtime Prn 8)  Citalopram Hydrobromide 20 Mg Tabs (Citalopram Hydrobromide) .... Once Daily 9)  Anusol-Hc 25 Mg  Supp (Hydrocortisone Acetate) .Marland Kitchen.. 1 Pr Two Times A Day X 6 Days 10)  Amerge 1 Mg Tabs (Naratriptan Hcl) 11)  Robaxin 500 Mg Tabs (Methocarbamol) .... One By Mouth Three Times A Day As Needed Back Spasm 12)  B Complex 50  Tabs (B Complex Vitamins) .... Take 1 Tablet By Mouth Once A Day 13)  Fa-Vitamin B-6-Vitamin B-12 2.2-25-0.5 Mg Tabs (Folic Acid-Vit B6-Vit B12) .... Take 1 Tablet By Mouth Two Times A Day 14)  Ginkgo Biloba 120 Mg Tabs (Ginkgo Biloba) .... Take 1 Tablet By Mouth Once A Day 15)  Tessalon Perles 100 Mg Caps (Benzonatate) .Marland Kitchen.. 1 Three Times A Day As Needed Cough 16)  Flonase 50 Mcg/act Susp (Fluticasone Propionate) .Marland Kitchen.. 1 Spray Each Nostril .qam 17)  Hydrochlorothiazide 25 Mg Tabs (Hydrochlorothiazide) .... Take 1 Tablet By Mouth Once A Day  Allergies (verified): 1)  ! Codeine  Review of Systems  The patient denies anorexia, fever, weight loss, weight gain, vision loss, chest pain, syncope, dyspnea on exertion, headaches, abdominal pain, muscle weakness, depression, and abnormal bleeding.    Physical Exam  General:  Well-developed,well-nourished,in no acute distress; alert,appropriate and cooperative throughout examination Head:  Normocephalic and atraumatic without obvious abnormalities. No apparent alopecia or balding. Lungs:  normal respiratory effort and normal breath sounds.   Heart:  normal rate and regular rhythm.   Msk:  feet without deformity, erythema, or limitation in ROM Neurologic:  alert & oriented X3 and cranial nerves II-XII intact.   Skin:   turgor normal and color normal.   Psych:  normally interactive, good eye contact, and not anxious appearing.     Impression & Recommendations:  Problem # 1:  OSTEOARTHROSIS UNSPEC WHETHER GEN/LOC ANK&FOOT (ICD-715.97) Has had evaluation by ortho. DJD is the diagnosis.  Plan: continue meloxicam         supportive shoes         loose weight         massage therapy may help         continue increased dose of gabapentin  Her updated medication list for this problem includes:    Meloxicam 15 Mg Tabs (Meloxicam) .Marland Kitchen... Take one tablet daily or as needed    Hydrocodone-acetaminophen 10-325 Mg Tabs (Hydrocodone-acetaminophen) .Marland Kitchen... 1 by mouth q 6 as needed  Problem # 2:  HYPERTENSION (ICD-401.9) Cramps better off benicar/hct. On HCT alone BP is acceptable  Plan: continue HC6t  The following medications were removed from the medication list:    Benicar Hct 40-25 Mg Tabs (Olmesartan medoxomil-hctz) ..... Qd Her updated medication list for this problem includes:    Hydrochlorothiazide 25 Mg Tabs (Hydrochlorothiazide) .Marland Kitchen... Take 1 tablet by mouth once a day  Problem # 3:  Hx of PERIPHERAL NEUROPATHY (ICD-356.9) Patient doing better on increased gabapentin  Plan: 600mg  two times a day is working better. New Rx provided.   Complete Medication List: 1)  Neurontin 300 Mg Caps (Gabapentin) .... Take 1 tablet by mouth two times a day 2)  Omeprazole 40 Mg Cpdr (Omeprazole) .Marland Kitchen.. 1 qam as needed dyspepsia 3)  Simvastatin 40 Mg Tabs (Simvastatin) .... Take 1 tablet by mouth once a day 4)  Meloxicam 15 Mg Tabs (Meloxicam) .... Take one tablet daily or as needed 5)  Fluocinonide 0.05 % Gel (Fluocinonide) .... Use as needed 6)  Stool Softener  .... Take as needed 7)  Temazepam 15 Mg Caps (Temazepam) .Marland Kitchen.. 1 by mouth at bedtime prn 8)  Citalopram Hydrobromide 20 Mg Tabs (Citalopram hydrobromide) .... Once daily 9)  Anusol-hc 25 Mg Supp (Hydrocortisone acetate) .Marland Kitchen.. 1 pr two times a day x 6 days 10)   Amerge 1 Mg Tabs (Naratriptan hcl) 11)  Robaxin 500 Mg Tabs (Methocarbamol) .... One by mouth three times a day as needed back spasm 12)  B Complex 50 Tabs (B complex vitamins) .... Take 1 tablet by mouth once a day 13)  Fa-vitamin B-6-vitamin B-12 2.2-25-0.5 Mg Tabs (Folic acid-vit b6-vit b12) .... Take 1 tablet by mouth two times a day 14)  Ginkgo Biloba 120 Mg Tabs (Ginkgo biloba) .... Take 1 tablet by mouth once a day 15)  Tessalon Perles 100 Mg Caps (Benzonatate) .Marland Kitchen.. 1 three times a day as needed cough 16)  Flonase 50 Mcg/act Susp (Fluticasone propionate) .Marland Kitchen.. 1 spray each nostril .qam 17)  Hydrochlorothiazide 25 Mg Tabs (Hydrochlorothiazide) .... Take 1 tablet by mouth once a  day 18)  Hydrocodone-acetaminophen 10-325 Mg Tabs (Hydrocodone-acetaminophen) .Marland Kitchen.. 1 by mouth q 6 as needed Prescriptions: HYDROCODONE-ACETAMINOPHEN 10-325 MG TABS (HYDROCODONE-ACETAMINOPHEN) 1 by mouth q 6 as needed  #90 x 0   Entered and Authorized by:   Jacques Navy MD   Signed by:   Jacques Navy MD on 01/05/2009   Method used:   Print then Give to Patient   RxID:   [id number] HYDROCHLOROTHIAZIDE 25 MG TABS (HYDROCHLOROTHIAZIDE) Take 1 tablet by mouth once a day  #90 x 3   Entered and Authorized by:   Jacques Navy MD   Signed by:   Jacques Navy MD on 01/05/2009   Method used:   Electronically to        [facility]* (mail-order)       [address] [place], [place]  [address]       Ph: [phone]       Fax: [phone]   RxID:   [id number] NEURONTIN 300 MG CAPS (GABAPENTIN) Take 1 tablet by mouth two times a day  #180 x 3   Entered and Authorized by:   Jacques Navy MD   Signed by:   Jacques Navy MD on 01/05/2009   Method used:   Electronically to        [facility]* (mail-order)       [address] [place], [place]  [address]       Ph: [phone]       Fax: [phone]   RxID:   [id number]

## 2010-08-12 NOTE — Progress Notes (Signed)
Summary: BP COUGH   Phone Note Call from Patient Call back at Work Phone [phone]   Summary of Call: Pt recently given new rx for BP and this is causing pt to have a dry cough.  Initial call taken by: Lamar Sprinkles,  Nov 12, 2007 10:50 AM  Follow-up for Phone Call        stop lisinopril. If cough goes away then OV to change meds. Follow-up by: Jacques Navy MD,  Nov 12, 2007 1:29 PM  Additional Follow-up for Phone Call Additional follow up Details #1::        Patient notified per MD. Will call back to make appt or notified if cough still persist Additional Follow-up by: Rock Nephew CMA,  Nov 12, 2007 1:58 PM

## 2010-08-12 NOTE — Consult Note (Signed)
Summary: [facility]  [facility]   Imported By: Maryln Gottron 01/10/2008 14:20:50  _____________________________________________________________________  External Attachment:    Type:   Image     Comment:   External Document

## 2010-08-12 NOTE — Assessment & Plan Note (Signed)
Summary: bp ck-men-$50-stc  Nurse Visit   Vital Signs:  Patient Profile:   65 Years Old Female CC:      BP check Height:     64 inches Pulse rate:   61 / minute BP sitting:   140 / 82  (left arm) Cuff size:   large  Vitals Entered By: Zackery Barefoot CMA (November 07, 2007 9:44 AM)                Pt is to continue with Lisinopril 10-12.5mg  per Dr. Debby Bud, per pt request copy of blood work was given.  Prior Medications: HYDROCHLOROTHIAZIDE 25 MG  TABS (HYDROCHLOROTHIAZIDE) Take one tablet once daily GABAPENTIN 300 MG  TABS (GABAPENTIN) Take one tablet once daily OMEPRAZOLE 40 MG  CPDR (OMEPRAZOLE) 1 qAM as needed dyspepsia SIMVASTATIN 40 MG  TABS (SIMVASTATIN) Take one tablet once daily MELOXICAM 15 MG  TABS (MELOXICAM) Take one tablet daily or as needed SUMATRIPTAN SUCCINATE 100 MG  TABS (SUMATRIPTAN SUCCINATE) 1 as needed h/a FLUOCINONIDE 0.05 %  GEL (FLUOCINONIDE) use as needed STOOL SOFTENER () Take as needed TEMAZEPAM 15 MG  CAPS (TEMAZEPAM) 1 by mouth at bedtime prn CITALOPRAM HYDROBROMIDE 10 MG  TABS (CITALOPRAM HYDROBROMIDE) Take 1 tablet by mouth once a day ANUSOL-HC 25 MG  SUPP (HYDROCORTISONE ACETATE) 1 pr two times a day x 6 days LISINOPRIL-HYDROCHLOROTHIAZIDE 10-12.5 MG  TABS (LISINOPRIL-HYDROCHLOROTHIAZIDE) 1 by mouth once daily Current Allergies: ! CODEINE    Orders Added: 1)  Est. Patient Level I [04540]    Prescriptions: LISINOPRIL-HYDROCHLOROTHIAZIDE 10-12.5 MG  TABS (LISINOPRIL-HYDROCHLOROTHIAZIDE) 1 by mouth once daily  #30 x 5   Entered by:   Zackery Barefoot CMA   Authorized by:   Jacques Navy MD   Signed by:   Zackery Barefoot CMA on 11/07/2007   Method used:   Electronically sent to ...       [id number]       [address]       [place], [place]  [address]       Ph: [phone] or [phone]       Fax: [phone]   RxID:   [id number]  ]

## 2010-08-12 NOTE — Progress Notes (Signed)
Summary: RESULTS  Phone Note Call from Patient   Summary of Call: Patient is requesting results of xray  Initial call taken by: Lamar Sprinkles, CMA,  April 30, 2009 10:59 AM  Follow-up for Phone Call        previous knee and ankle x-ray results are in my office note which should be in the mail or already delivered. Knees with minimal spurring and ankle OK. Lumbar spine films and DXA are not back/in EMR Follow-up by: Jacques Navy MD,  May 01, 2009 6:14 AM  Additional Follow-up for Phone Call Additional follow up Details #1::        left mess to call office back................................Marland KitchenLamar Sprinkles, CMA  May 01, 2009 10:31 AM     Additional Follow-up for Phone Call Additional follow up Details #2::    Pt informed  Follow-up by: Lamar Sprinkles, CMA,  May 01, 2009 11:18 AM

## 2010-08-12 NOTE — Assessment & Plan Note (Signed)
Summary: DR MEN PT --$50   R EAR STOPPED CONGEST-STC   Vital Signs:  Patient profile:   65 year old female Height:      64 inches (162.56 cm) Weight:      170.0 pounds (77.27 kg) O2 Sat:      94 % Temp:     98.4 degrees F (36.89 degrees C) oral Pulse rate:   70 / minute BP sitting:   132 / 84  (left arm) Cuff size:   regular  Vitals Entered By: Orlan Leavens (October 15, 2008 10:18 AM) CC: HEAD CONGESTED AND (R) EAR STOPPED UP, URI symptoms   Primary Care Provider:  Jacques Navy MD  CC:  HEAD CONGESTED AND (R) EAR STOPPED UP and URI symptoms.  History of Present Illness:  URI Symptoms      This is a 65 year old woman who presents with URI symptoms.  The patient reports purulent nasal discharge, sore throat, dry cough, productive cough, R earache, and sick contacts (spouse), but denies nasal congestion or sinus pressure/pain.  The patient denies fever, dyspnea, and wheezing.  The patient also reports sneezing and severe fatigue.  The patient denies seasonal symptoms and response to antihistamine.  The patient denies the following risk factors for Strep sinusitis: poor response to decongestant and  no exposure to children. sxs duration >2wks  Preventive Screening-Counseling & Management     Smoking Status: quit > 6 months     Year Quit: 1998  Current Medications (verified): 1)  Benicar Hct 40-25 Mg Tabs (Olmesartan Medoxomil-Hctz) .... Qd 2)  Gabapentin 300 Mg  Tabs (Gabapentin) .... Take One Tablet Once Daily 3)  Omeprazole 40 Mg  Cpdr (Omeprazole) .Marland Kitchen.. 1 Qam As Needed Dyspepsia 4)  Simvastatin 40 Mg  Tabs (Simvastatin) .... Take 1 Tablet By Mouth Once A Day 5)  Meloxicam 15 Mg  Tabs (Meloxicam) .... Take One Tablet Daily or As Needed 6)  Fluocinonide 0.05 %  Gel (Fluocinonide) .... Use As Needed 7)  Stool Softener .... Take As Needed 8)  Temazepam 15 Mg  Caps (Temazepam) .Marland Kitchen.. 1 By Mouth At Bedtime Prn 9)  Citalopram Hydrobromide 20 Mg Tabs (Citalopram Hydrobromide) .... Once  Daily 10)  Anusol-Hc 25 Mg  Supp (Hydrocortisone Acetate) .Marland Kitchen.. 1 Pr Two Times A Day X 6 Days 11)  Amerge 1 Mg Tabs (Naratriptan Hcl) 12)  Robaxin 500 Mg Tabs (Methocarbamol) .... One By Mouth Three Times A Day As Needed Back Spasm 13)  B Complex 50  Tabs (B Complex Vitamins) .... Take 1 Tablet By Mouth Once A Day 14)  Fa-Vitamin B-6-Vitamin B-12 2.2-25-0.5 Mg Tabs (Folic Acid-Vit B6-Vit B12) .... Take 1 Tablet By Mouth Two Times A Day 15)  Ginkgo Biloba 120 Mg Tabs (Ginkgo Biloba) .... Take 1 Tablet By Mouth Once A Day 16)  Tessalon Perles 100 Mg Caps (Benzonatate) .Marland Kitchen.. 1 Three Times A Day As Needed Cough  Allergies (verified): 1)  ! Codeine  Past History:  Past Medical History:    Reviewed history from 05/22/2008 and no changes required:    Hx of PERIPHERAL NEUROPATHY (ICD-356.9)    FIBROMYALGIA (ICD-729.1)    PLANTAR FASCIITIS (ICD-728.71)    HYPERLIPIDEMIA (ICD-272.4)    DEGENERATIVE JOINT DISEASE, CERVICAL SPINE (ICD-721.90)    GASTROESOPHAGEAL REFLUX DISEASE (ICD-530.81)    DISC DISEASE, LUMBAR (ICD-722.52)    MIGRAINES, HX OF (ICD-V13.8)    HYPERTENSION (ICD-401.9)    Depression  Past Surgical History:    Reviewed history from  04/05/2007 and no changes required:    APPENDECTOMY, HX OF (ICD-V45.79)    OVARIAN CYSTECTOMY, HX OF (ICD-V45.89)       Family History:    Reviewed history from 10/09/2008 and no changes required:       father- deceased @ 58: CAd/MI-fatal (3rd)       mother -deceased @ 57: old age       Family History of Alcoholism/Addiction       Neg-colon cancer, DM       2 paternal aunts with breast cancer  Social History:    Reviewed history from 10/09/2008 and no changes required:       HS[employer], [employer]ology school, AA degree [employer]       Married-'64 - 44yrs/divorced; married '98       no children       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    Ears:  R TM erythema and R decreased hearing.   Nose:  External nasal examination shows no deformity or inflammation. Nasal mucosa are pink and moist without lesions or exudates. Mouth:  Oral mucosa and oropharynx without lesions or exudates.  Teeth in good repair. Neck:  No deformities, masses, or tenderness noted. Chest Wall:  No deformities, masses, or tenderness noted. Lungs:  Normal respiratory effort, chest expands symmetrically. Lungs are clear to auscultation, no crackles or wheezes. Heart:  Normal rate and regular rhythm. S1 and S2 normal without gallop, murmur, click, rub or other extra sounds. Extremities:  No clubbing, cyanosis, edema, or deformity noted with normal full range of motion of all joints.   Psych:  Cognition and judgment appear intact. Alert and cooperative with normal attention span and concentration. No apparent delusions, illusions, hallucinations   Impression & Recommendations:  Problem # 1:  OTITIS MEDIA, RIGHT (ICD-382.9)  given sxs duration over 2 wks and failure to respond to OTC decongestions and Afrin, will treat with 7 d abx plus nasal steroid  Her updated medication list for this problem includes:    Augmentin 500-125 Mg Tab (Amoxicillin-pot clavulanate) .Marland Kitchen... Take 1 capsule by mouth three times a day x 7 days  Orders: Prescription Created Electronically [phone])  Complete Medication List: 1)  Benicar Hct 40-25 Mg Tabs (Olmesartan medoxomil-hctz) .... Qd 2)  Gabapentin 300 Mg Tabs (Gabapentin) .... Take one tablet once daily 3)  Omeprazole 40 Mg Cpdr (Omeprazole) .Marland Kitchen.. 1 qam as needed dyspepsia 4)  Simvastatin 40 Mg Tabs (Simvastatin) .... Take 1 tablet by mouth once a day 5)  Meloxicam 15 Mg Tabs (Meloxicam) .... Take one tablet daily or as needed 6)  Fluocinonide 0.05 % Gel (Fluocinonide) .... Use as needed 7)  Stool Softener  .... Take as needed 8)  Temazepam 15 Mg Caps (Temazepam) .Marland Kitchen.. 1 by mouth  at bedtime prn 9)  Citalopram Hydrobromide 20 Mg Tabs (Citalopram hydrobromide) .... Once daily 10)  Anusol-hc 25 Mg Supp (Hydrocortisone acetate) .Marland Kitchen.. 1 pr two times a day x 6 days 11)  Amerge 1 Mg Tabs (Naratriptan hcl) 12)  Robaxin 500 Mg Tabs (Methocarbamol) .... One by mouth three times a day as needed back spasm 13)  B Complex 50 Tabs (B complex vitamins) .... Take 1 tablet by mouth once a day 14)  Fa-vitamin B-6-vitamin B-12 2.2-25-0.5 Mg Tabs (Folic acid-vit b6-vit b12) .... Take 1 tablet by mouth  two times a day 15)  Ginkgo Biloba 120 Mg Tabs (Ginkgo biloba) .... Take 1 tablet by mouth once a day 16)  Tessalon Perles 100 Mg Caps (Benzonatate) .Marland Kitchen.. 1 three times a day as needed cough 17)  Augmentin 500-125 Mg Tab (Amoxicillin-pot clavulanate) .... Take 1 capsule by mouth three times a day x 7 days 18)  Flonase 50 Mcg/act Susp (Fluticasone propionate) .Marland Kitchen.. 1 spray each nostril .qam  Patient Instructions: 1)  take augmentin (antibiotic) as instructed next 7 days 2)  Get plenty of rest, drink lots of clear liquids, and use Tylenol or Ibuprofen for fever and comfort.  3)  Continue Sudafed and Tesslon perles as before 4)  call if unimproved next 7-10d Prescriptions: FLONASE 50 MCG/ACT SUSP (FLUTICASONE PROPIONATE) 1 spray each nostril .qam  #1 x 1   Entered and Authorized by:   Newt Lukes MD   Signed by:   Newt Lukes MD on 10/15/2008   Method used:   [facility]        [id number]       [address]       [place], [place]  [address]       Ph: [phone] or [phone]       Fax: [phone]   RxID:   [id number] AUGMENTIN 500-125 MG TAB (AMOXICILLIN-POT CLAVULANATE) Take 1 capsule by mouth three times a day X 7 days  #21 x 0   Entered and Authorized by:   Newt Lukes MD   Signed by:   Newt Lukes MD on 10/15/2008   Method used:   [facility]        [id number]        [address]       [place], [place]  [address]       Ph: [phone] or [phone]       Fax: [phone]   RxID:   [id number]

## 2010-08-12 NOTE — Progress Notes (Signed)
Summary: Rf   Phone Note Call from Patient   Summary of Call: Patient is requesting refill of hydrocodone, says she gets refill about once a year.  Initial call taken by: Lamar Sprinkles,  December 15, 2008 4:54 PM  Follow-up for Phone Call        Not on current or old med listl; codeine is listed as a drug allergy. Will not authorize refill. Will need to be seen for evaluation of pain and discuss drug allergy Follow-up by: Jacques Navy MD,  December 16, 2008 10:21 AM  Additional Follow-up for Phone Call Additional follow up Details #1::        Patient made aware. Patient to schedule an appointment. Additional Follow-up by: Beola Cord, CMA,  December 17, 2008 9:03 AM

## 2010-08-12 NOTE — Letter (Signed)
Summary: Handout Printed  Printed Handout:  - *Patient Instructions 

## 2010-09-03 ENCOUNTER — Telehealth: Payer: Self-pay | Admitting: Internal Medicine

## 2010-09-07 NOTE — Progress Notes (Signed)
  Phone Note Refill Request Message from:  Fax from Pharmacy on September 03, 2010 11:28 AM  Refills Requested: Medication #1:  CRESTOR 5 MG TABS every other day. Initial call taken by: Ami Bullins CMA,  September 03, 2010 11:28 AM    Prescriptions: CRESTOR 5 MG TABS (ROSUVASTATIN CALCIUM) every other day  #90 x 1   Entered by:   Ami Bullins CMA   Authorized by:   Jacques Navy MD   Signed by:   Bill Salinas CMA on 09/03/2010   Method used:   [facility]        [id number]       [address]       [place], [place]  [address]       Ph: [phone] or [phone]       Fax: [phone]   RxID:   [id number]

## 2010-09-23 ENCOUNTER — Encounter: Payer: Self-pay | Admitting: Internal Medicine

## 2010-09-30 ENCOUNTER — Other Ambulatory Visit: Payer: Self-pay | Admitting: Cardiology

## 2010-09-30 DIAGNOSIS — I6529 Occlusion and stenosis of unspecified carotid artery: Secondary | ICD-10-CM

## 2010-10-01 ENCOUNTER — Encounter (INDEPENDENT_AMBULATORY_CARE_PROVIDER_SITE_OTHER): Payer: Medicare Other | Admitting: *Deleted

## 2010-10-01 ENCOUNTER — Telehealth: Payer: Self-pay | Admitting: *Deleted

## 2010-10-01 DIAGNOSIS — I6529 Occlusion and stenosis of unspecified carotid artery: Secondary | ICD-10-CM

## 2010-10-01 MED ORDER — TEMAZEPAM 15 MG PO CAPS: 15.0000 mg | ORAL_CAPSULE | Freq: Every evening | ORAL | Status: DC | PRN

## 2010-10-01 NOTE — Telephone Encounter (Signed)
Patient informed. 

## 2010-10-01 NOTE — Telephone Encounter (Signed)
Patient requesting refill of temazepam 15mg  1 qhs # 30, [facility] [address] Ok?

## 2010-10-01 NOTE — Telephone Encounter (Signed)
Ok for refill x 5 

## 2010-10-13 ENCOUNTER — Encounter: Payer: Self-pay | Admitting: Cardiology

## 2010-10-22 ENCOUNTER — Telehealth: Payer: Self-pay | Admitting: *Deleted

## 2010-10-22 NOTE — Telephone Encounter (Signed)
Carotid u/s stable carotid disease bilat, f/u 1 year, called pt and Left message to call back  For results

## 2010-10-22 NOTE — Telephone Encounter (Signed)
Pt returning nurse call. Pt would like to know test results.

## 2010-10-26 NOTE — Telephone Encounter (Signed)
Pt given carotid results 

## 2010-11-08 ENCOUNTER — Encounter: Payer: Self-pay | Admitting: Internal Medicine

## 2010-11-08 ENCOUNTER — Telehealth: Payer: Self-pay | Admitting: Internal Medicine

## 2010-11-08 ENCOUNTER — Other Ambulatory Visit: Payer: Self-pay | Admitting: *Deleted

## 2010-11-08 ENCOUNTER — Telehealth: Payer: Self-pay | Admitting: *Deleted

## 2010-11-08 ENCOUNTER — Ambulatory Visit (INDEPENDENT_AMBULATORY_CARE_PROVIDER_SITE_OTHER): Payer: Medicare Other | Admitting: Internal Medicine

## 2010-11-08 VITALS — BP 140/80 | HR 88 | Temp 98.9°F | Resp 16 | Wt 165.0 lb

## 2010-11-08 DIAGNOSIS — L247 Irritant contact dermatitis due to plants, except food: Secondary | ICD-10-CM

## 2010-11-08 DIAGNOSIS — L255 Unspecified contact dermatitis due to plants, except food: Secondary | ICD-10-CM

## 2010-11-08 DIAGNOSIS — Z87898 Personal history of other specified conditions: Secondary | ICD-10-CM

## 2010-11-08 MED ORDER — NARATRIPTAN HCL 2.5 MG PO TABS: 2.5000 mg | ORAL_TABLET | ORAL | Status: DC | PRN

## 2010-11-08 MED ORDER — DEXAMETHASONE 1.5 MG PO KIT: 1.0000 | PACK | ORAL | Status: DC

## 2010-11-08 MED ORDER — PREDNISONE (PAK) 10 MG PO TABS: 10.0000 mg | ORAL_TABLET | Freq: Every day | ORAL | Status: AC

## 2010-11-08 MED ORDER — HYDROXYZINE HCL 25 MG PO TABS: 25.0000 mg | ORAL_TABLET | Freq: Three times a day (TID) | ORAL | Status: AC | PRN

## 2010-11-08 MED ORDER — CLOBETASOL PROPIONATE 0.05 % EX OINT: TOPICAL_OINTMENT | Freq: Two times a day (BID) | CUTANEOUS | Status: DC

## 2010-11-08 NOTE — Assessment & Plan Note (Signed)
Will treat with steroids systemically and topically, control the itching with atarax

## 2010-11-08 NOTE — Patient Instructions (Signed)
Poison Ivy (Toxicodendron Dermatitis) Poison ivy is a inflammation of the skin (contact dermatitis) caused by touching the allergens on the leaves of the ivy plant following previous exposure to the plant. The rash usually appears 48 hours after exposure. The rash is usually bumps (papules) or blisters (vesicles) in a linear pattern. Depending on your own sensitivity, the rash may simply cause redness and itching, or it may also progress to blisters which may break open. These must be well cared for to prevent secondary bacterial (germ) infection, followed by scarring. Keep any open areas dry, clean, dressed, and covered with an antibacterial ointment if needed. The eyes may also get puffy. The puffiness is worst in the morning and gets better as the day progresses. This dermatitis usually heals without scarring, within 2 to 3 weeks without treatment. HOME CARE INSTRUCTIONS Thoroughly wash with soap and water as soon as you have been exposed to poison ivy. You have about one half hour to remove the plant resin before it will cause the rash. This washing will destroy the oil or antigen on the skin that is causing, or will cause, the rash. Be sure to wash under your fingernails as any plant resin there will continue to spread the rash. Do not rub skin vigorously when washing affected area. Poison ivy cannot spread if no oil from the plant remains on your body. A rash that has progressed to weeping sores will not spread the rash unless you have not washed thoroughly. It is also important to wash any clothes you have been wearing as these may carry active allergens. The rash will return if you wear the unwashed clothing, even several days later. Avoidance of the plant in the future is the best measure. Poison ivy plant can be recognized by the number of leaves. Generally, poison ivy has three leaves with flowering branches on a single stem. Diphenhydramine may be purchased over the counter and used as needed for  itching. Do not drive with this medication if it makes you drowsy. Ask your caregiver about medication for children. SEEK MEDICAL CARE IF   Open sores develop.   Redness spreads beyond area of rash.   You notice purulent (pus-like) discharge.   You have increased pain.   Other signs of infection develop (such as fever).  Document Released: 06/24/2000 Document Re-Released: 06/15/2009 Exit[employer] Patient Information 2011 [employer]. 

## 2010-11-08 NOTE — Telephone Encounter (Signed)
abstract

## 2010-11-08 NOTE — Telephone Encounter (Signed)
Patient requesting rx for poison ivy that is on her hands, wrist, neck and chin. Advised I could wait to see what MD suggested tomorrow but she wanted OV w/any MD avail today - transferred to scheduler for OV today

## 2010-11-08 NOTE — Assessment & Plan Note (Signed)
She asked for and was given an Rx for amerge

## 2010-11-08 NOTE — Progress Notes (Signed)
  Subjective:    Patient ID: [name], female    DOB: 1946-01-10, 65 y.o.   MRN: [id number]  Rash This is a new problem. The current episode started yesterday. The problem has been rapidly worsening since onset. The affected locations include the face, neck, left wrist and right wrist. The rash is characterized by itchiness. She was exposed to plant contact. Pertinent negatives include no anorexia, congestion, cough, diarrhea, eye pain, facial edema, fatigue, fever, joint pain, nail changes, rhinorrhea, shortness of breath, sore throat or vomiting. Past treatments include anti-itch cream. The treatment provided no relief.      Review of Systems  Constitutional: Negative for fever, chills, diaphoresis, activity change, appetite change, fatigue and unexpected weight change.  HENT: Negative for congestion, sore throat, rhinorrhea, neck pain and neck stiffness.   Eyes: Negative for pain.  Respiratory: Negative for apnea, cough, choking, chest tightness, shortness of breath, wheezing and stridor.   Cardiovascular: Negative for chest pain, palpitations and leg swelling.  Gastrointestinal: Negative for nausea, vomiting, abdominal pain, diarrhea, constipation, blood in stool and anorexia.  Musculoskeletal: Negative for back pain, joint pain, joint swelling, arthralgias and gait problem.  Skin: Positive for rash. Negative for nail changes.  Neurological: Negative for dizziness, tremors, seizures, syncope, facial asymmetry, speech difficulty, weakness, light-headedness, numbness and headaches.  Hematological: Negative for adenopathy. Does not bruise/bleed easily.       Objective:   Physical Exam  [vitalsreviewed. Constitutional: She is oriented to person, place, and time. She appears well-developed and well-nourished. No distress.  HENT:  Head: Normocephalic and atraumatic.  Right Ear: External ear normal.  Left Ear: External ear normal.  Nose: Nose normal.  Mouth/Throat: Oropharynx is  clear and moist. No oropharyngeal exudate.  Eyes: Conjunctivae and EOM are normal. Pupils are equal, round, and reactive to light. Right eye exhibits no discharge. Left eye exhibits no discharge. No scleral icterus.  Neck: Normal range of motion. Neck supple. No JVD present. No tracheal deviation present. No thyromegaly present.  Cardiovascular: Normal rate, regular rhythm, normal heart sounds and intact distal pulses.  Exam reveals no gallop and no friction rub.   No murmur heard. Pulmonary/Chest: Breath sounds normal. No respiratory distress. She has no wheezes. She has no rales. She exhibits no tenderness.  Abdominal: Soft. Bowel sounds are normal. She exhibits no distension and no mass. There is no tenderness. There is no rebound and no guarding.  Musculoskeletal: Normal range of motion. She exhibits no edema and no tenderness.  Neurological: She is alert and oriented to person, place, and time. She has normal reflexes.  Skin: Skin is warm and dry. Rash noted. No petechiae and no purpura noted. Rash is papular and vesicular. Rash is not macular, not nodular, not pustular and not urticarial. She is not diaphoretic. No cyanosis. No pallor. Nails show no clubbing.     Psychiatric: She has a normal mood and affect. Her behavior is normal. Judgment and thought content normal.          Assessment & Plan:

## 2010-11-17 ENCOUNTER — Telehealth: Payer: Self-pay | Admitting: *Deleted

## 2010-11-17 NOTE — Telephone Encounter (Signed)
PA requested for DexPak PA form requested from [employer] @[phone] Completed PA form faxed for Approval [11/11/10] Approval recvd & faxed to pharmacy [11/12/10] upon returning from [employer].

## 2010-11-18 ENCOUNTER — Encounter: Payer: Self-pay | Admitting: Cardiology

## 2010-11-19 ENCOUNTER — Encounter: Payer: Self-pay | Admitting: Cardiology

## 2010-11-19 DIAGNOSIS — Z79899 Other long term (current) drug therapy: Secondary | ICD-10-CM | POA: Insufficient documentation

## 2010-11-22 ENCOUNTER — Encounter: Payer: Self-pay | Admitting: Internal Medicine

## 2010-11-22 ENCOUNTER — Ambulatory Visit (INDEPENDENT_AMBULATORY_CARE_PROVIDER_SITE_OTHER): Payer: Medicare Other | Admitting: Cardiology

## 2010-11-22 ENCOUNTER — Ambulatory Visit (INDEPENDENT_AMBULATORY_CARE_PROVIDER_SITE_OTHER): Payer: Medicare Other | Admitting: Internal Medicine

## 2010-11-22 ENCOUNTER — Encounter: Payer: Self-pay | Admitting: Cardiology

## 2010-11-22 VITALS — BP 120/68 | HR 65 | Temp 98.2°F | Wt 162.0 lb

## 2010-11-22 DIAGNOSIS — L282 Other prurigo: Secondary | ICD-10-CM

## 2010-11-22 DIAGNOSIS — G609 Hereditary and idiopathic neuropathy, unspecified: Secondary | ICD-10-CM

## 2010-11-22 DIAGNOSIS — J069 Acute upper respiratory infection, unspecified: Secondary | ICD-10-CM

## 2010-11-22 DIAGNOSIS — E785 Hyperlipidemia, unspecified: Secondary | ICD-10-CM

## 2010-11-22 DIAGNOSIS — I739 Peripheral vascular disease, unspecified: Secondary | ICD-10-CM

## 2010-11-22 DIAGNOSIS — R609 Edema, unspecified: Secondary | ICD-10-CM

## 2010-11-22 DIAGNOSIS — R079 Chest pain, unspecified: Secondary | ICD-10-CM

## 2010-11-22 DIAGNOSIS — I10 Essential (primary) hypertension: Secondary | ICD-10-CM

## 2010-11-22 DIAGNOSIS — I779 Disorder of arteries and arterioles, unspecified: Secondary | ICD-10-CM | POA: Insufficient documentation

## 2010-11-22 MED ORDER — GABAPENTIN 300 MG PO CAPS: 300.0000 mg | ORAL_CAPSULE | Freq: Two times a day (BID) | ORAL | Status: DC

## 2010-11-22 MED ORDER — BENZONATATE 100 MG PO CAPS: 100.0000 mg | ORAL_CAPSULE | Freq: Three times a day (TID) | ORAL | Status: DC | PRN

## 2010-11-22 MED ORDER — AZITHROMYCIN 250 MG PO TABS: ORAL_TABLET | ORAL | Status: AC

## 2010-11-22 MED ORDER — AZITHROMYCIN 250 MG PO TABS: ORAL_TABLET | ORAL | Status: DC

## 2010-11-22 NOTE — Patient Instructions (Signed)
Your physician recommends that you schedule a follow-up appointment in: 12 months with Dr Katz 

## 2010-11-22 NOTE — Assessment & Plan Note (Signed)
Blood pressure is mildly elevated today.  She has a blood pressure cuff at home but does not check her pressure frequently.  I have encouraged her to check her blood pressure regularly and be in touch with her primary physician to see if she needs any further med adjustments.

## 2010-11-22 NOTE — Assessment & Plan Note (Signed)
She is not having any chest pain that sounds like cardiac ischemia.  She had a nuclear scan in 2006.  I feel we do not need to repeat any studies at this time.

## 2010-11-22 NOTE — Assessment & Plan Note (Signed)
Fortunately her carotid disease remains quite stable.  On this basis her carotid Dopplers have now been removed to a once yearly basis.

## 2010-11-22 NOTE — Assessment & Plan Note (Signed)
In the past the patient felt poorly with simvastatin.  She has been able to tolerate low-dose Crestor.  I will to her primary physician to follow up her labs.  If she is not at goal it would be reasonable to increase the frequency of her Crestor.

## 2010-11-22 NOTE — Patient Instructions (Signed)
URI - persistent sympotms. Plan - azithromycin as direcrted ( antibiotic) 5 days of medication = 10 days of coverage. Use the tessalon perles and may also take Robrtussin Dm or the generic equivalent. For rash and itching: ok to use the steroid cream. For itching take otc loratadine 10mg  once a day and ranitidine 150mg  AM and PM. Call if your symptoms don't get better.

## 2010-11-22 NOTE — Assessment & Plan Note (Signed)
She does not have any significant edema at this time.  No further workup.

## 2010-11-22 NOTE — Progress Notes (Signed)
HPI Patient is seen for followup History some chest pain.  She has hypertension and carotid artery disease.  She's recently had an upper respiratory infection.  She had some pain that radiates from her back.  This usually occurs the day after she has worked in a [employer].  It sounds musculoskeletal.  She has had very careful followup of her carotid Dopplers.  She has significant disease but has been stable.  Therefore she is now being assessed once yearly. Allergies  Allergen Reactions  . Celebrex (Celecoxib)   . Codeine     Current Outpatient Prescriptions  Medication Sig Dispense Refill  . aspirin 81 MG tablet Take 81 mg by mouth daily.        . benzonatate (TESSALON) 100 MG capsule Take 100 mg by mouth 3 (three) times daily as needed.        Jennette Banker Sodium 30-100 MG CAPS Take by mouth.        . citalopram (CELEXA) 20 MG tablet Take 20 mg by mouth daily.        . clobetasol (TEMOVATE) 0.05 % ointment Apply topically 2 (two) times daily.  60 g  0  . Docusate Calcium (STOOL SOFTENER PO) Take by mouth as needed.        . fluocinonide (LIDEX) 0.05 % gel Apply topically as needed.        . fluticasone (FLONASE) 50 MCG/ACT nasal spray 2 sprays by Nasal route daily.        . Folic Acid-Vit B6-Vit B12 (FA-VITAMIN B-6-VITAMIN B-12) 2.2-25-0.5 MG TABS Take by mouth daily.        Marland Kitchen gabapentin (NEURONTIN) 300 MG capsule Take 300 mg by mouth 2 (two) times daily.        . hydrochlorothiazide 25 MG tablet Take 25 mg by mouth daily.        Marland Kitchen HYDROcodone-acetaminophen (NORCO) 10-325 MG per tablet Take 1 tablet by mouth every 6 (six) hours as needed.        . hydrocortisone (ANUSOL-HC) 25 MG suppository Place 25 mg rectally 2 (two) times daily.        . meloxicam (MOBIC) 15 MG tablet Take 15 mg by mouth daily.        . methocarbamol (ROBAXIN) 500 MG tablet Take 500 mg by mouth 3 (three) times daily.        . naratriptan (AMERGE) 1 MG TABS Take 1 mg by mouth once as needed. 1 as needed h/a,  may repeat one more in 2 hours. MAX 2 tabs in 24 hours  *Brand Name Only*       . naratriptan (AMERGE) 2.5 MG tablet Take 1 tablet (2.5 mg total) by mouth as needed for migraine. Take one (1) tablet at onset of headache; if returns or does not resolve, may repeat after 4 hours; do not exceed five (5) mg in 24 hours.  10 tablet  0  . omeprazole (PRILOSEC) 40 MG capsule Take 40 mg by mouth daily.        . rosuvastatin (CRESTOR) 5 MG tablet Take 5 mg by mouth daily. 3 times weekly      . temazepam (RESTORIL) 15 MG capsule Take 15 mg by mouth at bedtime as needed.        Marland Kitchen DISCONTD: Dexamethasone (DEXPAK JR 10 DAY) 1.5 MG KIT Take 1 kit (1.5 mg total) by mouth as directed.  1 kit  0    History   Social History  . Marital Status: Married  Spouse Name: N/A    Number of Children: 0  . Years of Education: N/A   Occupational History  .     Social History Main Topics  . Smoking status: Former Smoker    Quit date: 07/11/1996  . Smokeless tobacco: Not on file  . Alcohol Use: Not on file  . Drug Use: No  . Sexually Active: Not on file   Other Topics Concern  . 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 Coronary artery disease Father   . Heart attack Father   . Diabetes Neg Hx   . Colon cancer Neg Hx   . Breast cancer Paternal Aunt     Past Medical History  Diagnosis Date  . Peripheral neuropathy   . Fibromyalgia   . Hyperlipemia   . Degenerative joint disease     cervical spine  . GERD (gastroesophageal reflux disease)   . Lumbar disc disease   . History of migraines   . Hypertension   . Depression   . Carotid artery disease     Doppler, April, 2012,  0-39% R. ICA, 60-79% LICA... no significant change... frequency of Doppler moved to yearly because of stability    . Chest pain     Nuclear stress test 2006 normal  . Hx of colonoscopy   . Drug therapy     msucle aches simva, but tolerates 5mg  Creastor    Past Surgical History  Procedure Date  . Appendectomy   . Laparoscopic ovarian cystectomy     ROS  Patient denies fever, chills, headache, sweats, rash, change in vision, change in hearing, chest pain, cough, nausea vomiting, urinary symptoms.  All the systems are reviewed and are negative.  PHYSICAL EXAM Patient is stable today.  She is oriented to person time and place.  Affect is normal.  Head is atraumatic.  There is no jugular venous distention.  Lungs are clear.  Respiratory effort is nonlabored.  Cardiac exam reveals an S1-S2.  There are no clicks or significant murmurs.  The abdomen is soft.  There is no peripheral edema.  There are no musculoskeletal deformities.  There are no skin rashes.  Filed Vitals:   11/22/10 1014  BP: 152/72  Pulse: 72  Resp: 18  Height: 5\' 2"  (1.575 m)  Weight: 162 lb (73.483 kg)    EKG   EKG is done today and reviewed by me.  The EKG is normal.  ASSESSMENT & PLAN

## 2010-11-23 NOTE — Assessment & Plan Note (Signed)
[facility]                            CARDIOLOGY OFFICE NOTE   Vasquez, Catherine                    MRN:          [id number]  DATE:11/28/2007                            DOB:          1946-01-20    [name] is here for cardiology followup.  I saw her last in August  of 2007.  She is not having any chest discomfort.  She had a normal  Myoview in 2006.  She does have carotid disease and this has been  followed.  Her most recent study in February of 2009 reveals that she  has mild disease on the right and moderate disease on the left; with  some mild changes on the right the next study is recommended at 6  months.  She and I talked about this.  She is not having any symptoms.   The patient thought that her blood pressure was increased recently and  she saw Dr. Debby Bud.  Lisinopril was added to her meds.  Unfortunately  she developed a cough.  She was in touch with Dr. Debby Bud who had her  hold her lisinopril.  The cough is decreasing.  She put herself back on  hydrochlorothiazide that had been put on hold.  Today her blood pressure  is quite good.   PAST MEDICAL HISTORY:  Allergies - CODEINE.   MEDICATIONS:  1. Hydrochlorothiazide 25.  2. Omeprazole.  3. Simvastatin.  4. Meloxicam.  5. Citalopram.   OTHER MEDICAL PROBLEMS:  See the list below.   REVIEW OF SYSTEMS:  She feels fine today and her review of systems is  negative.   PHYSICAL EXAM:  VITAL SIGNS:  Weight is 157 pounds.  Blood pressure  119/71 with a pulse of 91.  GENERAL:  The patient is oriented to person, time and place.  Affect is  normal.  HEENT:  Reveals no xanthelasma.  She has normal extraocular motion.  NECK:  There are soft carotid bruits.  There is no jugular venous  distention.  LUNGS:  Lungs are clear.  Respiratory effort is not labored.  CARDIAC:  Exam reveals an S1 with an S2.  There are no clicks or  significant murmurs.  ABDOMEN:  The abdomen is soft.   There is no peripheral edema.   IMPRESSION:  1. History of peripheral neuropathy.  2. Gastroesophageal reflux disease.  3. Elevated cholesterol with treatment followed by Dr. Debby Bud.  4. History of disk disease and spinal stenosis.  5. Question of fibromyalgia.  6. Moderate carotid disease and she needs followup now at 6 months.  7. Family history of coronary artery disease.  8. History of some depression, stable.  9. Insomnia, stable.  10.Hypertension.  See the discussion above.   I have encouraged her to stay on her hydrochlorothiazide and off  lisinopril.  She will make an appointment to see Dr. Debby Bud back but she  will move it further out so that he can now recheck her blood pressure.     Luis Abed, MD, [facility]  Electronically Signed    JDK/MedQ  DD: 11/28/2007  DT: 11/28/2007  Job #:  [id number]   cc:   Rosalyn Gess. Norins, MD

## 2010-11-23 NOTE — Progress Notes (Signed)
Subjective:    Patient ID: [name], female    DOB: June 27, 1946, 65 y.o.   MRN: [id number]  HPI Mrs. Cocerham is a 65 y/o white woman wo presents with h/o sore throat, chest congestion with a productive cough of yellow thick phlegm for 6 days. She has had a fever to 101F and was bedbound for one day. Although feeling better she still has a cough that is productive, has persistent fatigue and has episodes of mild diaphoresis (dampness). Tessalon perles help the cough. She does have GERD that is controlled with omeprazole.   Patient was diagnosed with contact dermatitis 2 weeks ago by Dr. Yetta Barre and was treated with oral and topical steroids, hydroxyzine was used for itching which does cause her somnolence. She continues to have a fading pruritic rash on the left side above the crest of the hip. She denies any vesicles or open lesions and denies any pain.   Past Medical History  Diagnosis Date  . Peripheral neuropathy   . Fibromyalgia   . Hyperlipemia   . Degenerative joint disease     cervical spine  . GERD (gastroesophageal reflux disease)   . Lumbar disc disease   . History of migraines   . Hypertension   . Depression   . Carotid artery disease     Doppler, April, 2012,  0-39% R. ICA, 60-79% LICA... no significant change... frequency of Doppler moved to yearly because of stability  . Chest pain     Nuclear stress test 2006 normal  . Hx of colonoscopy   . Drug therapy     msucle aches simva, but tolerates 5mg  Creastor   Past Surgical History  Procedure Date  . Appendectomy   . Laparoscopic ovarian cystectomy    Family History  Problem Relation Age of Onset  . Coronary artery disease Father   . Heart attack Father   . Diabetes Neg Hx   . Colon cancer Neg Hx   . Breast cancer Paternal Aunt    History   Social History  . Marital Status: Married    Spouse Name: N/A    Number of Children: 0  . Years of Education: N/A   Occupational History  .     Social History  Main Topics  . Smoking status: Former Smoker    Quit date: 07/11/1996  . Smokeless tobacco: Not on file  . Alcohol Use: Not on file  . Drug Use: No  . Sexually Active: Not on file   Other Topics Concern  . 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 HEENT:  Negative for hearing loss, ear pain, congestion, neck stiffness and postnasal drip.   Eyes: Negative for pain, discharge and visual disturbance.  Respiratory: Negative for chest tightness and wheezing. Positive for congestion and cough.   Cardiovascular: Negative for chest pain and palpitations.       [No decreased exercise tolerance Gastrointestinal: [No change in bowel habit. No bloating or gas. No reflux or indigestion Genitourinary: Negative for urgency, frequency, flank pain and difficulty urinating.  Musculoskeletal: Negative for myalgias, back pain, arthralgias and gait problem.  Neurological: Negative for dizziness, tremors, weakness and headaches.  Hematological: Negative for adenopathy.  Psychiatric/Behavioral: Negative for  behavioral problems and dysphoric mood.    Objective:   Physical Exam Vitals reviewed HEENT - no sinus tenderness. EACs, TMs normal, throat clear Neck - supple Chest - CTAP, good effort. Cor - RRR Derm - feint erythematous macular rash on left side just above crest of the pelvis. No excoriations or open lesions.       Assessment & Plan:  1. URI - patient with persistent symptoms   Plan - zpak as directed           Robitussin Dm and tessalon Perles  2. Rash seems to be resolving. Not zoster, no contact dermatitis. Possible Id reaction  Plan - for itching loratdine 10mg  and rantidine 1509mg  bid.   3. Peripheral  neuropathy - renewed gabpentin.

## 2010-11-26 NOTE — Op Note (Signed)
NAMEMARIKA, [name] NO.:  [id number]   MEDICAL RECORD NO.:  [id number]                   PATIENT TYPE:  AMB   LOCATION:  DSC                                  FACILITY:  [facility]   PHYSICIAN:  Erasmo Leventhal, M.D.         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   PROCEDURE:  Right shoulder manipulation under anesthesia, arthroscopic  assisted subacromial decompression, arthroscopic distal clavicle resection  and Mumford procedure, partial debridement of rotator cuff tear.   SURGEON:  Erasmo Leventhal, M.D.   ANESTHESIA:  Proparacaine and general block, Dr. Katrinka Blazing.   ESTIMATED BLOOD LOSS:  Less than 10 cc.   DRAINS:  None.   COMPLICATIONS:  None.   DISPOSITION:  PACU stable.   INDICATIONS FOR PROCEDURE:  The patient and family were counseled in the  holding area.   DESCRIPTION OF PROCEDURE:  The critical area was identified, IV started,  antibiotics, block was administered.  The patient was placed in the supine  position, general anesthesia, 1 g of Ancef was given. Right shoulder was  examined, initial range of motion was abduction 110, forward flexion 110,  external rotation 30 degrees.  After gentle manipulation under anesthesia,  she regained full range of motion. She was turned to the left lateral  decubitus position, properly padded on bump. Right shoulder was prepped and  draped in the usual sterile fashion, shoulder positioner utilized, 30  degrees of abduction and 10 degrees of forward  flexion, 10 pound block  placed on traction. Posterior portal was created and arthroscope placed in  the glenohumeral joint. Diagnostic arthroscopy revealed some bleeding from  the mid-base, placed under anesthesia, but the articular cartilage was  healthy, biceps stable, anchor was intact.  The labrum was examined  circumferentially and I did not need to do a rotator cuff interval release.  Rotator cuff was stable on the bursal surface, the articular surface was  found to have minimal fraying.  Irrigation, arthroscope was removed.  Arthroscope now placed in the glenohumeral joint with thick, subacromial  bursa was encountered.  An anterolateral portal was created and motorized  shaver introduced, and subacromial bursectomy was performed.  Rotator cuff  was inspected on the bursal surface and found to have about a 20% partial-  thickness tear at the supraspinatus insertion.  It was debrided with the  shaver, back to healthy tissue on the periphery, but did not destabilize it.  ArthroCare system was utilized to release the periosteum, CA ligament. Bur  was then placed posteriorly, and anteroinferior acromioplasty was performed,  converting to a type 1 acromion morphology.  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  An  accessory anterior portal was made just at the AC-joint.  Motorized shaver,  bur was introduced and the lateral 0.5 cm of clavicle was noted in the  subacromion circumferentially, leaving the anterior, posterior and superior  capsules intact.  Arthroscopic debris was removed and meticulous hemostasis  was performed. There were no other abnormalities noted and arthroscopic  equipment was removed.  I will also admit that part of the CA ligament had  been resected and hemostasis had been maintained. Taking out of traction,  normal pulse at the end of the case. Portal sites were closed with running  suture. After referring  to Dr. Katrinka Blazing, another 10 cc of 0.25% Marcaine with  epinephrine was placed in the portal sites subacromially.  Sterile dressing  was applied.  The patient was turned to supine, was extubated on the table  and returned to the room in stable condition in a sling. There were no  complications.  Sponge and needle counts were correct.                                               Erasmo Leventhal, M.D.    RAC/MEDQ  D:  06/10/2002  T:  06/10/2002  Job:  [id number]

## 2010-11-26 NOTE — H&P (Signed)
NAMEAMYRIA, Vasquez NO.:  [id number]   MEDICAL RECORD NO.:  [id number]          PATIENT TYPE:  INP   LOCATION:  4735                         FACILITY:  [facility]   PHYSICIAN:  Willa Rough, M.D.     DATE OF BIRTH:  Jul 06, 1946   DATE OF ADMISSION:  09/28/2004  DATE OF DISCHARGE:                                HISTORY & PHYSICAL   [name] is 65 years old.  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  She has no prior documented  coronary disease. She does have a family history of coronary disease.  The  patient is followed by Dr. Wyonia Hough.  In searching for office information  through the computer this evening from the hospital, I am not able to find  the records.  The patient has seen Dr. Debby Bud recently.  He ordered a CT of  the abdomen done September 27, 2004.  The result is pending.  This was done in  conjunction with Dr. Maryagnes Amos concerning right lower quadrant discomfort.   The patient has not had any symptoms recently.  Today while walking at her  place of work, she developed chest pain with radiation to the back.  She had  no nausea, vomiting, or diaphoresis.  However, she was quite concerned with  it and was taken to the [facility] Room.  Her enzymes there were  negative and EKG normal, and she was transported here for further  evaluation.   PAST MEDICAL HISTORY:   ALLERGIES:  No known drug allergies.   MEDICATIONS:  1.  Hydrochlorothiazide 25.  2.  Lipitor 20.  3.  Nexium 40.  4.  Lexapro 10.  5.  Neurontin 300.  6.  Mobic 15.   OTHER MEDICAL PROBLEMS:  See the complete list below.   SOCIAL HISTORY:  The patient is married and works as a Tree surgeon.  She does  not smoke.   FAMILY HISTORY:  The patient's father died of heart disease at age 9.   REVIEW OF SYSTEMS:  The patient has had some mild right lower quadrant  discomfort over the past weeks.  She has not had any other significant  symptoms, and her Review of Systems otherwise is negative.   PHYSICAL EXAMINATION:  VITAL SIGNS:  Blood pressure 135/70, pulse 70,  respirations 18, temperature 98.4.  LUNGS:  Clear.  NECK:  Normal.  HEENT:  Reveals no marked abnormalities.  CARDIAC:  S1, S2.  There are no clicks or significant murmurs.  ABDOMEN:  Benign.  EXTREMITIES:  She has no significant peripheral edema.  MUSCULOSKELETAL:  There are no major musculoskeletal deformities.   LABORATORY DATA AND OTHER STUDIES:  EKG is normal.   Chest x-ray is reported as normal from [facility].   Hemoglobin is normal.  Creatinine is normal.   PROBLEMS:  1.  History of peripheral neuropathy affecting the right foot.  2.  Gastroesophageal reflux disease.  3.  Elevated cholesterol.  4.  History of disk disease of the spine.  5.  Question of fibromyalgia.  The patient says that  she has been given this      diagnosis, but she is not convinced.  6.  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  7.  Right lower quadrant pain for which she had a CT scan of the abdomen on      March 20 at [facility] on [facility] [address] [facility].  8.  Family history of coronary disease.  *9.  Presentation today with new onset chest pain with radiation to the  back, now quite stable.   PLAN:  Check further cardiac enzymes.  The patient will have a CT of the  chest to rule out pulmonary embolus and rule out dissection.  If all of her  enzymes are negative and her chest CT is okay, we can plan for a followup  outpatient Myoview.  This may note be able to be done until September 29, 2004.   We will need to notify Dr. Debby Bud on March 22 and try to find the office  records.  I cannot understand why I cannot bring them up on the computer.  Also, with history of life screening showing some question of carotid  disease, she will need follow of carotids, and we will try to obtain the  results  of CT of the abdomen done on March 20.      JK/MEDQ  D:  09/28/2004  T:  09/28/2004  Job:  [id number]   cc:   Rosalyn Gess. Norins, M.D. [facility]   Gita Kudo, M.D.  [address], [address]  [place]  [place] [address]

## 2010-11-26 NOTE — Discharge Summary (Signed)
[name], [name] NO.:  [id number]   MEDICAL RECORD NO.:  [id number]          PATIENT TYPE:  INP   LOCATION:  4735                         FACILITY:  [facility]   PHYSICIAN:  Willa Rough, M.D.     DATE OF BIRTH:  1945-08-21   DATE OF ADMISSION:  DATE OF DISCHARGE:  09/29/2004                                 DISCHARGE SUMMARY   DISCHARGE DIAGNOSES:  Chest pain with negative cardiac enzymes x3.  Normal  EKG.  Status post CT of the chest showing noncalcified, 4 mm left lower lobe  posterior nodule, recommending follow-up in 3-6 months to document  stability.   PAST MEDICAL HISTORY:  1.  History of peripheral neuropathy affecting the right foot.  2.  GERD.  3.  Elevated cholesterol.  4.  History of disk disease of the spine.  5.  Question of fibromyalgia.  The patient states that she has been given      this diagnosis but she is not convinced.  6.  Mild to moderate left carotid disease, per the patient per a Life      Screening study done in [place].  7.  Right lower quadrant pain for which she had a CT of the abdomen on March      20, [facility] on the [employer].  8.  Family history of coronary artery disease.  9.  Depression.  10. Insomnia.  11. Hypertension.   PROCEDURES THIS ADMISSION:  CT scan of the chest, CT scan of the abdomen.  CT scan of the chest negative for pulmonary embolism.  Otherwise results as  stated above. CT scan of the abdomen showed no acute or significant  findings.   DISPOSITION:  The patient was discharged to home.   DISCHARGE INSTRUCTIONS:  Continue her previous medications to include:  1.  HCTZ 25 daily.  2.  Lipitor 20 daily.  3.  Nexium 40 mg daily.  4.  Lexapro 10 daily.  5.  Neurontin 300 at bedtime.  6.  Mobic 15 mg daily.   ACTIVITY:  As tolerated.   DIET:  As previously.   SPECIAL INSTRUCTIONS:  I have scheduled her for a stress test in our office  on Monday, March 27 at 11:45.  She is to  wear comfortable clothing. She is  instructed to be n.p.o. after midnight on Sunday for her stress test. I have  scheduled her follow-up appointment with Dr. Myrtis Ser for Friday, April 7 at 11  a.m. The patient will follow-up with Dr. Debby Bud as previously scheduled  concerning CT results with recommendations for repeat CT in 3-6 months. Also  have him follow-up on the mild to moderate left carotid disease per the  patient, per a Life Screening study done in [place].   HOSPITAL COURSE:  This is a 65 year old Caucasian female who was transferred  to [facility] from [facility] where she presented with  complaints of chest pain. 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  She  had no prior documented history  of coronary artery disease, however, she  does have some family history of CAD.   Initially, the patient was seen at [facility] with complaints of chest  pain with radiation to her back. Her enzymes there were negative, EKG was  also normal. She was transported here for further evaluation.  We cycled  cardiac enzymes, placed patient on heparin for anticoagulation therapy.  She  ruled out for MI, EKG without change.  D-dimer within normal range.  CT of  the chest negative for PE.  Also, patient's blood work, CBC, chemistries  within normal range.  She was afebrile, blood pressure 105/62, saturation  97% on room air.   The patient was discharged to home for follow-up as stated above.      MB/MEDQ  D:  09/29/2004  T:  09/29/2004  Job:  [id number]

## 2010-11-26 NOTE — Assessment & Plan Note (Signed)
[facility]                              CARDIOLOGY OFFICE NOTE   Catherine, Vasquez                    MRN:          [id number]  DATE:02/28/2006                            DOB:          [birth year]    [name] is here for cardiology follow-up.  She has had some mild chest  discomfort.  We know that in March 2006 she had a Myoview scan that was  normal.  She is not limited by her symptoms.  She has other medical problems  including some pain and her back and with some spinal stenosis that is being  assessed and treated.  She has not had any syncope or presyncope.  She does  have some carotid disease.  Follow-up Doppler in March 2007 showed 40-60%  left internal carotid lesion, and she needs follow-up in another year.   PAST MEDICAL HISTORY:  Allergies:  CODEINE.   Medications:  Hydrochlorothiazide, Prilosec, Lexapro and gabapentin.   Other medical problems:  See the complete list below.   REVIEW OF SYSTEMS:  The patient does have some chest discomfort.  She is  having some difficulty with the left ankle.  She is having trouble sleeping  (her mother passed away within the past 6 months).  Otherwise her review of  systems is negative.   PHYSICAL EXAMINATION:  VITAL SIGNS:  Blood pressure is 140/88 with a pulse  of 73.  GENERAL:  The patient is oriented to person, time and place, and her affect  is normal.  LUNGS:  Clear.  Respiratory effort is not labored.  HEENT:  No xanthelasma.  There is normal extraocular motion.  NECK:  There is no jugular venous distention.  CARDIAC:  An S1 with an S2.  There are no clicks or significant murmurs.  ABDOMEN:  Benign.  There are no masses or bruits in the abdomen.  There is  no peripheral edema.   EKG reveals no significant abnormality.   PROBLEM LIST:  1. History of peripheral neuropathy.  2. Gastroesophageal reflux disease.  3. Elevated cholesterol.  I will leave the approach to this to Dr.  Debby Bud.  4. History of disk disease and spinal stenosis.  5. Question of fibromyalgia.  6. Moderate carotid disease that is being followed.  7. History of coronary disease.  8. Some depression..  9. Insomnia.  10.Hypertension.   Cardiac status is stable.  I will see her back in cardiology follow-up as  needed.                               Luis Abed, MD, [facility]    JDK/MedQ  DD:  02/28/2006  DT:  03/01/2006  Job #:  [id number]   cc:   Rosalyn Gess. Norins, MD

## 2010-11-26 NOTE — Assessment & Plan Note (Signed)
[facility]                           PRIMARY CARE OFFICE NOTE   [name]                    MRN:          [id number]  DATE:06/26/2006                            DOB:          [birth year]    [name] is a 65 year old woman who presents for followup  evaluation and exam.  Her last full exam was August 30, 2005.  In the  interval the patient has been seen for scapular pain and sore throat.  Her evaluation has included nerve conduction studies performed Dec 02, 2005 which was an abnormal study showing denervation in the left upper  extremity suggestive of a C7 radiculopathy, thought to be mild.  Cervical MRI was performed which revealed spinal stenosis and C4-5 disk  bulge which was also thought to be mild.  She was seen in consultation  by Dr. Deneen Harts who felt she did have a mild radiculopathy.  She  was started on Neurontin, tapering up dose and given oxycodone p.r.n.  She was seen br Dr. Jonny Ruiz January 25, 2006 for followup.  Her medications  were continued.  She was diagnosed with restless leg syndrome and  started on Requip as well as being given Ambien 10 mg q.h.s. for sleep.  The patient did have a cardiology followup February 28, 2006 and was  thought to be cardiac stable.   The patient was last seen May 17, 2006 for foot pain and discomfort.  She was subsequently seen at [facility] by a PA who worked  for Dr. Lestine Box.  By her report she was diagnosed with a plantar  fasciitis, although she denied having any heel pain.  She reports her  pain was actually in all the small joints and ankle.  She was started on  prednisone which she reports has given her no relief.   The patient at that visit was also changed to simvastatin, although she  has not yet made this change.  She was also treated for GERD and changed  to omeprazole 40 mg q.a.m.  For her neuropathy and depression she was  changed to citalopram, but  has not yet made this change.  She was  continued on Amerge for her migraine and given a trial of temazepam for  her sleep.   PAST MEDICAL HISTORY:  Is well documented in Dr. Myrtis Ser' notes of February 28, 2006 and my note of August 30, 2005.    Family history, social history is likewise well documented.   HEALTH MAINTENANCE:  The patient's last mammogram was January of 2007  and was unremarkable.  The patient's last colonoscopy was April 25, 2001, was unremarkable with rectal bleeding source from internal  hemorrhoids.  The patient's last EGD was April 21, 2000 which showed  reflux esophagitis but no evidence of Barrett's esophagus.  Additional  health maintenance includes carotid Doppler study performed September 12, 2005 with a 40-59% ostial LPA.   CURRENT MEDICATIONS:  1. HCTZ 25 mg daily.  2. Citalopram 10 mg daily.  3. Neurontin 300 mg daily.  4. Simvastatin 40 mg daily.  5. Omeprazole 40  mg q.a.m.  6. Amerge 2.5 p.r.n.  7. Meloxicam 15 mg daily.  8. Cinolone gel on a p.r.n. basis.   REVIEW OF SYSTEMS:  The patient has had no fevers or chills.  She has  had a fluctuating weight, and reviewing her chart going from 179 to 162,  and back to 178 over a 3 year period.  She continues to have a problem  with insomnia.  Last I examined the last 12 months was normal.  No  dental problems, no cardiovascular complaints at this time with her last  evaluation by Dr. Myrtis Ser in August of 2007, last stress nuclear study in  2006, carotid Doppler is noted.  No respiratory or GI problems with  Prilosec 40 mg q.a.m. doing the trick.  No GU or musculoskeletal  complaints except as noted above.  NEURO:  Patient with a stable C7  radiculopathy which is mild, which does not compromise her activities of  daily living.   EXAMINATION:  Temperature was 98.4, blood pressure 132/74, pulse 77,  weight 178.  GENERAL APPEARANCE:  This is a well-nourished, well-groomed woman who  looks her stated age,  in no acute distress.  HEENT:  [place]/AT, EACs and TMs were unremarkable.  Oropharynx with native  dentition, no buccal or palatal lesions were noted, posterior pharynx  was clear, conjunctivae and sclerae were clear.  PERRLA/EOMI.  Funduscopic exam deferred to ophthalmology.  NECK:  Supple without thyromegaly.  NODES:  No adenopathy was noted in the cervical or supraclavicular  regions.  CHEST:  No CVA tenderness.  LUNGS:  Clear to auscultation and percussion.  BREAST:  Skin was normal, nipples without discharge.  No fixed mass,  lesion, or abnormality was noted, except for mild tenderness.  CARDIOVASCULAR:  With 2+ radial pulse, no JVD or carotid bruits.  She  had a quiet precordium with regular rate and rhythm without murmurs,  rub, or gallops.  ABDOMEN:  Soft, no guarding, no rebound.  No organosplenomegaly was  noted.  PELVIC:  NEGBUS was normal, vaginal mucosa appeared normal, patient's  cervix appeared nulliparous. Scraping endocervical brushing performed  without difficulty despite a mildly retroflexed uterus.  Bimanual exam  revealed a normal almond-sized ovary on the right, I was unable to  palpate an ovary on the left.  VAGINAL RECTAL:  Revealed normal posterior vaginal wall and no masses in  the rectal vault were noted.  Hemorrhoids were palpable.  EXTREMITIES:  Without clubbing, cyanosis, edema, deformity.  NEUROLOGIC:  Nonfocal.   DATABASE:  Hemoglobin 13.2 g, white count was 6100 with a normal  differential.  Chemistries with a serum glucose of 103.  Electrolytes  were normal, kidney function normal with a creatinine of 1.0.  Liver  functions were normal.  Thyroid function normal with a TSH of 1.71.  Cholesterol 158, triglycerides 63, HDL 59.4, LDL was 86.  Urinalysis was  negative.   ASSESSMENT/PLAN:  1. Orthopaedic.  Patient with an ongoing painful foot.  On examination     there is no obvious swelling and has good range of motion.  Given      the absence of heel  pain or discomfort with weight bearing I doubt      the diagnosis of plantar fasciitis.  The patient is getting no      relief from prednisone.  Plan:  Patient to return to the use of Mobic 15 mg daily.  She does have  an appointment scheduled to see Dr. Lestine Box in January.  1. Groin  pain.  Patient does give a complaint of pain in both groins,      which she thought was possibly gynecological in origin.  Her pelvic      exam was normal.  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  2. Hypertension.  The patient's blood pressure is adequately      controlled on her present medical regimen and she will continue the      same.  3. Gastroesophageal reflux disease.  The patient is well controlled      with omeprazole generic 40 mg q.a.m., and will continue the same.  4. Neurological.  Patient with a C7 radiculopathy by EMG and no spinal      stenosis by MRI.  She has been tolerating gabapentin 300 mg daily      with good results and tolerable symptoms.  This is augmented by her      citalopram and the meloxicam and she will continue this regimen.  5. Migraine.  Patient currently has stable migraines doing well with      Amerge on a p.r.n. basis.  She will continue with this regimen.  6. Insomnia.  Patient has recently lost her mother and since that time      she reports that she is unable to sleep.  She does not have any      specific symptoms, processing, or other indications of mental      activity that would be disturbing her sleep.  However, she is close      to tears when speaking of her mother. She does admit that she has      not shared her grief and tears with her family.  I discussed this      with her and I feel that grief reaction may be playing a      considerable role on her insomnia.  Plan:  Patient was prescribed Lunesta 2 mg to take q.h.s., I have  adamantly encouraged her to hosp[facility] of [place] to inquire  about grief  counseling which I think will help in regards to her overall welfare, as  well as her insomnia.  1. Health maintenance, patient with a normal mammogram in January of      2007 with repeat coming up January of 2008.  Pelvic examination      today was unremarkable, Pap smear was collected and is pending.      The patient is current and up to date with colorectal cancer      screening with followup study in 2012.  In summary is a very pleasant woman with medical problems outlined above  who does seem medically stable at this time.  She is asked to return to  see me on a p.r.n. basis.     Catherine Gess Norins, MD  Electronically Signed    MEN/MedQ  DD: 06/27/2006  DT: 06/27/2006  Job #: [id number]   cc:   Sanjuana Kava

## 2010-12-03 ENCOUNTER — Other Ambulatory Visit: Payer: Self-pay | Admitting: Internal Medicine

## 2011-02-09 ENCOUNTER — Other Ambulatory Visit: Payer: Self-pay | Admitting: Internal Medicine

## 2011-02-09 NOTE — Telephone Encounter (Signed)
Robaxin request [last refill 12/25/2009 #[id number]]

## 2011-02-11 ENCOUNTER — Other Ambulatory Visit: Payer: Self-pay | Admitting: Internal Medicine

## 2011-02-11 NOTE — Telephone Encounter (Signed)
Rx done per [facility] 02/09/11

## 2011-02-11 NOTE — Telephone Encounter (Signed)
Rx done per [facility] on 02/09/11

## 2011-02-11 NOTE — Telephone Encounter (Signed)
Ok x 5 

## 2011-02-15 ENCOUNTER — Other Ambulatory Visit: Payer: Self-pay | Admitting: Internal Medicine

## 2011-03-22 ENCOUNTER — Ambulatory Visit (INDEPENDENT_AMBULATORY_CARE_PROVIDER_SITE_OTHER): Payer: Medicare Other | Admitting: Internal Medicine

## 2011-03-22 ENCOUNTER — Other Ambulatory Visit (INDEPENDENT_AMBULATORY_CARE_PROVIDER_SITE_OTHER): Payer: Medicare Other

## 2011-03-22 ENCOUNTER — Encounter: Payer: Self-pay | Admitting: Internal Medicine

## 2011-03-22 DIAGNOSIS — IMO0001 Reserved for inherently not codable concepts without codable children: Secondary | ICD-10-CM

## 2011-03-22 DIAGNOSIS — Z0000 Encounter for general adult medical examination without abnormal findings: Secondary | ICD-10-CM

## 2011-03-22 DIAGNOSIS — K219 Gastro-esophageal reflux disease without esophagitis: Secondary | ICD-10-CM

## 2011-03-22 DIAGNOSIS — G609 Hereditary and idiopathic neuropathy, unspecified: Secondary | ICD-10-CM

## 2011-03-22 DIAGNOSIS — I10 Essential (primary) hypertension: Secondary | ICD-10-CM

## 2011-03-22 DIAGNOSIS — M19079 Primary osteoarthritis, unspecified ankle and foot: Secondary | ICD-10-CM

## 2011-03-22 DIAGNOSIS — E785 Hyperlipidemia, unspecified: Secondary | ICD-10-CM

## 2011-03-22 DIAGNOSIS — F329 Major depressive disorder, single episode, unspecified: Secondary | ICD-10-CM

## 2011-03-22 LAB — SEDIMENTATION RATE: Sed Rate: 10 mm/hr (ref 0–22)

## 2011-03-22 MED ORDER — TEMAZEPAM 15 MG PO CAPS: 15.0000 mg | ORAL_CAPSULE | Freq: Every evening | ORAL | Status: DC | PRN

## 2011-03-22 MED ORDER — ROSUVASTATIN CALCIUM 5 MG PO TABS: 5.0000 mg | ORAL_TABLET | Freq: Every day | ORAL | Status: DC

## 2011-03-22 MED ORDER — OMEPRAZOLE 40 MG PO CPDR: 40.0000 mg | DELAYED_RELEASE_CAPSULE | Freq: Every day | ORAL | Status: DC

## 2011-03-22 MED ORDER — FLUOCINONIDE 0.05 % EX GEL: CUTANEOUS | Status: DC | PRN

## 2011-03-22 MED ORDER — GABAPENTIN 300 MG PO CAPS: 300.0000 mg | ORAL_CAPSULE | Freq: Two times a day (BID) | ORAL | Status: DC

## 2011-03-22 MED ORDER — HYDROCHLOROTHIAZIDE 25 MG PO TABS: 25.0000 mg | ORAL_TABLET | Freq: Every day | ORAL | Status: DC

## 2011-03-22 MED ORDER — NARATRIPTAN HCL 1 MG PO TABS: 1.0000 mg | ORAL_TABLET | Freq: Once | ORAL | Status: DC | PRN

## 2011-03-22 MED ORDER — MELOXICAM 15 MG PO TABS: 15.0000 mg | ORAL_TABLET | Freq: Every day | ORAL | Status: DC

## 2011-03-22 MED ORDER — HYDROCODONE-ACETAMINOPHEN 10-325 MG PO TABS: 1.0000 | ORAL_TABLET | Freq: Four times a day (QID) | ORAL | Status: DC | PRN

## 2011-03-22 MED ORDER — CITALOPRAM HYDROBROMIDE 20 MG PO TABS: 20.0000 mg | ORAL_TABLET | Freq: Every day | ORAL | Status: DC

## 2011-03-22 NOTE — Progress Notes (Signed)
Subjective:    Patient ID: [name], female    DOB: 07-22-1945, 65 y.o.   MRN: [id number]  HPI  The patient is here for annual Medicare wellness examination and management of other chronic and acute problems. She is feeling well and has no new c/o.    The risk factors are reflected in the social history.  The roster of all physicians providing medical care to patient - is listed in the Snapshot section of the chart.  Activities of daily living:  The patient is 100% inedpendent in all ADLs: dressing, toileting, feeding as well as independent mobility  Home safety : The patient has smoke detectors in the home. They wear seatbelts.  Firearms are present in the home, kept in a safe fashion. There is no violence in the home.   There is no risks for hepatitis, STDs or HIV. There is no   history of blood transfusion. They have no travel history to infectious disease endemic areas of the world.  The patient has seen their dentist in the last six month. They have seen their eye doctor in the last year. They admit to hearing difficulty she states slightly more so in her left ear and have not had audiologic testing in the last year.  They do not  have excessive sun exposure. Discussed the need for sun protection: hats, long sleeves and use of sunscreen if there is significant sun exposure.   Diet: the importance of a healthy diet is discussed. They do have a less than healthy diet.  The patient does not have a regular exercise program.  The benefits of regular aerobic exercise were discussed.  Depression screen: there are no signs or vegative symptoms of depression- irritability, change in appetite, anhedonia, sadness/tearfullness.  Cognitive assessment: the patient manages all their financial and personal affairs and is actively engaged. They could relate day,date,year and events; recalled 3/3 objects at 3 minutes.  The following portions of the patient's history were reviewed and updated  as appropriate: allergies, current medications, past family history, past medical history,  past surgical history, past social history  and problem list.  Vision, hearing, body mass index were assessed and reviewed.   During the course of the visit the patient was educated and counseled about appropriate screening and preventive services including : fall prevention , diabetes screening, nutrition counseling, colorectal cancer screening, and recommended immunizations.  Past Medical History  Diagnosis Date  . Peripheral neuropathy   . Fibromyalgia   . Hyperlipemia   . Degenerative joint disease     cervical spine  . GERD (gastroesophageal reflux disease)   . Lumbar disc disease   . History of migraines   . Hypertension   . Depression   . Carotid artery disease     Doppler, April, 2012,  0-39% R. ICA, 60-79% LICA... no significant change... frequency of Doppler moved to yearly because of stability  . Chest pain     Nuclear stress test 2006 normal  . Hx of colonoscopy   . Drug therapy     msucle aches simva, but tolerates 5mg  Creastor   Past Surgical History  Procedure Date  . Appendectomy   . Laparoscopic ovarian cystectomy    Family History  Problem Relation Age of Onset  . Coronary artery disease Father   . Heart attack Father   . Heart disease Father     CAD/MI x 3, last fatal  . Diabetes Neg Hx   . Colon cancer Neg Hx   .  Breast cancer Paternal Aunt   . Cancer Paternal Aunt     breast cancer  . Stroke Mother   . Dementia Mother   . Heart disease Mother     chf  . Obesity Brother   . Hypertension Brother   . Cancer Paternal Aunt     breast cancer   History   Social History  . Marital Status: Married    Spouse Name: N/A    Number of Children: 0  . Years of Education: 13   Occupational History  .     Social History Main Topics  . Smoking status: Former Smoker    Quit date: 07/11/1996  . Smokeless tobacco: Never Used  . Alcohol Use: No  . Drug Use: No    . Sexually Active: Yes -- Female partner(s)   Other Topics Concern  . Not on file   Social History Narrative   HS[employer], [employer] school, AA degree [employer]. Married '64- 21 years/divorced; married '98. No children. 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        Review of Systems Review of Systems  Constitutional:  Negative for fever, chills, activity change and unexpected weight change.  HEENT:  Negative for hearing loss, ear pain, congestion, neck stiffness and postnasal drip. Negative for sore throat or swallowing problems. Negative for dental complaints.   Eyes: Negative for vision loss or change in visual acuity.  Respiratory: Negative for chest tightness and wheezing.   Cardiovascular: Negative for chest pain and palpitation. No decreased exercise tolerance Gastrointestinal: No change in bowel habit. No bloating or gas. No reflux or indigestion Genitourinary: Negative for urgency, frequency, flank pain and difficulty urinating.  Musculoskeletal: Negative for myalgias, back pain, arthralgias and gait problem.  Neurological: Negative for dizziness, tremors, weakness and headaches.  Hematological: Negative for adenopathy.  Psychiatric/Behavioral: Negative for behavioral problems and dysphoric mood.       Objective:   Physical Exam Vitals reviewed-stable Gen'l: well nourished, well developed white woman in no distress HEENT - [place]/AT, EACs/TMs normal, oropharynx with native dentition in good condition, no buccal or palatal lesions, posterior pharynx clear, mucous membranes moist. C&S clear, PERRLA, fundi - normal Neck - supple, no thyromegaly Nodes- negative submental, cervical, supraclavicular regions Chest - no deformity, no CVAT Lungs - cleat without rales, wheezes. No increased work of breathing Breast - deferred Cardiovascular - regular rate and rhythm, quiet precordium, no murmurs, rubs or gallops, 2+ radial, DP and PT  pulses Abdomen - BS+ x 4, no HSM, no guarding or rebound or tenderness Pelvic - deferred to gyn Rectal - deferred to gyn Extremities - no clubbing, cyanosis, edema or deformity.  Neuro - A&O x 3, CN II-XII normal, motor strength normal and equal, DTRs 2+ and symmetrical biceps, radial, and patellar tendons. Cerebellar - no tremor, no rigidity, fluid movement and normal gait. Derm - Head, neck, back, abdomen and extremities without suspicious lesions  Lab Results  Component Value Date   WBC 6.3 03/22/2011   HGB 12.9 03/22/2011   HCT 38.5 03/22/2011   PLT 235.0 03/22/2011   CHOL 191 03/22/2011   TRIG 136.0 03/22/2011   HDL 73.20 03/22/2011   ALT 18 03/22/2011   ALT 18 03/22/2011   AST 22 03/22/2011   AST 22 03/22/2011   NA 140 03/22/2011   K 4.6 03/22/2011   CL 102 03/22/2011   CREATININE 0.9 03/22/2011   BUN 15 03/22/2011   CO2 31  03/22/2011   TSH 1.16 12/18/2009        Glucose                 97 Lab Results  Component Value Date   LDLCALC 91 03/22/2011            Assessment & Plan:

## 2011-03-23 LAB — LIPID PANEL
Cholesterol: 191 mg/dL (ref 0–200)
HDL: 73.2 mg/dL (ref >39.00)
LDL Cholesterol: 91 mg/dL (ref 0–99)
Total CHOL/HDL Ratio: 3
Triglycerides: 136 mg/dL (ref 0.0–149.0)
VLDL: 27.2 mg/dL (ref 0.0–40.0)

## 2011-03-23 LAB — CBC WITH DIFFERENTIAL/PLATELET
Basophils Absolute: 0 10*3/uL (ref 0.0–0.1)
Basophils Relative: 0.5 % (ref 0.0–3.0)
Eosinophils Absolute: 0.2 10*3/uL (ref 0.0–0.7)
Eosinophils Relative: 2.8 % (ref 0.0–5.0)
HCT: 38.5 % (ref 36.0–46.0)
Hemoglobin: 12.9 g/dL (ref 12.0–15.0)
Lymphocytes Relative: 24.3 % (ref 12.0–46.0)
Lymphs Abs: 1.5 10*3/uL (ref 0.7–4.0)
MCHC: 33.5 g/dL (ref 30.0–36.0)
MCV: 91 fl (ref 78.0–100.0)
Monocytes Absolute: 0.4 10*3/uL (ref 0.1–1.0)
Monocytes Relative: 5.7 % (ref 3.0–12.0)
Neutro Abs: 4.2 10*3/uL (ref 1.4–7.7)
Neutrophils Relative %: 66.7 % (ref 43.0–77.0)
Platelets: 235 10*3/uL (ref 150.0–400.0)
RBC: 4.23 Mil/uL (ref 3.87–5.11)
RDW: 13.6 % (ref 11.5–14.6)
WBC: 6.3 10*3/uL (ref 4.5–10.5)

## 2011-03-23 LAB — HEPATIC FUNCTION PANEL
ALT: 18 U/L (ref 0–35)
AST: 22 U/L (ref 0–37)
Albumin: 4 g/dL (ref 3.5–5.2)
Alkaline Phosphatase: 65 U/L (ref 39–117)
Bilirubin, Direct: 0.1 mg/dL (ref 0.0–0.3)
Total Bilirubin: 0.3 mg/dL (ref 0.3–1.2)
Total Protein: 6.5 g/dL (ref 6.0–8.3)

## 2011-03-23 LAB — COMPREHENSIVE METABOLIC PANEL
ALT: 18 U/L (ref 0–35)
AST: 22 U/L (ref 0–37)
Albumin: 4 g/dL (ref 3.5–5.2)
Alkaline Phosphatase: 65 U/L (ref 39–117)
BUN: 15 mg/dL (ref 6–23)
CO2: 31 mEq/L (ref 19–32)
Calcium: 9.2 mg/dL (ref 8.4–10.5)
Chloride: 102 mEq/L (ref 96–112)
Creatinine, Ser: 0.9 mg/dL (ref 0.4–1.2)
GFR: 65.72 mL/min (ref >60.00)
Glucose, Bld: 97 mg/dL (ref 70–99)
Potassium: 4.6 mEq/L (ref 3.5–5.1)
Sodium: 140 mEq/L (ref 135–145)
Total Bilirubin: 0.3 mg/dL (ref 0.3–1.2)
Total Protein: 6.5 g/dL (ref 6.0–8.3)

## 2011-03-26 DIAGNOSIS — Z0000 Encounter for general adult medical examination without abnormal findings: Secondary | ICD-10-CM | POA: Insufficient documentation

## 2011-03-26 NOTE — Assessment & Plan Note (Signed)
Good control on present medications with LDL below goal of 100 or less.  Plan - continue present medications

## 2011-03-26 NOTE — Assessment & Plan Note (Signed)
Stable problem with pain but no progression of symptoms. No change in medication proposed.

## 2011-03-26 NOTE — Assessment & Plan Note (Signed)
Adequate control with PPI therapy - will continue the same.

## 2011-03-26 NOTE — Assessment & Plan Note (Signed)
Interval history is w/ change in condition. Physical exam, sans pelvic and breast, is notable for weight, foot tenderness, diffuse muscle tenderness. She is current with mammography, colonoscopy and immunizations. She is current with her cardiologist.  In summary - a nice woman with multiple medical problems who does appear stable at this time. She will return as needed or in 6 months for mid-year follow-up.

## 2011-03-26 NOTE — Assessment & Plan Note (Signed)
Stable on present medications - but with a constant level of underlying pain

## 2011-03-26 NOTE — Assessment & Plan Note (Signed)
A continuing problem but ameliorated by gabapentin so that it is tolerable.  Plan - continue present dose of medication.

## 2011-03-26 NOTE — Assessment & Plan Note (Signed)
BP Readings from Last 3 Encounters:  03/22/11 142/76  11/22/10 120/68  11/22/10 152/72   Borderline control on present medications.  Plan - no change in medication at this time           Continue life-style mgt with some form of exercise (in water is probably most tolerable), weight loss           Monitor BP as an outpatient to insure majority of the time SBP is 140 or less.

## 2011-03-26 NOTE — Assessment & Plan Note (Signed)
She has a positive affect at today's exam. She continues on citalopram with no adverse effects.  Plan - no change in regimen

## 2011-04-11 ENCOUNTER — Telehealth: Payer: Self-pay | Admitting: *Deleted

## 2011-04-11 DIAGNOSIS — G609 Hereditary and idiopathic neuropathy, unspecified: Secondary | ICD-10-CM

## 2011-04-11 NOTE — Telephone Encounter (Signed)
Spoke w/pt - she has apt October 22nd - Dr Mhoon is a neurologist and apt is for eval of neuropathy in her feet.

## 2011-04-11 NOTE — Telephone Encounter (Signed)
Glad to help. What is the problem she is going to have evaluated and what specialty is the doctor. With that information we can make a referral.

## 2011-04-11 NOTE — Telephone Encounter (Signed)
Patient requesting referral to [name] at [place], [place] # [phone]. She already made herself an apt and needs our office to send referral.

## 2011-04-12 NOTE — Telephone Encounter (Signed)
Request placed with [facility] - let them know she has an appointment for Oct 22nd

## 2011-04-26 ENCOUNTER — Other Ambulatory Visit: Payer: Self-pay | Admitting: *Deleted

## 2011-04-26 MED ORDER — GABAPENTIN 300 MG PO CAPS: 300.0000 mg | ORAL_CAPSULE | Freq: Two times a day (BID) | ORAL | Status: DC

## 2011-04-29 ENCOUNTER — Other Ambulatory Visit: Payer: Self-pay

## 2011-04-29 MED ORDER — GABAPENTIN 300 MG PO CAPS: 300.0000 mg | ORAL_CAPSULE | Freq: Two times a day (BID) | ORAL | Status: DC

## 2011-04-29 NOTE — Telephone Encounter (Signed)
Pt called stating Rx for Gabapentin was sent to local pharmacy rather than mail order as requested. Rx re-sent.

## 2011-05-03 ENCOUNTER — Encounter: Payer: Self-pay | Admitting: Internal Medicine

## 2011-06-14 ENCOUNTER — Other Ambulatory Visit: Payer: Self-pay | Admitting: Internal Medicine

## 2011-07-07 ENCOUNTER — Encounter (HOSPITAL_COMMUNITY): Payer: Self-pay | Admitting: Emergency Medicine

## 2011-07-07 ENCOUNTER — Emergency Department (HOSPITAL_COMMUNITY)
Admission: EM | Admit: 2011-07-07 | Discharge: 2011-07-07 | Disposition: A | Discharge status: Home / Self Care | Payer: Medicare Other | Attending: Emergency Medicine | Admitting: Emergency Medicine

## 2011-07-07 DIAGNOSIS — I2510 Atherosclerotic heart disease of native coronary artery without angina pectoris: Secondary | ICD-10-CM | POA: Insufficient documentation

## 2011-07-07 DIAGNOSIS — R51 Headache: Secondary | ICD-10-CM

## 2011-07-07 DIAGNOSIS — K2970 Gastritis, unspecified, without bleeding: Secondary | ICD-10-CM | POA: Insufficient documentation

## 2011-07-07 DIAGNOSIS — I10 Essential (primary) hypertension: Secondary | ICD-10-CM | POA: Insufficient documentation

## 2011-07-07 DIAGNOSIS — E876 Hypokalemia: Secondary | ICD-10-CM | POA: Insufficient documentation

## 2011-07-07 DIAGNOSIS — R000 Tachycardia, unspecified: Secondary | ICD-10-CM | POA: Insufficient documentation

## 2011-07-07 LAB — DIFFERENTIAL
Basophils Absolute: 0 10*3/uL (ref 0.0–0.1)
Basophils Relative: 0 % (ref 0–1)
Eosinophils Absolute: 0 10*3/uL (ref 0.0–0.7)
Eosinophils Relative: 0 % (ref 0–5)
Lymphocytes Relative: 5 % — ABNORMAL LOW (ref 12–46)
Lymphs Abs: 0.4 10*3/uL — ABNORMAL LOW (ref 0.7–4.0)
Monocytes Absolute: 0.4 10*3/uL (ref 0.1–1.0)
Monocytes Relative: 4 % (ref 3–12)
Neutro Abs: 8.6 10*3/uL — ABNORMAL HIGH (ref 1.7–7.7)
Neutrophils Relative %: 91 % — ABNORMAL HIGH (ref 43–77)

## 2011-07-07 LAB — BASIC METABOLIC PANEL
BUN: 20 mg/dL (ref 6–23)
CO2: 28 mEq/L (ref 19–32)
Calcium: 9.8 mg/dL (ref 8.4–10.5)
Chloride: 100 mEq/L (ref 96–112)
Creatinine, Ser: 0.77 mg/dL (ref 0.50–1.10)
GFR calc Af Amer: >90 mL/min (ref >90)
GFR calc non Af Amer: 86 mL/min — ABNORMAL LOW (ref >90)
Glucose, Bld: 117 mg/dL — ABNORMAL HIGH (ref 70–99)
Potassium: 3.1 mEq/L — ABNORMAL LOW (ref 3.5–5.1)
Sodium: 137 mEq/L (ref 135–145)

## 2011-07-07 LAB — CBC
HCT: 40.7 % (ref 36.0–46.0)
Hemoglobin: 14 g/dL (ref 12.0–15.0)
MCH: 30.1 pg (ref 26.0–34.0)
MCHC: 34.4 g/dL (ref 30.0–36.0)
MCV: 87.5 fL (ref 78.0–100.0)
Platelets: 239 10*3/uL (ref 150–400)
RBC: 4.65 MIL/uL (ref 3.87–5.11)
RDW: 13.1 % (ref 11.5–15.5)
WBC: 9.4 10*3/uL (ref 4.0–10.5)

## 2011-07-07 MED ORDER — SODIUM CHLORIDE 0.9 % IV BOLUS (SEPSIS)
1000.0000 mL | Freq: Once | INTRAVENOUS | Status: AC
  Administered 2011-07-07: 1000 mL via INTRAVENOUS

## 2011-07-07 MED ORDER — METOCLOPRAMIDE HCL 5 MG/ML IJ SOLN
10.0000 mg | Freq: Once | INTRAMUSCULAR | Status: AC
  Administered 2011-07-07: 10 mg via INTRAVENOUS
  Filled 2011-07-07: qty 2

## 2011-07-07 MED ORDER — PROMETHAZINE HCL 25 MG PO TABS: 25.0000 mg | ORAL_TABLET | Freq: Four times a day (QID) | ORAL | Status: DC | PRN

## 2011-07-07 MED ORDER — DIPHENHYDRAMINE HCL 50 MG/ML IJ SOLN
25.0000 mg | Freq: Once | INTRAMUSCULAR | Status: AC
  Administered 2011-07-07: 25 mg via INTRAVENOUS
  Filled 2011-07-07: qty 1

## 2011-07-07 MED ORDER — SODIUM CHLORIDE 0.9 % IV SOLN
Freq: Once | INTRAVENOUS | Status: AC
  Administered 2011-07-07: 06:00:00 via INTRAVENOUS

## 2011-07-07 MED ORDER — POTASSIUM CHLORIDE ER 10 MEQ PO TBCR: 20.0000 meq | EXTENDED_RELEASE_TABLET | Freq: Two times a day (BID) | ORAL | Status: DC

## 2011-07-07 MED ORDER — HYDROMORPHONE HCL PF 1 MG/ML IJ SOLN
1.0000 mg | Freq: Once | INTRAMUSCULAR | Status: DC
  Filled 2011-07-07: qty 1

## 2011-07-07 NOTE — ED Provider Notes (Signed)
History     CSN: [id number]  Arrival date & time 07/07/11  7829   First MD Initiated Contact with Patient 07/07/11 [phone]      Chief Complaint  Patient presents with  . Nausea  . Emesis    (Consider location/radiation/quality/duration/timing/severity/associated sxs/prior treatment) Patient is a 65 y.o. female presenting with vomiting. The history is provided by the patient.  Emesis   She started vomiting yesterday and vomited numerous times. She now is not raising anything, just having dry heaves. There is abdominal cramping which was severe last night but is improved somewhat. Pain was 10 out of 10 last night, but is down to 5/10. She denies diarrhea. There's been no fever, chills, sweats. She developed a headache this evening which is global and described as a dull achy pain which is moderate in intensity. The vomiting was severe. She has had a sick contact in that she was around children who had recently had a stomach virus.  Past Medical History  Diagnosis Date  . Peripheral neuropathy   . Fibromyalgia   . Hyperlipemia   . Degenerative joint disease     cervical spine  . GERD (gastroesophageal reflux disease)   . Lumbar disc disease   . History of migraines   . Hypertension   . Depression   . Carotid artery disease     Doppler, April, 2012,  0-39% R. ICA, 60-79% LICA... no significant change... frequency of Doppler moved to yearly because of stability  . Chest pain     Nuclear stress test 2006 normal  . Hx of colonoscopy   . Drug therapy     msucle aches simva, but tolerates 5mg  Creastor    Past Surgical History  Procedure Date  . Appendectomy   . Laparoscopic ovarian cystectomy     Family History  Problem Relation Age of Onset  . Coronary artery disease Father   . Heart attack Father   . Heart disease Father     CAD/MI x 3, last fatal  . Diabetes Neg Hx   . Colon cancer Neg Hx   . Breast cancer Paternal Aunt   . Cancer Paternal Aunt     breast cancer  .  Stroke Mother   . Dementia Mother   . Heart disease Mother     chf  . Obesity Brother   . Hypertension Brother   . Cancer Paternal Aunt     breast cancer    History  Substance Use Topics  . Smoking status: Former Smoker    Quit date: 07/11/1996  . Smokeless tobacco: Never Used  . Alcohol Use: No    OB History    Grav Para Term Preterm Abortions TAB SAB Ect Mult Living                  Review of Systems  Gastrointestinal: Positive for vomiting.  All other systems reviewed and are negative.    Allergies  Celebrex and Codeine  Home Medications   Current Outpatient Rx  Name Route Sig Dispense Refill  . ASPIRIN 81 MG PO TABS Oral Take 81 mg by mouth daily.      Marland Kitchen BENZONATATE 100 MG PO CAPS Oral Take 1 capsule (100 mg total) by mouth 3 (three) times daily as needed. 30 capsule 2  . CASANTHRANOL-DOCUSATE SODIUM 30-100 MG PO CAPS Oral Take by mouth.      Marland Kitchen CITALOPRAM HYDROBROMIDE 20 MG PO TABS Oral Take 1 tablet (20 mg total) by mouth daily. 90  tablet 3  . CITALOPRAM HYDROBROMIDE 20 MG PO TABS  TAKE 1 TABLET EVERY DAY 90 tablet 0  . CLOBETASOL PROPIONATE 0.05 % EX OINT Topical Apply topically 2 (two) times daily. 60 g 0  . FLUOCINONIDE 0.05 % EX GEL Topical Apply topically as needed. Use  Bid as needed for oral ulcers 15 g 2  . FLUTICASONE PROPIONATE 50 MCG/ACT NA SUSP Nasal 2 sprays by Nasal route daily.      Marland Kitchen FA-VITAMIN B-6-VITAMIN B-12 2.2-25-0.5 MG PO TABS Oral Take by mouth daily.      Marland Kitchen GABAPENTIN 300 MG PO CAPS Oral Take 1 capsule (300 mg total) by mouth 2 (two) times daily. 180 capsule 3  . HYDROCHLOROTHIAZIDE 25 MG PO TABS Oral Take 1 tablet (25 mg total) by mouth daily. 90 tablet 3  . HYDROCODONE-ACETAMINOPHEN 10-325 MG PO TABS Oral Take 1 tablet by mouth every 6 (six) hours as needed. 60 tablet 3  . HYDROCORTISONE ACETATE 25 MG RE SUPP Rectal Place 25 mg rectally 2 (two) times daily.      . MELOXICAM 15 MG PO TABS Oral Take 1 tablet (15 mg total) by mouth daily.  90 tablet 3  . METHOCARBAMOL 500 MG PO TABS  ONE BY MOUTH THREE TIMES A DAY AS NEEDED BACK SPASM 35 tablet 5  . NARATRIPTAN HCL 1 MG PO TABS Oral Take 1 tablet (1 mg total) by mouth once as needed. Take one (1) tablet at onset of headache; if returns or does not resolve, may repeat after 4 hours; do not exceed five (5) mg in 24 hours. 36 tablet 3  . NARATRIPTAN HCL 2.5 MG PO TABS Oral Take 1 tablet (2.5 mg total) by mouth as needed for migraine. Take one (1) tablet at onset of headache; if returns or does not resolve, may repeat after 4 hours; do not exceed five (5) mg in 24 hours. 10 tablet 0  . OMEPRAZOLE 40 MG PO CPDR Oral Take 1 capsule (40 mg total) by mouth daily. 90 capsule 3  . ROSUVASTATIN CALCIUM 5 MG PO TABS Oral Take 1 tablet (5 mg total) by mouth daily. 3 times weekly 45 tablet 3  . TEMAZEPAM 15 MG PO CAPS Oral Take 1 capsule (15 mg total) by mouth at bedtime as needed. 90 capsule 3  . TEMAZEPAM 15 MG PO CAPS Oral Take 1 capsule (15 mg total) by mouth at bedtime as needed for sleep. 30 capsule 5    BP 158/74  Pulse 103  Temp(Src) 98 F (36.7 C) (Oral)  Resp 16  Wt 169 lb (76.658 kg)  SpO2 98%  Physical Exam  Nursing note and vitals reviewed.  65 year old female who appears uncomfortable. Vital signs significant for tachycardia with heart rate of 103 and mild hypertension with blood pressure 150/74. Oxygen saturation is 98% which is normal. Head is normocephalic and atraumatic. PERRLA, EOMI. Mucous members are slightly dry. Neck is supple without adenopathy. Lungs are clear without rales, wheezes, rhonchi. Heart has regular rate and rhythm without murmur. Abdomen is soft, flat, nontender with no masses or hepatosplenomegaly. Peristalsis is diminished. Extremities have no cyanosis or edema. Skin is warm and moist without rash. Neurologic: Mental status is normal, cranial nerves are intact, there no focal motor or sensory deficits. Psychiatric: No abnormalities of mood or affect.  ED  Course  Procedures (including critical care time)   Labs Reviewed  BASIC METABOLIC PANEL  CBC  DIFFERENTIAL   No results found. Results for orders placed during  the hospital encounter of 07/07/11  BASIC METABOLIC PANEL      Component Value Range   Sodium 137  135 - 145 (mEq/L)   Potassium 3.1 (*) 3.5 - 5.1 (mEq/L)   Chloride 100  96 - 112 (mEq/L)   CO2 28  19 - 32 (mEq/L)   Glucose, Bld 117 (*) 70 - 99 (mg/dL)   BUN 20  6 - 23 (mg/dL)   Creatinine, Ser 4.54  0.50 - 1.10 (mg/dL)   Calcium 9.8  8.4 - 09.8 (mg/dL)   GFR calc non Af Amer 86 (*) >90 (mL/min)   GFR calc Af Amer >90  >90 (mL/min)  CBC      Component Value Range   WBC 9.4  4.0 - 10.5 (K/uL)   RBC 4.65  3.87 - 5.11 (MIL/uL)   Hemoglobin 14.0  12.0 - 15.0 (g/dL)   HCT 11.9  14.7 - 82.9 (%)   MCV 87.5  78.0 - 100.0 (fL)   MCH 30.1  26.0 - 34.0 (pg)   MCHC 34.4  30.0 - 36.0 (g/dL)   RDW 56.2  13.0 - 86.5 (%)   Platelets 239  150 - 400 (K/uL)  DIFFERENTIAL      Component Value Range   Neutrophils Relative 91 (*) 43 - 77 (%)   Neutro Abs 8.6 (*) 1.7 - 7.7 (K/uL)   Lymphocytes Relative 5 (*) 12 - 46 (%)   Lymphs Abs 0.4 (*) 0.7 - 4.0 (K/uL)   Monocytes Relative 4  3 - 12 (%)   Monocytes Absolute 0.4  0.1 - 1.0 (K/uL)   Eosinophils Relative 0  0 - 5 (%)   Eosinophils Absolute 0.0  0.0 - 0.7 (K/uL)   Basophils Relative 0  0 - 1 (%)   Basophils Absolute 0.0  0.0 - 0.1 (K/uL)   No results found.    No diagnosis found.  7846: After IV fluids and IV Reglan and IV Benadryl, nausea is improved but headache persists.   0730: She was offered IV Dilaudid but refused. After an additional hour of IV hydration, her headache is much improved and her nausea continues to do well. She is ready to go home at this point.  MDM  Persistent vomiting which seems most consistent with a viral gastritis. No evidence of any more serious GI pathology at this point. Headache is probably due to dehydration. No evidence of serious  pathology such as meningitis or aneurysm. She will be treated with IV fluids and IV metoclopramide and reevaluated.        Dione Booze, MD 07/07/11 [phone]

## 2011-07-07 NOTE — ED Notes (Signed)
Pt declined dilaudid at this time.  Pt states that she doesn't want to throw up. Pt denies nausea at this time.

## 2011-07-07 NOTE — ED Notes (Signed)
Pt alert, nad, c/o nausea with emesis, onset yesterday, resp even unlabored, skin pwd, pt denies recent ill contacts, denies changes in bowel or bladder, mmm

## 2011-07-13 ENCOUNTER — Telehealth: Payer: Self-pay | Admitting: *Deleted

## 2011-07-13 NOTE — Telephone Encounter (Signed)
Message copied by Rosalio Macadamia, Adryel Wortmann B on Wed Jul 13, 2011  2:12 PM ------      Message from: COUSIN, SHARON T      Created: Wed Jul 13, 2011  9:06 AM      Regarding: PT DESIRE TO BE SEEN TODAY--NO SLOTS       Matisyn Cabeza,  [facility] Il[facility] is requesting today appt--she has body aches fever cough and congestion nausea.  She said this started last Wednesday.  Do you all want her to be worked in today.  Thank you so much for your reply.

## 2011-07-14 ENCOUNTER — Observation Stay (HOSPITAL_COMMUNITY): Payer: Medicare Other

## 2011-07-14 ENCOUNTER — Encounter: Payer: Self-pay | Admitting: Internal Medicine

## 2011-07-14 ENCOUNTER — Observation Stay (HOSPITAL_COMMUNITY)
Admission: AD | Admit: 2011-07-14 | Discharge: 2011-07-16 | Disposition: A | Discharge status: Home / Self Care | Payer: Medicare Other | Source: Ambulatory Visit | Attending: Internal Medicine | Admitting: Internal Medicine

## 2011-07-14 ENCOUNTER — Encounter (HOSPITAL_COMMUNITY): Payer: Self-pay | Admitting: General Practice

## 2011-07-14 ENCOUNTER — Emergency Department (HOSPITAL_COMMUNITY): Admission: EM | Admit: 2011-07-14 | Payer: Medicare Other | Source: Home / Self Care

## 2011-07-14 ENCOUNTER — Ambulatory Visit (HOSPITAL_BASED_OUTPATIENT_CLINIC_OR_DEPARTMENT_OTHER): Payer: Medicare Other | Admitting: Internal Medicine

## 2011-07-14 VITALS — BP 86/62 | HR 81 | Temp 98.2°F | Resp 14 | Wt 165.4 lb

## 2011-07-14 DIAGNOSIS — R404 Transient alteration of awareness: Secondary | ICD-10-CM | POA: Diagnosis not present

## 2011-07-14 DIAGNOSIS — A088 Other specified intestinal infections: Secondary | ICD-10-CM | POA: Diagnosis not present

## 2011-07-14 DIAGNOSIS — E860 Dehydration: Secondary | ICD-10-CM | POA: Diagnosis not present

## 2011-07-14 DIAGNOSIS — R05 Cough: Secondary | ICD-10-CM | POA: Diagnosis not present

## 2011-07-14 DIAGNOSIS — J111 Influenza due to unidentified influenza virus with other respiratory manifestations: Secondary | ICD-10-CM | POA: Diagnosis not present

## 2011-07-14 DIAGNOSIS — I951 Orthostatic hypotension: Secondary | ICD-10-CM | POA: Diagnosis not present

## 2011-07-14 DIAGNOSIS — R5381 Other malaise: Secondary | ICD-10-CM | POA: Diagnosis not present

## 2011-07-14 DIAGNOSIS — R059 Cough, unspecified: Secondary | ICD-10-CM | POA: Diagnosis not present

## 2011-07-14 HISTORY — DX: Polyneuropathy, unspecified: G62.9

## 2011-07-14 HISTORY — DX: Migraine, unspecified, not intractable, without status migrainosus: G43.909

## 2011-07-14 LAB — COMPREHENSIVE METABOLIC PANEL
ALT: 19 U/L (ref 0–35)
AST: 26 U/L (ref 0–37)
Albumin: 3.3 g/dL — ABNORMAL LOW (ref 3.5–5.2)
Alkaline Phosphatase: 61 U/L (ref 39–117)
BUN: 16 mg/dL (ref 6–23)
CO2: 26 mEq/L (ref 19–32)
Calcium: 9 mg/dL (ref 8.4–10.5)
Chloride: 101 mEq/L (ref 96–112)
Creatinine, Ser: 0.9 mg/dL (ref 0.50–1.10)
GFR calc Af Amer: 76 mL/min — ABNORMAL LOW (ref >90)
GFR calc non Af Amer: 65 mL/min — ABNORMAL LOW (ref >90)
Glucose, Bld: 91 mg/dL (ref 70–99)
Potassium: 4.4 mEq/L (ref 3.5–5.1)
Sodium: 138 mEq/L (ref 135–145)
Total Bilirubin: 0.3 mg/dL (ref 0.3–1.2)
Total Protein: 6.6 g/dL (ref 6.0–8.3)

## 2011-07-14 LAB — DIFFERENTIAL
Basophils Absolute: 0 10*3/uL (ref 0.0–0.1)
Basophils Relative: 0 % (ref 0–1)
Eosinophils Absolute: 0.1 10*3/uL (ref 0.0–0.7)
Eosinophils Relative: 1 % (ref 0–5)
Lymphocytes Relative: 35 % (ref 12–46)
Lymphs Abs: 1.8 10*3/uL (ref 0.7–4.0)
Monocytes Absolute: 0.6 10*3/uL (ref 0.1–1.0)
Monocytes Relative: 12 % (ref 3–12)
Neutro Abs: 2.6 10*3/uL (ref 1.7–7.7)
Neutrophils Relative %: 52 % (ref 43–77)

## 2011-07-14 LAB — URINALYSIS, ROUTINE W REFLEX MICROSCOPIC
Bilirubin Urine: NEGATIVE
Glucose, UA: NEGATIVE mg/dL
Hgb urine dipstick: NEGATIVE
Ketones, ur: 15 mg/dL — AB
Leukocytes, UA: NEGATIVE
Nitrite: NEGATIVE
Protein, ur: NEGATIVE mg/dL
Specific Gravity, Urine: 1.009 (ref 1.005–1.030)
Urobilinogen, UA: 0.2 mg/dL (ref 0.0–1.0)
pH: 7 (ref 5.0–8.0)

## 2011-07-14 LAB — CBC
HCT: 41.3 % (ref 36.0–46.0)
Hemoglobin: 13.9 g/dL (ref 12.0–15.0)
MCH: 29.6 pg (ref 26.0–34.0)
MCHC: 33.7 g/dL (ref 30.0–36.0)
MCV: 87.9 fL (ref 78.0–100.0)
Platelets: 267 10*3/uL (ref 150–400)
RBC: 4.7 MIL/uL (ref 3.87–5.11)
RDW: 13 % (ref 11.5–15.5)
WBC: 5.1 10*3/uL (ref 4.0–10.5)

## 2011-07-14 MED ORDER — SODIUM CHLORIDE 0.9 % IV SOLN: 40.0000 mg | INTRAVENOUS | Status: DC

## 2011-07-14 MED ORDER — PNEUMOCOCCAL VAC POLYVALENT 25 MCG/0.5ML IJ INJ
0.5000 mL | INJECTION | INTRAMUSCULAR | Status: AC
  Filled 2011-07-14: qty 0.5

## 2011-07-14 MED ORDER — DOCUSATE SODIUM 100 MG PO CAPS
100.0000 mg | ORAL_CAPSULE | Freq: Two times a day (BID) | ORAL | Status: DC
  Administered 2011-07-14 – 2011-07-16 (×4): 100 mg via ORAL
  Filled 2011-07-14 (×4): qty 1

## 2011-07-14 MED ORDER — ENOXAPARIN SODIUM 30 MG/0.3ML ~~LOC~~ SOLN: 40.0000 mg | SUBCUTANEOUS | Status: DC

## 2011-07-14 MED ORDER — WHITE PETROLATUM GEL
Status: AC
  Administered 2011-07-14: 19:00:00
  Filled 2011-07-14: qty 5

## 2011-07-14 MED ORDER — ROSUVASTATIN CALCIUM 5 MG PO TABS
5.0000 mg | ORAL_TABLET | ORAL | Status: DC
  Administered 2011-07-15: 5 mg via ORAL
  Filled 2011-07-14: qty 1

## 2011-07-14 MED ORDER — SODIUM CHLORIDE 0.9 % IV SOLN: 4.0000 mg | Freq: Four times a day (QID) | INTRAVENOUS | Status: DC | PRN

## 2011-07-14 MED ORDER — GABAPENTIN 600 MG PO TABS
300.0000 mg | ORAL_TABLET | Freq: Two times a day (BID) | ORAL | Status: DC
  Filled 2011-07-14: qty 0.5

## 2011-07-14 MED ORDER — ENOXAPARIN SODIUM 40 MG/0.4ML ~~LOC~~ SOLN: 40.0000 mg | SUBCUTANEOUS | Status: DC

## 2011-07-14 MED ORDER — MOXIFLOXACIN HCL IN NACL 400 MG/250ML IV SOLN
400.0000 mg | INTRAVENOUS | Status: DC
  Administered 2011-07-14 – 2011-07-15 (×2): 400 mg via INTRAVENOUS
  Filled 2011-07-14 (×3): qty 250

## 2011-07-14 MED ORDER — TEMAZEPAM 15 MG PO CAPS
15.0000 mg | ORAL_CAPSULE | Freq: Every evening | ORAL | Status: DC | PRN
  Administered 2011-07-16: 15 mg via ORAL
  Filled 2011-07-14 (×2): qty 2

## 2011-07-14 MED ORDER — SODIUM CHLORIDE 0.9 % IV SOLN: INTRAVENOUS | Status: DC

## 2011-07-14 MED ORDER — SUMATRIPTAN SUCCINATE 100 MG PO TABS
100.0000 mg | ORAL_TABLET | ORAL | Status: DC | PRN
  Filled 2011-07-14: qty 1

## 2011-07-14 MED ORDER — CITALOPRAM HYDROBROMIDE 20 MG PO TABS
20.0000 mg | ORAL_TABLET | Freq: Every day | ORAL | Status: DC
  Administered 2011-07-14 – 2011-07-16 (×3): 20 mg via ORAL
  Filled 2011-07-14 (×3): qty 1

## 2011-07-14 MED ORDER — GABAPENTIN 300 MG PO CAPS
300.0000 mg | ORAL_CAPSULE | Freq: Two times a day (BID) | ORAL | Status: DC
  Administered 2011-07-14 – 2011-07-16 (×4): 300 mg via ORAL
  Filled 2011-07-14 (×5): qty 1

## 2011-07-14 MED ORDER — METHOCARBAMOL 500 MG PO TABS
500.0000 mg | ORAL_TABLET | Freq: Three times a day (TID) | ORAL | Status: DC | PRN
  Administered 2011-07-16: 500 mg via ORAL
  Filled 2011-07-14: qty 1

## 2011-07-14 MED ORDER — PANTOPRAZOLE SODIUM 40 MG IV SOLR
40.0000 mg | INTRAVENOUS | Status: DC
  Administered 2011-07-14 – 2011-07-15 (×2): 40 mg via INTRAVENOUS
  Filled 2011-07-14 (×3): qty 40

## 2011-07-14 MED ORDER — SODIUM CHLORIDE 0.9 % IV SOLN
INTRAVENOUS | Status: DC
  Administered 2011-07-14: 150 mL/h via INTRAVENOUS
  Administered 2011-07-15: 14:00:00 via INTRAVENOUS
  Administered 2011-07-15: 150 mL/h via INTRAVENOUS

## 2011-07-14 MED ORDER — HYDROCODONE-ACETAMINOPHEN 10-325 MG PO TABS: 1.0000 | ORAL_TABLET | Freq: Four times a day (QID) | ORAL | Status: DC | PRN

## 2011-07-14 MED ORDER — FLUTICASONE PROPIONATE 50 MCG/ACT NA SUSP
2.0000 | Freq: Every day | NASAL | Status: DC
  Administered 2011-07-14 – 2011-07-16 (×3): 2 via NASAL
  Filled 2011-07-14: qty 16

## 2011-07-14 MED ORDER — MOXIFLOXACIN HCL IN NACL 400 MG/250ML IV SOLN: 400.0000 mg | INTRAVENOUS | Status: DC

## 2011-07-14 MED ORDER — ENOXAPARIN SODIUM 40 MG/0.4ML ~~LOC~~ SOLN
40.0000 mg | SUBCUTANEOUS | Status: DC
  Administered 2011-07-14 – 2011-07-15 (×2): 40 mg via SUBCUTANEOUS
  Filled 2011-07-14 (×3): qty 0.4

## 2011-07-14 MED ORDER — CITALOPRAM HYDROBROMIDE 10 MG PO TABS: 20.0000 mg | ORAL_TABLET | Freq: Every day | ORAL | Status: DC

## 2011-07-14 MED ORDER — GABAPENTIN 600 MG PO TABS: 300.0000 mg | ORAL_TABLET | Freq: Two times a day (BID) | ORAL | Status: DC

## 2011-07-14 MED ORDER — ONDANSETRON HCL 4 MG/2ML IJ SOLN
4.0000 mg | Freq: Four times a day (QID) | INTRAMUSCULAR | Status: DC | PRN
  Administered 2011-07-15: 4 mg via INTRAVENOUS
  Filled 2011-07-14: qty 2

## 2011-07-14 NOTE — ED Provider Notes (Signed)
Patient seen and evaluated and triaged. She was sent for direct admission by Dr. Arna Medici and he saw her earlier today. She has had vomiting as well as cough and fever or and is now dehydrated and orthostatic hypotension. She appears weak mucous membranes are dry. Her lungs are clear and her abdomen is benign. Patient was screened by me and now has a room available on the floor where she will be transported.  Dr. Debby Bud had written admission orders.   Ethelda Chick, MD 07/14/11 [phone]

## 2011-07-14 NOTE — H&P (Signed)
[name] is an 66 y.o. female.   Chief Complaint: weakness, cough, sputum production HPI: [name] developed N/V and severe abdominal pain Dec 26th. She was seen at ED Dec 27th - no fever, mild elevation BP. Lab revealed K 3.1, WBC 9.4 with 91% segs. She was given IVF and antiemetics. She was able to be discharged to home. Since then she has felt sick, had fever to 102 F, had myalgias, decreased PO intake and severe weakness. She has had a productive sticky brown type sputum. She denies being short of breath, no further abdominal pain, N/V/D Her weakness does persist. In the office she was orthostatic: supine 119/64 76; sitting 90/54 85. She is very uncomfortable and feels feint. She is now for 24 observation for IV hydration.   Past Medical History  Diagnosis Date  . Peripheral neuropathy   . Fibromyalgia   . Hyperlipemia   . Degenerative joint disease     cervical spine  . GERD (gastroesophageal reflux disease)   . Lumbar disc disease   . History of migraines   . Hypertension   . Depression   . Carotid artery disease     Doppler, April, 2012,  0-39% R. ICA, 60-79% LICA... no significant change... frequency of Doppler moved to yearly because of stability  . Chest pain     Nuclear stress test 2006 normal  . Hx of colonoscopy   . Drug therapy     msucle aches simva, but tolerates 5mg  Creastor    Past Surgical History  Procedure Date  . Appendectomy   . Laparoscopic ovarian cystectomy     Family History  Problem Relation Age of Onset  . Coronary artery disease Father   . Heart attack Father   . Heart disease Father     CAD/MI x 3, last fatal  . Diabetes Neg Hx   . Colon cancer Neg Hx   . Breast cancer Paternal Aunt   . Cancer Paternal Aunt     breast cancer  . Stroke Mother   . Dementia Mother   . Heart disease Mother     chf  . Obesity Brother   . Hypertension Brother   . Cancer Paternal Aunt     breast cancer   Social History:  reports that she quit  smoking about 15 years ago. She has never used smokeless tobacco. She reports that she does not drink alcohol or use illicit drugs.  Allergies:  Allergies  Allergen Reactions  . Celebrex (Celecoxib)   . Codeine     Medications Prior to Admission  Medication Sig Dispense Refill  . aspirin 81 MG tablet Take 81 mg by mouth daily.        . benzonatate (TESSALON) 100 MG capsule Take 1 capsule (100 mg total) by mouth 3 (three) times daily as needed.  30 capsule  2  . Casanthranol-Docusate Sodium 30-100 MG CAPS Take by mouth.        . citalopram (CELEXA) 20 MG tablet Take 1 tablet (20 mg total) by mouth daily.  90 tablet  3  . citalopram (CELEXA) 20 MG tablet TAKE 1 TABLET EVERY DAY  90 tablet  0  . clobetasol (TEMOVATE) 0.05 % ointment Apply topically 2 (two) times daily.  60 g  0  . fluocinonide (LIDEX) 0.05 % gel Apply topically as needed. Use  Bid as needed for oral ulcers  15 g  2  . fluticasone (FLONASE) 50 MCG/ACT nasal spray 2 sprays by Nasal route  daily.        . Folic Acid-Vit B6-Vit B12 (FA-VITAMIN B-6-VITAMIN B-12) 2.2-25-0.5 MG TABS Take by mouth daily.        Marland Kitchen gabapentin (NEURONTIN) 300 MG capsule Take 1 capsule (300 mg total) by mouth 2 (two) times daily.  180 capsule  3  . hydrochlorothiazide 25 MG tablet Take 1 tablet (25 mg total) by mouth daily.  90 tablet  3  . HYDROcodone-acetaminophen (NORCO) 10-325 MG per tablet Take 1 tablet by mouth every 6 (six) hours as needed.  60 tablet  3  . hydrocortisone (ANUSOL-HC) 25 MG suppository Place 25 mg rectally 2 (two) times daily.        . meloxicam (MOBIC) 15 MG tablet Take 1 tablet (15 mg total) by mouth daily.  90 tablet  3  . methocarbamol (ROBAXIN) 500 MG tablet ONE BY MOUTH THREE TIMES A DAY AS NEEDED BACK SPASM  35 tablet  5  . naratriptan (AMERGE) 1 MG TABS Take 1 tablet (1 mg total) by mouth once as needed. Take one (1) tablet at onset of headache; if returns or does not resolve, may repeat after 4 hours; do not exceed five (5)  mg in 24 hours.  36 tablet  3  . omeprazole (PRILOSEC) 40 MG capsule Take 1 capsule (40 mg total) by mouth daily.  90 capsule  3  . promethazine (PHENERGAN) 25 MG tablet Take 1 tablet (25 mg total) by mouth every 6 (six) hours as needed for nausea (or headache).  30 tablet  0  . rosuvastatin (CRESTOR) 5 MG tablet Take 1 tablet (5 mg total) by mouth daily. 3 times weekly  45 tablet  3  . temazepam (RESTORIL) 15 MG capsule Take 1 capsule (15 mg total) by mouth at bedtime as needed.  90 capsule  3  . temazepam (RESTORIL) 15 MG capsule Take 15 mg by mouth at bedtime as needed.        Marland Kitchen DISCONTD: temazepam (RESTORIL) 15 MG capsule Take 1 capsule (15 mg total) by mouth at bedtime as needed for sleep.  30 capsule  5  . naratriptan (AMERGE) 2.5 MG tablet Take 1 tablet (2.5 mg total) by mouth as needed for migraine. Take one (1) tablet at onset of headache; if returns or does not resolve, may repeat after 4 hours; do not exceed five (5) mg in 24 hours.  10 tablet  0  . potassium chloride (K-DUR) 10 MEQ tablet Take 2 tablets (20 mEq total) by mouth 2 (two) times daily.  20 tablet  0   No current facility-administered medications on file as of 07/14/2011.    No results found for this or any previous visit (from the past 48 hour(s)). No results found.  Review of Systems  Constitutional: Positive for fever, chills and malaise/fatigue. Negative for weight loss.  HENT: Positive for congestion. Negative for hearing loss, tinnitus and ear discharge.   Eyes: Negative.   Respiratory: Positive for cough and sputum production. Negative for hemoptysis.   Cardiovascular: Negative for chest pain, palpitations and leg swelling.  Gastrointestinal: Positive for nausea and vomiting. Negative for abdominal pain.  Genitourinary: Negative.   Musculoskeletal: Positive for myalgias.  Skin:       pallor  Neurological: Positive for dizziness and weakness. Negative for headaches.  Endo/Heme/Allergies: Negative.     Psychiatric/Behavioral: Negative.     Blood pressure 86/62, pulse 81, temperature 98.2 F (36.8 C), temperature source Oral, resp. rate 14, weight 165 lb 6 oz (75.014  kg), SpO2 94.00%. Physical Exam  Constitutional: She is oriented to person, place, and time. She appears well-developed and well-nourished.       pale  HENT:  Head: Normocephalic and atraumatic.  Right Ear: External ear normal.  Left Ear: External ear normal.  Mouth/Throat: Oropharynx is clear and moist.  Eyes: Conjunctivae and EOM are normal. Pupils are equal, round, and reactive to light.  Neck: Normal range of motion. Neck supple. No tracheal deviation present. No thyromegaly present.  Cardiovascular: Normal rate, regular rhythm, normal heart sounds and intact distal pulses.   Respiratory: Effort normal and breath sounds normal. No respiratory distress.  GI: Soft. Bowel sounds are normal. There is no tenderness. There is no rebound and no guarding.  Musculoskeletal: Normal range of motion. She exhibits no edema.  Neurological: She is alert and oriented to person, place, and time.  Skin: No rash noted. No erythema.       pale  Psychiatric: She has a normal mood and affect. Her behavior is normal.     Assessment/Plan 1. Dehydration - patient with possible gastroenteritis with poor intake now with orthostatic hypotension Plan - IVF NS at 150 cc/hr  2. Cough - patient with last ED lab with a left shift, now with fever to 102 F, cough that is productive of brown sputum. Lungs sound clear on exam c/w bronchitis Plan - routine lab           PA&Lat CXR           Avelox 400 mg q 24  Verline Kong 07/14/2011, 1:29 PM

## 2011-07-14 NOTE — Telephone Encounter (Signed)
May add on to today's schedule

## 2011-07-14 NOTE — Progress Notes (Signed)
Attempted unsuccessfully x2 to start an IV for hydration at the request of Dr. Debby Bud. Site A Right hand dorsal aspect with bruising. Pressure applied along with an aseptic dressing. Site B Right antecubital fossa, bruising noted at site, pressure applied to site along with an aseptic dressing. Pt tolerated the attempts well. Comfort measures of a cool cloth to forehead, positioning and blankets provided to patient.

## 2011-07-14 NOTE — Progress Notes (Signed)
  Subjective:    Patient ID: HINLEY BRIMAGE, female    DOB: 12-09-1945, 66 y.o.   MRN: [id number]  HPI Mrs. Rowen developed N/V and severe abdominal pain Dec 26th. She was seen at ED Dec 27th - no fever, mild elevation BP. Lab revealed K 3.1, WBC 9.4 with 91% segs. She was given IVF and antiemetics. She was able to be discharged to home. Since then she has felt sick, had fever to 102 F, had myalgias, decreased PO intake and severe weakness. She has had a productive sticky brown type sputum. She denies being short of breath, no further abdominal pain, N/V/D Her weakness does persist. In the office she was orthostatic: supine 119/64 76; sitting 90/54 85. She is very uncomfortable and feels feint. She is now for 24 observation for IV hydration.   Review of Systems     Objective:   Physical Exam        Assessment & Plan:

## 2011-07-14 NOTE — ED Notes (Signed)
Pt was seen here last Thursday for nausea, "general malaise".  Received IVF NS here and was snet home.  She was seen at [facility] today and sent here for the IV therapy and rehydration

## 2011-07-15 DIAGNOSIS — K5289 Other specified noninfective gastroenteritis and colitis: Secondary | ICD-10-CM | POA: Diagnosis not present

## 2011-07-15 DIAGNOSIS — E860 Dehydration: Secondary | ICD-10-CM | POA: Diagnosis not present

## 2011-07-15 LAB — CBC
HCT: 38.7 % (ref 36.0–46.0)
Hemoglobin: 12.8 g/dL (ref 12.0–15.0)
MCH: 29.2 pg (ref 26.0–34.0)
MCHC: 33.1 g/dL (ref 30.0–36.0)
MCV: 88.4 fL (ref 78.0–100.0)
Platelets: 240 10*3/uL (ref 150–400)
RBC: 4.38 MIL/uL (ref 3.87–5.11)
RDW: 13.1 % (ref 11.5–15.5)
WBC: 4.7 10*3/uL (ref 4.0–10.5)

## 2011-07-15 LAB — BASIC METABOLIC PANEL
BUN: 13 mg/dL (ref 6–23)
CO2: 26 mEq/L (ref 19–32)
Calcium: 8.4 mg/dL (ref 8.4–10.5)
Chloride: 107 mEq/L (ref 96–112)
Creatinine, Ser: 0.87 mg/dL (ref 0.50–1.10)
GFR calc Af Amer: 79 mL/min — ABNORMAL LOW (ref >90)
GFR calc non Af Amer: 68 mL/min — ABNORMAL LOW (ref >90)
Glucose, Bld: 94 mg/dL (ref 70–99)
Potassium: 4.2 mEq/L (ref 3.5–5.1)
Sodium: 140 mEq/L (ref 135–145)

## 2011-07-15 MED ORDER — PSEUDOEPHEDRINE HCL 30 MG PO TABS
30.0000 mg | ORAL_TABLET | Freq: Three times a day (TID) | ORAL | Status: DC
  Administered 2011-07-15 – 2011-07-16 (×3): 30 mg via ORAL
  Filled 2011-07-15 (×6): qty 1

## 2011-07-15 NOTE — Progress Notes (Signed)
Subjective: Has had a tough day with Nausea, weakness, no appetite. Denies SOB, abdominal pain. Has had a BM  Objective: Lab: CBC    Component Value Date/Time   WBC 4.7 07/15/2011 0727   RBC 4.38 07/15/2011 0727   HGB 12.8 07/15/2011 0727   HCT 38.7 07/15/2011 0727   PLT 240 07/15/2011 0727   MCV 88.4 07/15/2011 0727   MCH 29.2 07/15/2011 0727   MCHC 33.1 07/15/2011 0727   RDW 13.1 07/15/2011 0727   LYMPHSABS 1.8 07/14/2011 1852   MONOABS 0.6 07/14/2011 1852   EOSABS 0.1 07/14/2011 1852   BASOSABS 0.0 07/14/2011 1852    BMET    Component Value Date/Time   NA 140 07/15/2011 0727   K 4.2 07/15/2011 0727   CL 107 07/15/2011 0727   CO2 26 07/15/2011 0727   GLUCOSE 94 07/15/2011 0727   GLUCOSE 103* 06/19/2006 0922   BUN 13 07/15/2011 0727   CREATININE 0.87 07/15/2011 0727   CALCIUM 8.4 07/15/2011 0727   GFRNONAA 68* 07/15/2011 0727   GFRAA 79* 07/15/2011 0727      Imaging: CXR 1/3 - normal  Physical Exam: Filed Vitals:   07/15/11 1300  BP: 143/73  Pulse: 81  Temp: 97.8 F (36.6 C)  Resp: 20  gen'l - overweight white woman who has better color Chest- good BS, no rales or wheezes Cor - RRR Abd - BS +, soft, no guarding or rebound, no real tenderness Neuro- A&O x 3     Assessment/Plan: 1. Dehydration - BP better. Orthostatics lying to sitting still with a 16 point drop. Plan - continue IVF @ 150 cc/hr  2. GAstroenteritis - labs are normal. Plan - supportive care. Diet as tolerated.   Hopefully home in AM   Donnae Michels 07/15/2011, 3:25 PM

## 2011-07-15 NOTE — Progress Notes (Signed)
0900 Pt alert, pale, sitting up in bed.  States she tried to get up "alone" but felt faint. VSS.  Resp unlabored.  No acute distress noted.  Instructed pt to call staff before getting OOB alone. Ebony Hail RN 07/15/11

## 2011-07-16 ENCOUNTER — Other Ambulatory Visit: Payer: Self-pay | Admitting: *Deleted

## 2011-07-16 DIAGNOSIS — E860 Dehydration: Secondary | ICD-10-CM | POA: Diagnosis not present

## 2011-07-16 DIAGNOSIS — K5289 Other specified noninfective gastroenteritis and colitis: Secondary | ICD-10-CM | POA: Diagnosis not present

## 2011-07-16 MED ORDER — PSEUDOEPHEDRINE HCL 30 MG PO TABS: 30.0000 mg | ORAL_TABLET | Freq: Three times a day (TID) | ORAL | Status: AC

## 2011-07-16 MED ORDER — MOXIFLOXACIN HCL 400 MG PO TABS: 400.0000 mg | ORAL_TABLET | Freq: Every day | ORAL | Status: AC

## 2011-07-16 MED ORDER — ONDANSETRON HCL 4 MG PO TABS: 4.0000 mg | ORAL_TABLET | Freq: Four times a day (QID) | ORAL | Status: AC | PRN

## 2011-07-16 NOTE — Discharge Summary (Signed)
[name], [name] NO.:  [id number]  MEDICAL RECORD NO.:  [id number]  LOCATION:  4506                         FACILITY:  [facility]  PHYSICIAN:  Rosalyn Gess. Norins, MD  DATE OF BIRTH:  08/14/1945  DATE OF ADMISSION:  07/14/2011 DATE OF DISCHARGE:  07/16/2011                              DISCHARGE SUMMARY   ADMITTING DIAGNOSES: 1. Severe dehydration with orthostatic changes. 2. Viral gastroenteritis.  DISCHARGE DIAGNOSES: 1. Severe dehydration with orthostatic changes. 2. Viral gastroenteritis.  CONSULTANTS:  None.  PROCEDURES:  Chest x-ray performed on the day of admission, which showed no evidence of acute cardiopulmonary abnormality.  HISTORY OF PRESENT ILLNESS:  Ms. Hemm presented to the office after having developed a severe abdominal discomfort and nausea on July 06, 2011.  She had been seen in the emergency department on July 07, 2011, but was afebrile and had mild elevation of blood pressure, potassium was slightly low at 3.1 with a normal white count, although she had a  91% segs with left shift.  She was given a L of IV fluids and antiemetics and discharged to home.  She has continued to feel sick. She reports after being in the emergency department  she developed a fever of 102, had diffuse myalgias, decreased p.o. intake, and severe weakness.  She also reports she had a productive cough with a sticky brown sputum but denied being short of breath.  She reports abdominal pain had resolved as had vomiting and diarrhea.  She did have significant weakness.  In the office, she had orthostasis with a supine blood pressure of 119/64 with a pulse of 76, sitting she was 90/54 with pulse of 85.  She was very uncomfortable, she felt same with moving from lying to sitting.  She was admitted for 24 hour observation for IV hydration, as well as for treatment of a possible bronchitis.  Please see the H and P for past medical history, family history,  and social history.  HOSPITAL COURSE: 1. Dehydration.  The patient was started on IV fluids at 150 an hour.     She had repeat vital signs, and on July 15, 2011, her blood     pressure had a 15 point drop from lying to sitting and therefore     she was kept overnight.  This morning she reports she feels much     better.  She has been up moving about walking without     lightheadedness or dizziness.  Her blood pressure has been back to     her baseline which is mildly elevated.  She has had no nausea or     vomiting.  She has been able tolerate a diet.  With the patient's     dehydration having resolved, she is now ready for discharge to     home. 2. Bronchitis.  The patient with probable bronchitis given her brown     sputum.  She was started on IV antibiotics of Avelox 400 mg q. 24     hours.  She would continue this for an additional 3 days after     discharge.  At the time of discharge, she has no cough, no  wheezing,  no respiratory distress  DISCHARGE EXAMINATION:  VITAL SIGNS:  Temperature was 98.5, blood pressure was 148/77, heart rate 72, respirations 16, O2 sats 95% on room air. GENERAL APPEARANCE:  This is an overweight Caucasian female, lying in bed, in no acute distress. HEENT:  Unremarkable. NECK:  Supple. CHEST:  The patient is moving air well.  There is no increased work of breathing, and there are no rales or wheezes. CARDIOVASCULAR:  2+ radial pulse.  Her precordium was quiet.  She had a regular rate and rhythm. ABDOMEN:  Obese, soft with no guarding or rebound.  She has positive bowel sounds.  There is no tenderness to deep palpation. PELVIC:  Deferred. RECTAL:  Deferred. EXTREMITIES:  Without deformity. NEUROLOGIC:  The patient is awake, alert.  She is oriented to person, place, time, and context.  Her speech is clear.  FINAL LABORATORY:  CBC from July 15, 2011, with a white count of 4700, hemoglobin 12.8 g, platelet count 140,000.  Chemistries with a  potassium of 4.2, creatinine 0.87.  DISPOSITION:  The patient is discharged to home.  She will continue all her home medications without change except for the addition of Avelox 400 mg p.o. daily for 3 additional days.  The patient is not to be scheduled for a followup and will call the office on an as-needed basis.  CONDITION:  At the time of discharge dictation is stable and improved.     Rosalyn Gess Norins, MD     MEN/MEDQ  D:  07/16/2011  T:  07/16/2011  Job:  [id number]

## 2011-07-16 NOTE — Progress Notes (Signed)
Had a good night. Has taken a diet. No sizziness. BP ok  Plan - d/c home           Continue avelox for 3 days  Dictation#[id number]

## 2011-08-04 ENCOUNTER — Telehealth: Payer: Self-pay | Admitting: Cardiology

## 2011-08-04 NOTE — Telephone Encounter (Signed)
New Problem:     Patient called in to schedule an appointment to see Dr. Myrtis Ser because her blood pressure is pretty high and the only medication she is taking is a dieretic. Please call back.

## 2011-08-04 NOTE — Telephone Encounter (Signed)
Pt is calling because she has had problems controlling her BP and feels she may need a bp medication.   Appt scheduled with Norma Fredrickson.

## 2011-08-08 ENCOUNTER — Ambulatory Visit: Payer: Medicare Other | Admitting: Nurse Practitioner

## 2011-08-12 ENCOUNTER — Ambulatory Visit (INDEPENDENT_AMBULATORY_CARE_PROVIDER_SITE_OTHER): Payer: Medicare Other | Admitting: Nurse Practitioner

## 2011-08-12 ENCOUNTER — Encounter: Payer: Self-pay | Admitting: *Deleted

## 2011-08-12 ENCOUNTER — Encounter: Payer: Self-pay | Admitting: Nurse Practitioner

## 2011-08-12 VITALS — BP 180/100 | HR 74 | Ht 62.0 in | Wt 169.0 lb

## 2011-08-12 DIAGNOSIS — R079 Chest pain, unspecified: Secondary | ICD-10-CM

## 2011-08-12 DIAGNOSIS — I10 Essential (primary) hypertension: Secondary | ICD-10-CM | POA: Diagnosis not present

## 2011-08-12 LAB — BASIC METABOLIC PANEL
BUN: 20 mg/dL (ref 6–23)
CO2: 30 mEq/L (ref 19–32)
Calcium: 9.1 mg/dL (ref 8.4–10.5)
Chloride: 103 mEq/L (ref 96–112)
Creatinine, Ser: 1 mg/dL (ref 0.4–1.2)
GFR: 61.71 mL/min (ref >60.00)
Glucose, Bld: 98 mg/dL (ref 70–99)
Potassium: 3.4 mEq/L — ABNORMAL LOW (ref 3.5–5.1)
Sodium: 139 mEq/L (ref 135–145)

## 2011-08-12 NOTE — Patient Instructions (Addendum)
We are going to put you on Benicar Hct 40/25 to take daily. Use the samples  I need to see you back early next week. We will recheck your labs then as well but you do not need to fast.  I will see you back early next week.   Continue to monitor your blood pressure at home.  Call the [facility] office at [phone] if you have any questions, problems or concerns.

## 2011-08-12 NOTE — Progress Notes (Signed)
[name] Date of Birth: Sep 03, 1945 Medical Record #[id number]  History of Present Illness: Ms. Nadeau is seen today for a work in visit. She is seen for Dr. Myrtis Ser. She has known carotid disease and hyperlipidemia. She called in several days ago to report that she was having problems with her blood pressure. She feels like she needs more medicine. She has been on ARB therapy in the past.   She comes in today. She has lots of complaints. She has been under a lot of stress with a daughter in law in [place] who is in a "halo" and not doing well. She has been slow to recover from her flu a month ago. Doesn't feel very motivated to do anything. Has had more dull headaches. No vision problems. Blood pressure has been creeping up. No medicine taken today. Her HCTZ does a good job managing her swelling. She notes that her chest gets achy. Her back hurts. She has had issues with her stomach. Bowels are ok. She does not exercise.  Her cuff is checked here today and does correlate.   Current Outpatient Prescriptions on File Prior to Visit  Medication Sig Dispense Refill  . aspirin 81 MG tablet Take 81 mg by mouth daily.       . benzonatate (TESSALON) 100 MG capsule Take 100 mg by mouth 3 (three) times daily as needed. For cough       . citalopram (CELEXA) 20 MG tablet Take 10 mg by mouth daily.        . fluocinonide gel (LIDEX) 0.05 % Apply 1 application topically 2 (two) times daily as needed. for oral ulcers       . fluticasone (FLONASE) 50 MCG/ACT nasal spray 2 sprays by Nasal route daily.        Marland Kitchen gabapentin (NEURONTIN) 300 MG capsule Take 1 capsule (300 mg total) by mouth 2 (two) times daily.  180 capsule  3  . hydrochlorothiazide 25 MG tablet Take 1 tablet (25 mg total) by mouth daily.  90 tablet  3  . HYDROcodone-acetaminophen (NORCO) 10-325 MG per tablet Take 1 tablet by mouth every 6 (six) hours as needed. For pain       . meloxicam (MOBIC) 15 MG tablet Take 1 tablet (15 mg total) by  mouth daily.  90 tablet  3  . naratriptan (AMERGE) 1 MG TABS Take 1 tablet (1 mg total) by mouth once as needed. Take one (1) tablet at onset of headache; if returns or does not resolve, may repeat after 4 hours; do not exceed five (5) mg in 24 hours.  36 tablet  3  . omeprazole (PRILOSEC) 40 MG capsule Take 1 capsule (40 mg total) by mouth daily.  90 capsule  3  . rosuvastatin (CRESTOR) 5 MG tablet Take 5 mg by mouth every other day. Take 1 tablet mon,wed,and fri      . temazepam (RESTORIL) 15 MG capsule Take 15 mg by mouth at bedtime as needed. For sleep       . methocarbamol (ROBAXIN) 500 MG tablet Take 500 mg by mouth 3 (three) times daily as needed. For pain/muscle spasms       . DISCONTD: temazepam (RESTORIL) 15 MG capsule Take 1 capsule (15 mg total) by mouth at bedtime as needed for sleep.  30 capsule  5   Current Facility-Administered Medications on File Prior to Visit  Medication Dose Route Frequency Provider Last Rate Last Dose  . citalopram (CELEXA) tablet 20 mg  20 mg Oral Daily Duke Salvia, MD        Allergies  Allergen Reactions  . Codeine Nausea And Vomiting  . Celebrex (Celecoxib) Rash    Past Medical History  Diagnosis Date  . Peripheral neuropathy   . Fibromyalgia   . Hyperlipemia   . Degenerative joint disease     cervical spine  . GERD (gastroesophageal reflux disease)   . Lumbar disc disease   . Hypertension   . Depression   . Carotid artery disease     Doppler, April, 2012,  0-39% R. ICA, 60-79% LICA... no significant change... frequency of Doppler moved to yearly because of stability  . Chest pain     Nuclear stress test 2006 normal  . Hx of colonoscopy   . Drug therapy     msucle aches with simvastatin, but tolerates 5mg  Creastor  . Migraines   . Small fiber neuropathy   . Obesity     Past Surgical History  Procedure Date  . Laparoscopic ovarian cystectomy 1960's  . Appendectomy 1961  . Shoulder surgery 2004    "frozen"; right  .  Trigger finger release 1996    right thumb    History  Smoking status  . Former Smoker -- 1.0 packs/day for 15 years  . Types: Cigarettes  . Quit date: 07/11/1996  Smokeless tobacco  . Never Used    History  Alcohol Use No    Family History  Problem Relation Age of Onset  . Coronary artery disease Father   . Heart attack Father   . Heart disease Father     CAD/MI x 3, last fatal  . Diabetes Neg Hx   . Colon cancer Neg Hx   . Breast cancer Paternal Aunt   . Cancer Paternal Aunt     breast cancer  . Stroke Mother   . Dementia Mother   . Heart disease Mother     chf  . Obesity Brother   . Hypertension Brother   . Cancer Paternal Aunt     breast cancer    Review of Systems: The review of systems is per the HPI.  She had the flu a month ago and was dehydrated. This has resolved but she has been slow to recover. All other systems were reviewed and are negative.  Physical Exam: BP 204/112  Pulse 74  Ht 5\' 2"  (1.575 m)  Wt 169 lb (76.658 kg)  BMI 30.91 kg/m2 Blood pressure by me is 220/110 in both arms with the large cuff.  Patient is very pleasant and in no acute distress. Well dressed. Skin is warm and dry. Color is normal.  HEENT is unremarkable. Normocephalic/atraumatic. PERRL. Sclera are nonicteric. Neck is supple. No masses. No JVD. Lungs are clear. Cardiac exam shows a regular rate and rhythm. Abdomen is obese but soft. Extremities are without edema. Gait and ROM are intact. No gross neurologic deficits noted.  LABORATORY DATA: BMET is pending. Her EKG today shows sinus rhythm and is normal.    Assessment / Plan:

## 2011-08-12 NOTE — Assessment & Plan Note (Addendum)
She describes an aching chest discomfort. No symptoms with exertion but admits to no real activity. Will check EKG today. Need to treat her blood pressure. May need to update her stress test as well.   Her EKG today is normal. Does have multiple risk factors for CAD. We will focus on getting her blood pressure down first and then will update the stress test.

## 2011-08-12 NOTE — Assessment & Plan Note (Addendum)
Blood pressure is grossly elevated. She has a multitude of complaints. Stress is a big factor I think in this situation.  She has not had her HCTZ today. I have given her a dose of Benicar Hct 40/25 here in the office. We will need to check a BMET today. Will check an EKG as well.   By 10 am blood pressure is down to just 180/100. Her EKG is normal. I have talked with Dr. Myrtis Ser. We both feel it is acceptable for to go home with early follow up next week. She will monitor her blood pressure at home. Her cuff does correlate. We will check the BMET today. I will see her early next week and recheck BMET on return. I do suspect she will need additional blood pressure medicine. Once we get her blood pressure down, will arrange for repeat stress testing. Patient is agreeable to this plan and will call if any problems develop in the interim.

## 2011-08-16 ENCOUNTER — Ambulatory Visit (INDEPENDENT_AMBULATORY_CARE_PROVIDER_SITE_OTHER): Payer: Medicare Other | Admitting: Nurse Practitioner

## 2011-08-16 ENCOUNTER — Encounter: Payer: Self-pay | Admitting: Nurse Practitioner

## 2011-08-16 VITALS — BP 160/90 | HR 76 | Ht 62.0 in | Wt 169.0 lb

## 2011-08-16 DIAGNOSIS — R079 Chest pain, unspecified: Secondary | ICD-10-CM | POA: Diagnosis not present

## 2011-08-16 DIAGNOSIS — I10 Essential (primary) hypertension: Secondary | ICD-10-CM | POA: Diagnosis not present

## 2011-08-16 LAB — BASIC METABOLIC PANEL
BUN: 20 mg/dL (ref 6–23)
CO2: 30 mEq/L (ref 19–32)
Calcium: 9.1 mg/dL (ref 8.4–10.5)
Chloride: 104 mEq/L (ref 96–112)
Creatinine, Ser: 0.9 mg/dL (ref 0.4–1.2)
GFR: 68.23 mL/min (ref >60.00)
Glucose, Bld: 76 mg/dL (ref 70–99)
Potassium: 4 mEq/L (ref 3.5–5.1)
Sodium: 139 mEq/L (ref 135–145)

## 2011-08-16 MED ORDER — AMLODIPINE BESYLATE 5 MG PO TABS: 5.0000 mg | ORAL_TABLET | Freq: Every day | ORAL | Status: DC

## 2011-08-16 NOTE — Assessment & Plan Note (Addendum)
Blood pressure is trending down but still not at goal. She does feel better clinically.  Will add Norvasc 5 mg daily. I cautioned her about edema. She will continue to monitor her blood pressure at home. I will see her back in 2 weeks. We will also recheck a BMET today. Patient is agreeable to this plan and will call if any problems develop in the interim.

## 2011-08-16 NOTE — Patient Instructions (Signed)
Stay on your current medicines.  We are adding Norvasc 5 mg to take daily.  Continue to monitor your blood pressure at home. Record your readings.  Minimize your salt.  Ok to start walking.   I will see you in 2 weeks.   Call the [facility] office at [phone] if you have any questions, problems or concerns.

## 2011-08-16 NOTE — Progress Notes (Signed)
[name] Date of Birth: 1946/04/21 Medical Record #[id number]  History of Present Illness: [name] is seen back today for a follow up visit. She is seen for Dr. Myrtis Ser. She was here this past Friday with elevated blood pressure and multiple somatic complaints. We now have her on Benicar Hct 40/25. She had been on ARB therapy in the remote past.  She has been under more stress with family issues. Her last stress test dates back to 2006. She does have known carotid disease and has yearly follow up.   She comes back today for early follow up. She is feeling better. Blood pressure is trending down somewhat. No chest pain. Seems to have more energy. No more headaches. Wanting to start walking some. Potassium was low on Friday at 3.4. Hopefully this will improve with the Benicar.   Current Outpatient Prescriptions on File Prior to Visit  Medication Sig Dispense Refill  . aspirin 81 MG tablet Take 81 mg by mouth daily.       . benzonatate (TESSALON) 100 MG capsule Take 100 mg by mouth 3 (three) times daily as needed. For cough       . citalopram (CELEXA) 20 MG tablet Take 10 mg by mouth daily.        . fluocinonide gel (LIDEX) 0.05 % Apply 1 application topically 2 (two) times daily as needed. for oral ulcers       . fluticasone (FLONASE) 50 MCG/ACT nasal spray 2 sprays by Nasal route daily.        Marland Kitchen gabapentin (NEURONTIN) 300 MG capsule Take 1 capsule (300 mg total) by mouth 2 (two) times daily.  180 capsule  3  . HYDROcodone-acetaminophen (NORCO) 10-325 MG per tablet Take 1 tablet by mouth every 6 (six) hours as needed. For pain       . meloxicam (MOBIC) 15 MG tablet Take 1 tablet (15 mg total) by mouth daily.  90 tablet  3  . methocarbamol (ROBAXIN) 500 MG tablet Take 500 mg by mouth 3 (three) times daily as needed. For pain/muscle spasms       . naratriptan (AMERGE) 1 MG TABS Take 1 tablet (1 mg total) by mouth once as needed. Take one (1) tablet at onset of headache; if returns or does not  resolve, may repeat after 4 hours; do not exceed five (5) mg in 24 hours.  36 tablet  3  . olmesartan-hydrochlorothiazide (BENICAR HCT) 40-25 MG per tablet Take 1 tablet by mouth daily.      Marland Kitchen omeprazole (PRILOSEC) 40 MG capsule Take 1 capsule (40 mg total) by mouth daily.  90 capsule  3  . rosuvastatin (CRESTOR) 5 MG tablet Take 5 mg by mouth every other day. Take 1 tablet mon,wed,and fri      . temazepam (RESTORIL) 15 MG capsule Take 15 mg by mouth at bedtime as needed. For sleep       . DISCONTD: temazepam (RESTORIL) 15 MG capsule Take 1 capsule (15 mg total) by mouth at bedtime as needed for sleep.  30 capsule  5   Current Facility-Administered Medications on File Prior to Visit  Medication Dose Route Frequency Provider Last Rate Last Dose  . citalopram (CELEXA) tablet 20 mg  20 mg Oral Daily Duke Salvia, MD        Allergies  Allergen Reactions  . Codeine Nausea And Vomiting  . Celebrex (Celecoxib) Rash    Past Medical History  Diagnosis Date  . Peripheral neuropathy   .  Fibromyalgia   . Hyperlipemia   . Degenerative joint disease     cervical spine  . GERD (gastroesophageal reflux disease)   . Lumbar disc disease   . Hypertension   . Depression   . Carotid artery disease     Doppler, April, 2012,  0-39% R. ICA, 60-79% LICA... no significant change... frequency of Doppler moved to yearly because of stability  . Chest pain     Nuclear stress test 2006 normal  . Hx of colonoscopy   . Drug therapy     msucle aches with simvastatin, but tolerates 5mg  Creastor  . Migraines   . Small fiber neuropathy   . Obesity     Past Surgical History  Procedure Date  . Laparoscopic ovarian cystectomy 1960's  . Appendectomy 1961  . Shoulder surgery 2004    "frozen"; right  . Trigger finger release 1996    right thumb    History  Smoking status  . Former Smoker -- 1.0 packs/day for 15 years  . Types: Cigarettes  . Quit date: 07/11/1996  Smokeless tobacco  . Never  Used    History  Alcohol Use No    Family History  Problem Relation Age of Onset  . Coronary artery disease Father   . Heart attack Father   . Heart disease Father     CAD/MI x 3, last fatal  . Diabetes Neg Hx   . Colon cancer Neg Hx   . Breast cancer Paternal Aunt   . Cancer Paternal Aunt     breast cancer  . Stroke Mother   . Dementia Mother   . Heart disease Mother     chf  . Obesity Brother   . Hypertension Brother   . Cancer Paternal Aunt     breast cancer    Review of Systems: The review of systems is per the HPI.  Her HCTZ usually does a good job with her peripheral edema. All other systems were reviewed and are negative.  Physical Exam: BP 160/90  Pulse 76  Ht 5\' 2"  (1.575 m)  Wt 169 lb (76.658 kg)  BMI 30.91 kg/m2 Patient is very pleasant and in no acute distress. Skin is warm and dry. Color is normal.  HEENT is unremarkable. Normocephalic/atraumatic. PERRL. Sclera are nonicteric. Neck is supple. No masses. No JVD. Lungs are clear. Cardiac exam shows a regular rate and rhythm. Abdomen is soft. Extremities are without edema. Gait and ROM are intact. No gross neurologic deficits noted.   Lab Results  Component Value Date   WBC 4.7 07/15/2011   HGB 12.8 07/15/2011   HCT 38.7 07/15/2011   PLT 240 07/15/2011   GLUCOSE 98 08/12/2011   CHOL 191 03/22/2011   TRIG 136.0 03/22/2011   HDL 73.20 03/22/2011   LDLCALC 91 03/22/2011   ALT 19 07/14/2011   AST 26 07/14/2011   NA 139 08/12/2011   K 3.4* 08/12/2011   CL 103 08/12/2011   CREATININE 1.0 08/12/2011   BUN 20 08/12/2011   CO2 30 08/12/2011   TSH 1.16 12/18/2009     Assessment / Plan:

## 2011-08-16 NOTE — Assessment & Plan Note (Signed)
No further complaints of chest pain. She is going to start walking. Last stress test was in 2006. May consider updating in the future but for now will see how she does clinically. Patient is agreeable to this plan and will call if any problems develop in the interim.

## 2011-08-22 ENCOUNTER — Ambulatory Visit: Payer: Medicare Other | Admitting: Cardiology

## 2011-08-29 DIAGNOSIS — H2510 Age-related nuclear cataract, unspecified eye: Secondary | ICD-10-CM | POA: Diagnosis not present

## 2011-08-30 ENCOUNTER — Ambulatory Visit (INDEPENDENT_AMBULATORY_CARE_PROVIDER_SITE_OTHER): Payer: Medicare Other | Admitting: Nurse Practitioner

## 2011-08-30 ENCOUNTER — Encounter: Payer: Self-pay | Admitting: Nurse Practitioner

## 2011-08-30 VITALS — BP 140/82 | HR 72 | Ht 62.0 in | Wt 170.0 lb

## 2011-08-30 DIAGNOSIS — I10 Essential (primary) hypertension: Secondary | ICD-10-CM | POA: Diagnosis not present

## 2011-08-30 MED ORDER — AMLODIPINE BESYLATE 5 MG PO TABS: 5.0000 mg | ORAL_TABLET | Freq: Two times a day (BID) | ORAL | Status: DC

## 2011-08-30 MED ORDER — OLMESARTAN MEDOXOMIL-HCTZ 40-25 MG PO TABS: 1.0000 | ORAL_TABLET | Freq: Every day | ORAL | Status: DC

## 2011-08-30 NOTE — Progress Notes (Signed)
[name] Date of Birth: Jul 09, 1946 Medical Record #[id number]  History of Present Illness: [name] is seen today for a follow up visit. She is seen for Dr. Myrtis Ser. She has had Norvasc added to her medical regimen for HTN. She comes back today and reports that she is doing well. Blood pressure is trending down but not really at goal yet. She is tolerating her medicines. No swelling. No chest pain. Overall, she is feeling good. Not exercising due to her neuropathy.    Current Outpatient Prescriptions on File Prior to Visit  Medication Sig Dispense Refill  . amLODipine (NORVASC) 5 MG tablet Take 1 tablet (5 mg total) by mouth daily.  30 tablet  11  . aspirin 81 MG tablet Take 81 mg by mouth daily.       . benzonatate (TESSALON) 100 MG capsule Take 100 mg by mouth 3 (three) times daily as needed. For cough       . citalopram (CELEXA) 20 MG tablet Take 10 mg by mouth daily.        . fluocinonide gel (LIDEX) 0.05 % Apply 1 application topically 2 (two) times daily as needed. for oral ulcers       . fluticasone (FLONASE) 50 MCG/ACT nasal spray 2 sprays by Nasal route daily.        Marland Kitchen gabapentin (NEURONTIN) 300 MG capsule Take 1 capsule (300 mg total) by mouth 2 (two) times daily.  180 capsule  3  . HYDROcodone-acetaminophen (NORCO) 10-325 MG per tablet Take 1 tablet by mouth every 6 (six) hours as needed. For pain       . meloxicam (MOBIC) 15 MG tablet Take 1 tablet (15 mg total) by mouth daily.  90 tablet  3  . methocarbamol (ROBAXIN) 500 MG tablet Take 500 mg by mouth 3 (three) times daily as needed. For pain/muscle spasms       . naratriptan (AMERGE) 1 MG TABS Take 1 tablet (1 mg total) by mouth once as needed. Take one (1) tablet at onset of headache; if returns or does not resolve, may repeat after 4 hours; do not exceed five (5) mg in 24 hours.  36 tablet  3  . olmesartan-hydrochlorothiazide (BENICAR HCT) 40-25 MG per tablet Take 1 tablet by mouth daily.      Marland Kitchen omeprazole (PRILOSEC) 40 MG  capsule Take 1 capsule (40 mg total) by mouth daily.  90 capsule  3  . rosuvastatin (CRESTOR) 5 MG tablet Take 5 mg by mouth every other day. Take 1 tablet mon,wed,and fri      . temazepam (RESTORIL) 15 MG capsule Take 15 mg by mouth at bedtime as needed. For sleep       . DISCONTD: temazepam (RESTORIL) 15 MG capsule Take 1 capsule (15 mg total) by mouth at bedtime as needed for sleep.  30 capsule  5   Current Facility-Administered Medications on File Prior to Visit  Medication Dose Route Frequency Provider Last Rate Last Dose  . citalopram (CELEXA) tablet 20 mg  20 mg Oral Daily Duke Salvia, MD        Allergies  Allergen Reactions  . Codeine Nausea And Vomiting  . Celebrex (Celecoxib) Rash    Past Medical History  Diagnosis Date  . Peripheral neuropathy   . Fibromyalgia   . Hyperlipemia   . Degenerative joint disease     cervical spine  . GERD (gastroesophageal reflux disease)   . Lumbar disc disease   . Hypertension   .  Depression   . Carotid artery disease     Doppler, April, 2012,  0-39% R. ICA, 60-79% LICA... no significant change... frequency of Doppler moved to yearly because of stability  . Chest pain     Nuclear stress test 2006 normal  . Hx of colonoscopy   . Drug therapy     msucle aches with simvastatin, but tolerates 5mg  Creastor  . Migraines   . Small fiber neuropathy   . Obesity     Past Surgical History  Procedure Date  . Laparoscopic ovarian cystectomy 1960's  . Appendectomy 1961  . Shoulder surgery 2004    "frozen"; right  . Trigger finger release 1996    right thumb    History  Smoking status  . Former Smoker -- 1.0 packs/day for 15 years  . Types: Cigarettes  . Quit date: 07/11/1996  Smokeless tobacco  . Never Used    History  Alcohol Use No    Family History  Problem Relation Age of Onset  . Coronary artery disease Father   . Heart attack Father   . Heart disease Father     CAD/MI x 3, last fatal  . Diabetes Neg Hx     . Colon cancer Neg Hx   . Breast cancer Paternal Aunt   . Cancer Paternal Aunt     breast cancer  . Stroke Mother   . Dementia Mother   . Heart disease Mother     chf  . Obesity Brother   . Hypertension Brother   . Cancer Paternal Aunt     breast cancer    Review of Systems: The review of systems is positive for neuropathy of her feet.  All other systems were reviewed and are negative.  Physical Exam: BP 140/82  Pulse 72  Ht 5\' 2"  (1.575 m)  Wt 170 lb (77.111 kg)  BMI 31.09 kg/m2 Patient is very pleasant and in no acute distress. She is overweight. Skin is warm and dry. Color is normal.  HEENT is unremarkable. Normocephalic/atraumatic. PERRL. Sclera are nonicteric. Neck is supple. No masses. No JVD. Lungs are clear. Cardiac exam shows a regular rate and rhythm. Abdomen is soft. Extremities are without edema. Gait and ROM are intact. No gross neurologic deficits noted.  LABORATORY DATA:   Assessment / Plan:

## 2011-08-30 NOTE — Patient Instructions (Signed)
We are going to increase the Norvasc to two times a day. I have sent a new prescription to the drug store for the higher quantity.  Continue to monitor your blood pressure at home.  Here are my tips to lose weight:  1. Drink only water. You do not need milk, juice, tea, soda or diet soda.  2. Do not eat anything "white". This includes white bread, potatoes, rice or mayo  3. Stay away from fried foods and sweets  4. Your portion should be the size of the palm of your hand.  5. Know what your weaknesses are and avoid.  6. Find an exercise you like and do it every day for 45 to 60 minutes.   Keep your appointment with Dr. Myrtis Ser for early April.

## 2011-08-30 NOTE — Assessment & Plan Note (Signed)
Blood pressure is trending down but still not quite to goal. We will increase the Norvasc to BID. Could consider beta blocker on return if needed. Diet/exercise/weight loss is imperative and we discussed this in detail today. She will keep her appointment with Dr. Myrtis Ser for early April and she  will continue to monitor her blood pressure at home. Patient is agreeable to this plan and will call if any problems develop in the interim.

## 2011-08-31 DIAGNOSIS — M5430 Sciatica, unspecified side: Secondary | ICD-10-CM | POA: Diagnosis not present

## 2011-08-31 DIAGNOSIS — M999 Biomechanical lesion, unspecified: Secondary | ICD-10-CM | POA: Diagnosis not present

## 2011-09-05 ENCOUNTER — Telehealth: Payer: Self-pay | Admitting: Cardiology

## 2011-09-05 NOTE — Telephone Encounter (Signed)
Pt states that since she started taking the extra dose of amlodipine she has been having muscle spasms in her neck, weakness in her arms, dry eyes, "funny in the head" and pain in her back at the level of her bra line.  The symptoms last a few hours.  She stopped taking the extra dose of amlodipine on Saturday (last dose on Friday).  The symptoms have improved since stopping the extra dose but haven't totally resolved.

## 2011-09-05 NOTE — Telephone Encounter (Signed)
Pt to stop the evening dose of amlodipine, record blood pressure readings and come back in to the clinic to be seen in a month per Norma Fredrickson.  Pt agrees, appt scheduled.

## 2011-09-05 NOTE — Telephone Encounter (Signed)
Blood pressure was 123/68 and 120/71 yesterday.  Before that, it was running 147/76, 140/72, 137/77, 154/63 and 136/64.

## 2011-09-05 NOTE — Telephone Encounter (Signed)
Pt was taking amlodipine 1 tab in the am, then lori had her add one at night, had a bad headache that night, the next morning had arm and back pain, has had same pain each time took the night pill, stopped taking it, pls advise

## 2011-09-27 ENCOUNTER — Encounter: Payer: Self-pay | Admitting: Nurse Practitioner

## 2011-09-27 ENCOUNTER — Ambulatory Visit (INDEPENDENT_AMBULATORY_CARE_PROVIDER_SITE_OTHER): Payer: Medicare Other | Admitting: Nurse Practitioner

## 2011-09-27 VITALS — BP 142/78 | HR 68 | Ht 62.0 in | Wt 171.0 lb

## 2011-09-27 DIAGNOSIS — I10 Essential (primary) hypertension: Secondary | ICD-10-CM

## 2011-09-27 NOTE — Assessment & Plan Note (Signed)
Blood pressure remains above goal. She is not really wanting additional medicines. I have explained that we need for her do really work on her diet and weight loss. I think she could see significant improvement in her overall health. I have not changed her medicines today. She will continue to monitor at home. Weight Watchers is encouraged. We will see her back in a couple of months. Patient is agreeable to this plan and will call if any problems develop in the interim.

## 2011-09-27 NOTE — Patient Instructions (Signed)
Lets try working on your weight and trying to find an exercise program that you can do.  Stay on your current medicines.  We will see you back in a few months to reassess your blood pressure.  See Dr. Myrtis Ser as scheduled.   Call the [facility] office at [phone] if you have any questions, problems or concerns.

## 2011-09-27 NOTE — Progress Notes (Signed)
[name] Date of Birth: [birth year] Medical Record #[id number]  History of Present Illness: [name] is seen back today for a follow up visit. She has HTN and has had Norvasc added to her regimen. We increased the dose to BID at her last visit but she was not able to tolerate this dose. She felt "funny in her head" and was having some headache. She was cut back to just one a day.  She comes back for follow up. Blood pressure is still elevated in the 140 to 150 systolic range. She feels ok. Not able to exercise due to her neuropathy. Weight slowly climbing up. No chest pain. Not short of breath. Does not feel like she is depressed. She is really not interested in taking more medicines.   Current Outpatient Prescriptions on File Prior to Visit  Medication Sig Dispense Refill  . aspirin 81 MG tablet Take 81 mg by mouth daily.       . benzonatate (TESSALON) 100 MG capsule Take 100 mg by mouth 3 (three) times daily as needed. For cough       . citalopram (CELEXA) 20 MG tablet Take 10 mg by mouth daily.        . fluocinonide gel (LIDEX) 0.05 % Apply 1 application topically 2 (two) times daily as needed. for oral ulcers       . fluticasone (FLONASE) 50 MCG/ACT nasal spray 2 sprays by Nasal route daily.        Marland Kitchen gabapentin (NEURONTIN) 300 MG capsule Take 1 capsule (300 mg total) by mouth 2 (two) times daily.  180 capsule  3  . HYDROcodone-acetaminophen (NORCO) 10-325 MG per tablet Take 1 tablet by mouth every 6 (six) hours as needed. For pain       . meloxicam (MOBIC) 15 MG tablet Take 1 tablet (15 mg total) by mouth daily.  90 tablet  3  . methocarbamol (ROBAXIN) 500 MG tablet Take 500 mg by mouth 3 (three) times daily as needed. For pain/muscle spasms       . naratriptan (AMERGE) 1 MG TABS Take 1 tablet (1 mg total) by mouth once as needed. Take one (1) tablet at onset of headache; if returns or does not resolve, may repeat after 4 hours; do not exceed five (5) mg in 24 hours.  36 tablet   3  . olmesartan-hydrochlorothiazide (BENICAR HCT) 40-25 MG per tablet Take 1 tablet by mouth daily.  30 tablet  6  . omeprazole (PRILOSEC) 40 MG capsule Take 1 capsule (40 mg total) by mouth daily.  90 capsule  3  . rosuvastatin (CRESTOR) 5 MG tablet Take 5 mg by mouth every other day. Take 1 tablet mon,wed,and fri      . temazepam (RESTORIL) 15 MG capsule Take 15 mg by mouth at bedtime as needed. For sleep       . DISCONTD: amLODipine (NORVASC) 5 MG tablet Take 1 tablet (5 mg total) by mouth 2 (two) times daily.  60 tablet  11  . DISCONTD: temazepam (RESTORIL) 15 MG capsule Take 1 capsule (15 mg total) by mouth at bedtime as needed for sleep.  30 capsule  5   Current Facility-Administered Medications on File Prior to Visit  Medication Dose Route Frequency Provider Last Rate Last Dose  . DISCONTD: citalopram (CELEXA) tablet 20 mg  20 mg Oral Daily Jacques Navy, MD        Allergies  Allergen Reactions  . Codeine Nausea And Vomiting  .  Celebrex (Celecoxib) Rash    Past Medical History  Diagnosis Date  . Peripheral neuropathy   . Fibromyalgia   . Hyperlipemia   . Degenerative joint disease     cervical spine  . GERD (gastroesophageal reflux disease)   . Lumbar disc disease   . Hypertension   . Depression   . Carotid artery disease     Doppler, April, 2012,  0-39% R. ICA, 60-79% LICA... no significant change... frequency of Doppler moved to yearly because of stability  . Chest pain     Nuclear stress test 2006 normal  . Hx of colonoscopy   . Drug therapy     msucle aches with simvastatin, but tolerates 5mg  Creastor  . Migraines   . Small fiber neuropathy   . Obesity     Past Surgical History  Procedure Date  . Laparoscopic ovarian cystectomy 1960's  . Appendectomy 1961  . Shoulder surgery 2004    "frozen"; right  . Trigger finger release 1996    right thumb    History  Smoking status  . Former Smoker -- 1.0 packs/day for 15 years  . Types: Cigarettes  . Quit  date: 07/11/1996  Smokeless tobacco  . Never Used    History  Alcohol Use No    Family History  Problem Relation Age of Onset  . Coronary artery disease Father   . Heart attack Father   . Heart disease Father     CAD/MI x 3, last fatal  . Diabetes Neg Hx   . Colon cancer Neg Hx   . Breast cancer Paternal Aunt   . Cancer Paternal Aunt     breast cancer  . Stroke Mother   . Dementia Mother   . Heart disease Mother     chf  . Obesity Brother   . Hypertension Brother   . Cancer Paternal Aunt     breast cancer    Review of Systems: The review of systems is per the HPI.  All other systems were reviewed and are negative.  Physical Exam: BP 142/78  Pulse 68  Ht 5\' 2"  (1.575 m)  Wt 171 lb (77.565 kg)  BMI 31.28 kg/m2 Patient is very pleasant and in no acute distress. She is overweight. Skin is warm and dry. Color is normal.  HEENT is unremarkable. Normocephalic/atraumatic. PERRL. Sclera are nonicteric. Neck is supple. No masses. No JVD. Lungs are clear. Cardiac exam shows a regular rate and rhythm. Abdomen is soft. Extremities are without edema. Gait and ROM are intact. No gross neurologic deficits noted.   LABORATORY DATA:   Assessment / Plan:

## 2011-10-03 ENCOUNTER — Other Ambulatory Visit: Payer: Self-pay | Admitting: Cardiology

## 2011-10-03 DIAGNOSIS — I6529 Occlusion and stenosis of unspecified carotid artery: Secondary | ICD-10-CM

## 2011-10-06 ENCOUNTER — Encounter (INDEPENDENT_AMBULATORY_CARE_PROVIDER_SITE_OTHER): Payer: Medicare Other

## 2011-10-06 DIAGNOSIS — I6529 Occlusion and stenosis of unspecified carotid artery: Secondary | ICD-10-CM

## 2011-10-12 ENCOUNTER — Encounter: Payer: Self-pay | Admitting: Cardiology

## 2011-10-12 ENCOUNTER — Ambulatory Visit (INDEPENDENT_AMBULATORY_CARE_PROVIDER_SITE_OTHER): Payer: Medicare Other | Admitting: Cardiology

## 2011-10-12 VITALS — BP 140/78 | HR 72 | Ht 62.0 in | Wt 168.0 lb

## 2011-10-12 DIAGNOSIS — I779 Disorder of arteries and arterioles, unspecified: Secondary | ICD-10-CM | POA: Diagnosis not present

## 2011-10-12 DIAGNOSIS — E785 Hyperlipidemia, unspecified: Secondary | ICD-10-CM | POA: Diagnosis not present

## 2011-10-12 DIAGNOSIS — I10 Essential (primary) hypertension: Secondary | ICD-10-CM | POA: Diagnosis not present

## 2011-10-12 MED ORDER — OLMESARTAN MEDOXOMIL-HCTZ 40-25 MG PO TABS: 1.0000 | ORAL_TABLET | Freq: Every day | ORAL | Status: DC

## 2011-10-12 MED ORDER — AMLODIPINE BESYLATE 5 MG PO TABS: 5.0000 mg | ORAL_TABLET | Freq: Every day | ORAL | Status: DC

## 2011-10-12 NOTE — Assessment & Plan Note (Signed)
After various combinations of blood pressure medicines she is doing well on her current regimen. No change at this point. I will see her back in 3 months for followup. After that she can probably be seen yearly.

## 2011-10-12 NOTE — Patient Instructions (Signed)
Your physician recommends that you schedule a follow-up appointment in: 3 months.  

## 2011-10-12 NOTE — Progress Notes (Signed)
HPI Patient is seen today to followup hypertension and carotid artery disease. She's doing very well. [name] medicines were nicely adjusted Gilmore Laroche, NP. [name] blood pressure is now under good control. She's tolerating the medications well.  Allergies  Allergen Reactions  . Codeine Nausea And Vomiting  . Celebrex (Celecoxib) Rash    Current Outpatient Prescriptions  Medication Sig Dispense Refill  . amLODipine (NORVASC) 5 MG tablet Take 5 mg by mouth daily.      Marland Kitchen aspirin 81 MG tablet Take 81 mg by mouth daily.       . citalopram (CELEXA) 20 MG tablet Take 10 mg by mouth every other day. Take one half      . fluocinonide gel (LIDEX) 0.05 % Apply 1 application topically 2 (two) times daily as needed. for oral ulcers       . gabapentin (NEURONTIN) 300 MG capsule Take 1 capsule (300 mg total) by mouth 2 (two) times daily.  180 capsule  3  . HYDROcodone-acetaminophen (NORCO) 10-325 MG per tablet Take 1 tablet by mouth every 6 (six) hours as needed. For pain       . meloxicam (MOBIC) 15 MG tablet Take 1 tablet (15 mg total) by mouth daily.  90 tablet  3  . methocarbamol (ROBAXIN) 500 MG tablet Take 500 mg by mouth 3 (three) times daily as needed. For pain/muscle spasms       . naratriptan (AMERGE) 1 MG TABS Take 1 tablet (1 mg total) by mouth once as needed. Take one (1) tablet at onset of headache; if returns or does not resolve, may repeat after 4 hours; do not exceed five (5) mg in 24 hours.  36 tablet  3  . olmesartan-hydrochlorothiazide (BENICAR HCT) 40-25 MG per tablet Take 1 tablet by mouth daily.  30 tablet  6  . omeprazole (PRILOSEC) 40 MG capsule Take 1 capsule (40 mg total) by mouth daily.  90 capsule  3  . rosuvastatin (CRESTOR) 5 MG tablet Take 5 mg by mouth every other day. Take 1 tablet mon,wed,and fri      . temazepam (RESTORIL) 15 MG capsule Take 15 mg by mouth at bedtime as needed. For sleep       . benzonatate (TESSALON) 100 MG capsule Take 100 mg by mouth 3 (three) times  daily as needed. For cough       . fluticasone (FLONASE) 50 MCG/ACT nasal spray 2 sprays by Nasal route daily.        Marland Kitchen DISCONTD: temazepam (RESTORIL) 15 MG capsule Take 1 capsule (15 mg total) by mouth at bedtime as needed for sleep.  30 capsule  5    History   Social History  . Marital Status: Married    Spouse Name: N/A    Number of Children: 0  . Years of Education: 13   Occupational History  .     Social History Main Topics  . Smoking status: Former Smoker -- 1.0 packs/day for 15 years    Types: Cigarettes    Quit date: 07/11/1996  . Smokeless tobacco: Never Used  . Alcohol Use: No  . Drug Use: No  . Sexually Active: No   Other Topics Concern  . Not on file   Social History Narrative   HSG, [employer] school, AA degree [employer]. Married '64- 21 years/divorced; married '98. No children. 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    Family History  Problem Relation  Age of Onset  . Coronary artery disease Father   . Heart attack Father   . Heart disease Father     CAD/MI x 3, last fatal  . Diabetes Neg Hx   . Colon cancer Neg Hx   . Breast cancer Paternal Aunt   . Cancer Paternal Aunt     breast cancer  . Stroke Mother   . Dementia Mother   . Heart disease Mother     chf  . Obesity Brother   . Hypertension Brother   . Cancer Paternal Aunt     breast cancer    Past Medical History  Diagnosis Date  . Peripheral neuropathy   . Fibromyalgia   . Hyperlipemia   . Degenerative joint disease     cervical spine  . GERD (gastroesophageal reflux disease)   . Lumbar disc disease   . Hypertension   . Depression   . Carotid artery disease     Doppler, April, 2012,  0-39% R. ICA, 60-79% LICA... no significant change... frequency of Doppler moved to yearly because of stability  . Chest pain     Nuclear stress test 2006 normal  . Hx of colonoscopy   . Drug therapy     msucle aches with simvastatin, but tolerates  5mg  Creastor  . Migraines   . Small fiber neuropathy   . Obesity     Past Surgical History  Procedure Date  . Laparoscopic ovarian cystectomy 1960's  . Appendectomy 1961  . Shoulder surgery 2004    "frozen"; right  . Trigger finger release 1996    right thumb    ROS   Patient denies fever, chills, headache, sweats, rash, change in vision, change in hearing, chest pain, cough, nausea vomiting, urinary symptoms. All other systems are reviewed and are negative.  PHYSICAL EXAM  Patient is quite stable. There is no jugulovenous distention. Lungs are clear. Respiratory effort is nonlabored. Cardiac exam reveals S1 and S2. There are no clicks or significant murmurs. The abdomen is soft. Is no peripheral edema.  Filed Vitals:   10/12/11 1026  BP: 140/78  Pulse: 72  Height: 5\' 2"  (1.575 m)  Weight: 168 lb (76.204 kg)     ASSESSMENT & PLAN

## 2011-10-12 NOTE — Assessment & Plan Note (Signed)
Patient is tolerating a low dose of Crestor. This will be continued.

## 2011-10-27 ENCOUNTER — Other Ambulatory Visit: Payer: Self-pay | Admitting: *Deleted

## 2011-10-27 MED ORDER — HYDROCODONE-ACETAMINOPHEN 10-325 MG PO TABS: 1.0000 | ORAL_TABLET | Freq: Four times a day (QID) | ORAL | Status: DC | PRN

## 2011-10-27 NOTE — Telephone Encounter (Signed)
Request for Hydrocodone-APAP 10-325 mg [last refill 01.03.13 #60x3]. Please advise.

## 2011-10-27 NOTE — Telephone Encounter (Signed)
Done again

## 2011-10-27 NOTE — Telephone Encounter (Signed)
Done hardcopy to robin  

## 2011-10-27 NOTE — Telephone Encounter (Signed)
Did not receive hardcopy 

## 2011-10-28 NOTE — Telephone Encounter (Signed)
[facility]ed [facility] to [facility] @ [phone]... 10/28/11@8 :40am/LMB

## 2011-11-03 DIAGNOSIS — Z1231 Encounter for screening mammogram for malignant neoplasm of breast: Secondary | ICD-10-CM | POA: Diagnosis not present

## 2011-11-07 ENCOUNTER — Telehealth: Payer: Self-pay

## 2011-11-07 NOTE — Telephone Encounter (Signed)
Patient called, stating she has been having swelling in both ankles since started on amlodipine.Also having pain in in legs getting worse.Advised will check with Norma Fredrickson NP and call her back.

## 2011-11-07 NOTE — Telephone Encounter (Signed)
Patient called was told spoke with Catherine Fredrickson NP she advised to decrease amlodipine 5 mg to 1/2 tablet daily,monitor blood pressure.Advised to see PCP about leg pain and call back if swelling continues and if has any problems with B/P.

## 2011-11-08 ENCOUNTER — Ambulatory Visit (INDEPENDENT_AMBULATORY_CARE_PROVIDER_SITE_OTHER): Payer: Medicare Other | Admitting: Internal Medicine

## 2011-11-08 ENCOUNTER — Encounter: Payer: Self-pay | Admitting: Internal Medicine

## 2011-11-08 VITALS — BP 140/70 | HR 76 | Temp 98.3°F | Resp 16 | Wt 172.0 lb

## 2011-11-08 DIAGNOSIS — IMO0001 Reserved for inherently not codable concepts without codable children: Secondary | ICD-10-CM | POA: Diagnosis not present

## 2011-11-08 DIAGNOSIS — G609 Hereditary and idiopathic neuropathy, unspecified: Secondary | ICD-10-CM | POA: Diagnosis not present

## 2011-11-08 MED ORDER — DILTIAZEM HCL ER 180 MG PO CP24: 180.0000 mg | ORAL_CAPSULE | Freq: Every day | ORAL | Status: DC

## 2011-11-08 NOTE — Patient Instructions (Signed)
Leg pain - we have a tissue diagnosis of small fiber neuropathy. It would be unusual to have nerve fiber disease and fibromyalgia and degenerative disc disease with nerve impingement. This is less likely to be arthritis since it is not joint specific.  Plan - increase gabapentin to 300 mg three times a day. If this isn't enough then go slowly to 600 mg three times a day (300 mg AM, 300mg  mid-day, 600 mg PM; then increase the mid-day dose and finally the AM dose taking 2-3 days at each step. Keep follow-up appointment with Dr. Theodis Aguas.  Blood pressure - good today but if the amlodipine is causing swelling will try (for 30 days) a different drug - DiltiaXT 180 mg once a day. Hold the amlodipine.  Leg swelling - it may be the amlodipine but it may also be Venous Insufficiency since the swelling will wax during the day and wane over night. The treatment for Venous Insufficiency is elevation of the legs and the use of knee high support hose, starting with over the counter working girls stockings.    Venous Stasis and Chronic Venous Insufficiency As people age, the veins located in their legs may weaken and stretch. When veins weaken and lose the ability to pump blood effectively, the condition is called chronic venous insufficiency (CVI) or venous stasis. Almost all veins return blood back to the heart. This happens by:  The force of the heart pumping fresh blood pushes blood back to the heart.   Blood flowing to the heart from the force of gravity.  In the deep veins of the legs, blood has to fight gravity and flow upstream back to the heart. Here, the leg muscles contract to pump blood back toward the heart. Vein walls are elastic, and many veins have small valves that only allow blood to flow in one direction. When leg muscles contract, they push inward against the elastic vein walls. This squeezes blood upward, opens the valves, and moves blood toward the heart. When leg muscles relax, the vein wall also  relaxes and the valves inside the vein close to prevent blood from flowing backward. This method of pumping blood out of the legs is called the venous pump. CAUSES   The venous pump works best while walking and leg muscles are contracting. But when a person sits or stands, blood pressure in leg veins can build. Deep veins are usually able to withstand short periods of inactivity, but long periods of inactivity (and increased pressure) can stretch, weaken, and damage vein walls. High blood pressure can also stretch and damage vein walls. The veins may no longer be able to pump blood back to the heart. Venous hypertension (high blood pressure inside veins) that lasts over time is a primary cause of CVI. CVI can also be caused by:    Deep vein thrombosis, a condition where a thrombus (blood clot) blocks blood flow in a vein.   Phlebitis, an inflammation of a superficial vein that causes a blood clot to form.  Other risk factors for CVI may include:    Heredity.   Obesity.   Pregnancy.   Sedentary lifestyle.   Smoking.   Jobs requiring long periods of standing or sitting in one place.   Age and gender:   Women in their 60's and 33's and men in their 1's are more prone to developing CVI.  SYMPTOMS   Symptoms of CVI may include:    Varicose veins.   Ulceration or skin breakdown.  Lipodermatosclerosis, a condition that affects the skin just above the ankle, usually on the inside surface.  Over time the skin becomes brown, smooth, tight and often painful. Those with this condition have a high risk of developing skin ulcers.   Reddened or discolored skin on the leg.   Swelling.  DIAGNOSIS   Your caregiver can diagnose CVI after performing a careful medical history and physical examination. To confirm the diagnosis, the following tests may also be ordered:    Duplex ultrasound.   Plethysmography (tests blood flow).   Venograms (x-ray using a special dye).  TREATMENT The goals of  treatment for CVI are to restore a person to an active life and to minimize pain or disability. Typically, CVI does not pose a serious threat to life or limb, and with proper treatment most people with this condition can continue to lead active lives. In most cases, mild CVI can be treated on an outpatient basis with simple procedures. Treatment methods include:    Elastic compression socks.   Sclerotherapy, a procedure involving an injection of a material that "dissolves" the damaged veins. Other veins in the network of blood vessels take over the function of the damaged veins.   Vein stripping (an older procedure less commonly used).   Laser Ablation surgery.   Valve repair.  HOME CARE INSTRUCTIONS    Elastic compression socks must be worn every day. They can help with symptoms and lower the chances of the problem getting worse, but they do not cure the problem.   Only take over-the-counter or prescription medicines for pain, discomfort, or fever as directed by your caregiver.   Your caregiver will review your other medications with you.  SEEK MEDICAL CARE IF:    You are confused about how to take your medications.   There is redness, swelling, or increasing pain in the affected area.   There is a red streak or line that extends up or down from the affected area.   There is a breakdown or loss of skin in the affected area, even if the breakdown is small.   You develop an unexplained oral temperature above 102 F (38.9 C).   There is an injury to the affected area.  SEEK IMMEDIATE MEDICAL CARE IF:    There is an injury and open wound to the affected area.   Pain is not adequately relieved with pain medication prescribed or becomes severe.   An oral temperature above 102 F (38.9 C) develops.   The foot/ankle below the affected area becomes suddenly numb or the area feels weak and hard to move.  MAKE SURE YOU:    Understand these instructions.   Will watch your  condition.   Will get help right away if you are not doing well or get worse.  Document Released: 10/31/2006 Document Revised: 06/16/2011 Document Reviewed: 01/08/2007 [facility] Patient Information 2012 [place], [place].

## 2011-11-08 NOTE — Progress Notes (Signed)
Subjective:    Patient ID: [name], female    DOB: 1946-02-01, 66 y.o.   MRN: [id number]  HPI [name] presents for progressive pain in her legs from her feet to her thigh. This is not well controlled with gabapentin 300 mg bid. She was seen in consultation by Dr. Theodis Aguas at [facility] in October and she was diagnosed with small fiber neuropathy confirmed on tissue biopsy. In addition, she has a h/o fibromyalgia. 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  She has recently been treated for out of control hypertension and was started on amlodipine 5 mg and Benicar/hct 40/25. Since starting the amlodipine she has developed peripheral edema except that it will wax during the day and wane over night.   Past Medical History  Diagnosis Date  . Peripheral neuropathy   . Fibromyalgia   . Hyperlipemia   . Degenerative joint disease     cervical spine  . GERD (gastroesophageal reflux disease)   . Lumbar disc disease   . Hypertension   . Depression   . Carotid artery disease     Doppler, April, 2012,  0-39% R. ICA, 60-79% LICA... no significant change... frequency of Doppler moved to yearly because of stability  . Chest pain     Nuclear stress test 2006 normal  . Hx of colonoscopy   . Drug therapy     msucle aches with simvastatin, but tolerates 5mg  Creastor  . Migraines   . Small fiber neuropathy   . Obesity    Past Surgical History  Procedure Date  . Laparoscopic ovarian cystectomy 1960's  . Appendectomy 1961  . Shoulder surgery 2004    "frozen"; right  . Trigger finger release 1996    right thumb   Family History  Problem Relation Age of Onset  . Coronary artery disease Father   . Heart attack Father   . Heart disease Father     CAD/MI x 3, last fatal  . Diabetes Neg Hx   . Colon cancer Neg Hx   . Breast cancer Paternal Aunt   . Cancer Paternal Aunt     breast  cancer  . Stroke Mother   . Dementia Mother   . Heart disease Mother     chf  . Obesity Brother   . Hypertension Brother   . Cancer Paternal Aunt     breast cancer   History   Social History  . Marital Status: Married    Spouse Name: N/A    Number of Children: 0  . Years of Education: 13   Occupational History  .     Social History Main Topics  . Smoking status: Former Smoker -- 1.0 packs/day for 15 years    Types: Cigarettes    Quit date: 07/11/1996  . Smokeless tobacco: Never Used  . Alcohol Use: No  . Drug Use: No  . Sexually Active: No   Other Topics Concern  . Not on file   Social History Narrative   HSG, [employer] school, AA degree [employer]. Married '64- 21 years/divorced; married '98. No children. 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       Review of Systems System review is negative for any constitutional, cardiac, pulmonary, GI or neuro symptoms or complaints other than as described in the HPI.  Objective:   Physical Exam Filed Vitals:   11/08/11 1141  BP: 140/70  Pulse: 76  Temp: 98.3 F (36.8 C)  Resp: 16   Gen'l- WNWD white woman in no distress HEENT - C&S clear Cor- RRR Pulm - normal respirations Ext - trace edema at the ankles.        Assessment & Plan:

## 2011-11-09 NOTE — Assessment & Plan Note (Signed)
Pateint with severe pain  Plan Increase gabapentin step wise to 600 mg tid  Minimize use of norco  Return to Dr. Theodis Aguas as scheduled.

## 2011-11-09 NOTE — Assessment & Plan Note (Addendum)
Well established diagnosis and a source of pain. She is on gabapentin 300 mg bid and norco.  Plan - increase gabapentin  Consider adding cymbalta if needed for pain control

## 2011-11-10 DIAGNOSIS — L820 Inflamed seborrheic keratosis: Secondary | ICD-10-CM | POA: Diagnosis not present

## 2011-11-10 DIAGNOSIS — L570 Actinic keratosis: Secondary | ICD-10-CM | POA: Diagnosis not present

## 2011-11-10 DIAGNOSIS — L821 Other seborrheic keratosis: Secondary | ICD-10-CM | POA: Diagnosis not present

## 2011-11-10 DIAGNOSIS — L819 Disorder of pigmentation, unspecified: Secondary | ICD-10-CM | POA: Diagnosis not present

## 2011-11-15 ENCOUNTER — Telehealth: Payer: Self-pay

## 2011-11-15 MED ORDER — TEMAZEPAM 15 MG PO CAPS: 15.0000 mg | ORAL_CAPSULE | Freq: Every evening | ORAL | Status: DC | PRN

## 2011-11-15 NOTE — Telephone Encounter (Signed)
Spoke with patient concerning hemmorrhoids and med called to drug store. Hard copy of temazepam faxed

## 2011-11-15 NOTE — Telephone Encounter (Signed)
1. OK top refill temazepam 2. Hemorrhoids - internal  anusol supp 25 mg 1 pr rectum bid #12; external anusol 2.5% cream 30 g, apply bid. Usual care with stool softeners and sitz baths. 3. Limit on gaba noted

## 2011-11-15 NOTE — Telephone Encounter (Signed)
Pt called requesting refills of Temazepam to [facility]. Pt also says she has been experiencing rectal bleeding she believes it due to hemorrhoids. Pt is requesting Rx for suppositories to local pharmacy - [facility] [address]. Please advise.   Pt also says that she could not tolerate increasing gabapentin to anything over 1 300 mg BID.

## 2011-11-17 ENCOUNTER — Encounter: Payer: Self-pay | Admitting: Internal Medicine

## 2011-11-28 ENCOUNTER — Ambulatory Visit (INDEPENDENT_AMBULATORY_CARE_PROVIDER_SITE_OTHER): Payer: Medicare Other | Admitting: Internal Medicine

## 2011-11-28 ENCOUNTER — Encounter: Payer: Self-pay | Admitting: Internal Medicine

## 2011-11-28 VITALS — BP 140/72 | HR 76 | Temp 97.7°F | Resp 16 | Wt 169.0 lb

## 2011-11-28 DIAGNOSIS — G4700 Insomnia, unspecified: Secondary | ICD-10-CM

## 2011-11-28 DIAGNOSIS — I10 Essential (primary) hypertension: Secondary | ICD-10-CM

## 2011-11-28 DIAGNOSIS — J40 Bronchitis, not specified as acute or chronic: Secondary | ICD-10-CM

## 2011-11-28 MED ORDER — BENZONATATE 100 MG PO CAPS: 100.0000 mg | ORAL_CAPSULE | Freq: Three times a day (TID) | ORAL | Status: DC | PRN

## 2011-11-28 MED ORDER — HYDROCOD POLST-CHLORPHEN POLST 10-8 MG/5ML PO LQCR: 5.0000 mL | Freq: Two times a day (BID) | ORAL | Status: DC | PRN

## 2011-11-28 MED ORDER — AZITHROMYCIN 250 MG PO TABS: ORAL_TABLET | ORAL | Status: AC

## 2011-11-28 NOTE — Progress Notes (Signed)
Subjective:    Patient ID: [name], female    DOB: January 15, 1946, 66 y.o.   MRN: [id number]  Sore Throat  This is a new problem. The current episode started 1 to 4 weeks ago. The problem has been gradually improving. There has been no fever. Associated symptoms include congestion and coughing. Pertinent negatives include no abdominal pain or headaches.   she has a h/o fibromyalgia. 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  She has recently been treated for out of control hypertension and was started on amlodipine 5 mg and Benicar/hct 40/25. Since starting the amlodipine she has developed peripheral edema except that it will wax during the day and wane over night.   BP Readings from Last 3 Encounters:  11/28/11 140/72  11/08/11 140/70  10/12/11 140/78     Past Medical History  Diagnosis Date  . Peripheral neuropathy   . Fibromyalgia   . Hyperlipemia   . Degenerative joint disease     cervical spine  . GERD (gastroesophageal reflux disease)   . Lumbar disc disease   . Hypertension   . Depression   . Carotid artery disease     Doppler, April, 2012,  0-39% R. ICA, 60-79% LICA... no significant change... frequency of Doppler moved to yearly because of stability  . Chest pain     Nuclear stress test 2006 normal  . Hx of colonoscopy   . Drug therapy     msucle aches with simvastatin, but tolerates 5mg  Creastor  . Migraines   . Small fiber neuropathy   . Obesity    Past Surgical History  Procedure Date  . Laparoscopic ovarian cystectomy 1960's  . Appendectomy 1961  . Shoulder surgery 2004    "frozen"; right  . Trigger finger release 1996    right thumb   Family History  Problem Relation Age of Onset  . Coronary artery disease Father   . Heart attack Father   . Heart disease Father     CAD/MI x 3, last fatal  . Diabetes Neg Hx   . Colon cancer Neg Hx     . Breast cancer Paternal Aunt   . Cancer Paternal Aunt     breast cancer  . Stroke Mother   . Dementia Mother   . Heart disease Mother     chf  . Obesity Brother   . Hypertension Brother   . Cancer Paternal Aunt     breast cancer   History   Social History  . Marital Status: Married    Spouse Name: N/A    Number of Children: 0  . Years of Education: 13   Occupational History  .     Social History Main Topics  . Smoking status: Former Smoker -- 1.0 packs/day for 15 years    Types: Cigarettes    Quit date: 07/11/1996  . Smokeless tobacco: Never Used  . Alcohol Use: No  . Drug Use: No  . Sexually Active: No   Other Topics Concern  . Not on file   Social History Narrative   HSG, [employer]ology school, AA degree [employer]. Married '64- 21 years/divorced; married '98. No children. 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       Review of Systems  Constitutional: Positive for fatigue. Negative for chills, activity change, appetite  change and unexpected weight change.  HENT: Positive for congestion. Negative for mouth sores and sinus pressure.   Eyes: Negative for visual disturbance.  Respiratory: Positive for cough. Negative for chest tightness and wheezing.   Gastrointestinal: Negative for nausea and abdominal pain.  Genitourinary: Negative for frequency, difficulty urinating and vaginal pain.  Musculoskeletal: Negative for back pain and gait problem.  Skin: Negative for pallor and rash.  Neurological: Negative for dizziness, tremors, weakness, numbness and headaches.  Psychiatric/Behavioral: Negative for suicidal ideas, confusion and sleep disturbance.   System review is negative for any constitutional, cardiac, pulmonary, GI or neuro symptoms or complaints other than as described in the HPI.     Objective:   Physical Exam  Constitutional: She appears well-developed. No distress.       obese  HENT:  Head:  Normocephalic.  Right Ear: External ear normal.  Left Ear: External ear normal.  Nose: Nose normal.  Mouth/Throat: Oropharynx is clear and moist.       eryth throat  Eyes: Conjunctivae are normal. Pupils are equal, round, and reactive to light. Right eye exhibits no discharge. Left eye exhibits no discharge.  Neck: Normal range of motion. Neck supple. No JVD present. No tracheal deviation present. No thyromegaly present.  Cardiovascular: Normal rate, regular rhythm and normal heart sounds.   Pulmonary/Chest: No stridor. No respiratory distress. She has no wheezes.  Abdominal: Soft. Bowel sounds are normal. She exhibits no distension and no mass. There is no tenderness. There is no rebound and no guarding.  Musculoskeletal: She exhibits no edema and no tenderness.  Lymphadenopathy:    She has no cervical adenopathy.  Neurological: She displays normal reflexes. No cranial nerve deficit. She exhibits normal muscle tone. Coordination normal.  Skin: No rash noted. No erythema.  Psychiatric: She has a normal mood and affect. Her behavior is normal. Judgment and thought content normal.   Filed Vitals:   11/28/11 1550  BP: 140/72  Pulse: 76  Temp: 97.7 F (36.5 C)  Resp: 16          Assessment & Plan:

## 2011-11-28 NOTE — Assessment & Plan Note (Addendum)
Tussionex Zpac Tessalon CXR if not better

## 2011-11-28 NOTE — Assessment & Plan Note (Signed)
She has been using a sleep aid nightely

## 2011-11-28 NOTE — Assessment & Plan Note (Signed)
Worse lately Cont Rx Treat bronchitis F/u w/Dr Norins in 2 mo

## 2011-12-01 ENCOUNTER — Telehealth: Payer: Self-pay | Admitting: Cardiology

## 2011-12-01 NOTE — Telephone Encounter (Signed)
Today she has taken bp and had concerns, she has had bronchitis and on abx.  This am 133/81 p 71,  131/66 p 70  123/104 p 65 this was after trying to plant in her garden and feels exhausted and took bp right away Current 113/61 p 68  Told her that her BP is fine, she has bronchitis but her bp is ok, call with further questions or concerns. Pt agreed to plan.

## 2011-12-01 NOTE — Telephone Encounter (Signed)
Pt has had bronchitis for 3 weeks, and med seems to be effecting her BP , being low, feels tired,  123/104 bp reading at 340pm

## 2011-12-07 DIAGNOSIS — G609 Hereditary and idiopathic neuropathy, unspecified: Secondary | ICD-10-CM | POA: Diagnosis not present

## 2011-12-07 DIAGNOSIS — IMO0001 Reserved for inherently not codable concepts without codable children: Secondary | ICD-10-CM | POA: Diagnosis not present

## 2011-12-13 ENCOUNTER — Encounter: Payer: Self-pay | Admitting: Internal Medicine

## 2011-12-13 ENCOUNTER — Ambulatory Visit (INDEPENDENT_AMBULATORY_CARE_PROVIDER_SITE_OTHER): Payer: Medicare Other | Admitting: Internal Medicine

## 2011-12-13 VITALS — BP 110/60 | HR 68 | Temp 98.0°F | Resp 16 | Wt 168.0 lb

## 2011-12-13 DIAGNOSIS — R6889 Other general symptoms and signs: Secondary | ICD-10-CM | POA: Diagnosis not present

## 2011-12-13 DIAGNOSIS — R0789 Other chest pain: Secondary | ICD-10-CM

## 2011-12-13 NOTE — Patient Instructions (Signed)
Weakness - more of what I would describe as decreased exercise tolerance with shortness of breath. Lungs are clear on exam, oxygen level is normal. The concern is whether you are having symptoms that could be heart related.  Plan -  Continue all your present medications  Will schedule you for a 2 D echocardiogram to assess heart function and wall motion.  If the echo is normal and your symptoms continue we will get Dr. Myrtis Ser involved as to the best next step to evaluate you for any heart disease.  Celexa - sounds like you did a good job on the taper. I don't think the absence of celexa will explain your symptoms.  Blood pressure - blood pressure is great.

## 2011-12-15 NOTE — Progress Notes (Signed)
Subjective:    Patient ID: [name], female    DOB: [birth year], 66 y.o.   MRN: [id number]  HPI [name] was recently treated for bronchitis with a course of antibiotics. Her symptoms have clear although she continues to have a cough taht is productive of a clear mucus. She c/o discomfort across her upper anterior chest that does seem exertional. She is also have sweats in association with her discomfort. She denies any frank chest pain, excessive shortness of breath or pain that radiates to arm or jaw.  Cardiac Risk: post-menopausal, overweight, HTN, Lipids, PVD - 60-80 LICA stenosis on recent carotid doppler, sedantary, positive family history both parents.  Past Medical History  Diagnosis Date  . Peripheral neuropathy   . Fibromyalgia   . Hyperlipemia   . Degenerative joint disease     cervical spine  . GERD (gastroesophageal reflux disease)   . Lumbar disc disease   . Hypertension   . Depression   . Carotid artery disease     Doppler, April, 2012,  0-39% R. ICA, 60-79% LICA... no significant change... frequency of Doppler moved to yearly because of stability  . Chest pain     Nuclear stress test 2006 normal  . Hx of colonoscopy   . Drug therapy     msucle aches with simvastatin, but tolerates 5mg  Creastor  . Migraines   . Small fiber neuropathy   . Obesity    Past Surgical History  Procedure Date  . Laparoscopic ovarian cystectomy 1960's  . Appendectomy 1961  . Shoulder surgery 2004    "frozen"; right  . Trigger finger release 1996    right thumb   Family History  Problem Relation Age of Onset  . Coronary artery disease Father   . Heart attack Father   . Heart disease Father     CAD/MI x 3, last fatal  . Diabetes Neg Hx   . Colon cancer Neg Hx   . Breast cancer Paternal Aunt   . Cancer Paternal Aunt     breast cancer  . Stroke Mother   . Dementia Mother   . Heart disease Mother     chf  . Obesity Brother   . Hypertension Brother   . Cancer  Paternal Aunt     breast cancer   History   Social History  . Marital Status: Married    Spouse Name: N/A    Number of Children: 0  . Years of Education: 13   Occupational History  .     Social History Main Topics  . Smoking status: Former Smoker -- 1.0 packs/day for 15 years    Types: Cigarettes    Quit date: 07/11/1996  . Smokeless tobacco: Never Used  . Alcohol Use: No  . Drug Use: No  . Sexually Active: No   Other Topics Concern  . Not on file   Social History Narrative   HSG, [employer]ology school, AA degree [employer]. Married '64- 21 years/divorced; married '98. No children. 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       Review of Systems System review is negative for any constitutional, cardiac, pulmonary, GI or neuro symptoms or complaints other than as described in the HPI.     Objective:   Physical Exam Filed Vitals:   12/13/11 1544  BP: 110/60  Pulse: 68  Temp: 98 F (36.7 C)  Resp: 16   Weight: 168 lb (76.204 kg)  gen'l- overweight white woman in no distress HEENT - C&S clear Cor - RRR, 2+ radial pulse Pulm - normal respirations, no rales or wheeze Neuro - A&O x 3.        Assessment & Plan:  Atypical chest pain - Weakness - more of what I would describe as decreased exercise tolerance with shortness of breath. Lungs are clear on exam, oxygen level is normal. The concern is whether these are having symptoms that could be heart related.  Plan -  Continue all present medications  Will schedule a 2 D echocardiogram to assess heart function and wall motion.  If the echo is normal and symptoms continue we will get Dr. Myrtis Ser involved as to the best next step to evaluate you for any heart disease.

## 2011-12-20 ENCOUNTER — Ambulatory Visit (HOSPITAL_COMMUNITY): Payer: Medicare Other | Attending: Internal Medicine | Admitting: Radiology

## 2011-12-20 DIAGNOSIS — Z87891 Personal history of nicotine dependence: Secondary | ICD-10-CM | POA: Insufficient documentation

## 2011-12-20 DIAGNOSIS — E785 Hyperlipidemia, unspecified: Secondary | ICD-10-CM | POA: Diagnosis not present

## 2011-12-20 DIAGNOSIS — I679 Cerebrovascular disease, unspecified: Secondary | ICD-10-CM | POA: Diagnosis not present

## 2011-12-20 DIAGNOSIS — R6889 Other general symptoms and signs: Secondary | ICD-10-CM

## 2011-12-20 DIAGNOSIS — R0789 Other chest pain: Secondary | ICD-10-CM | POA: Diagnosis not present

## 2011-12-20 DIAGNOSIS — R5381 Other malaise: Secondary | ICD-10-CM | POA: Insufficient documentation

## 2011-12-20 DIAGNOSIS — I10 Essential (primary) hypertension: Secondary | ICD-10-CM | POA: Insufficient documentation

## 2011-12-20 DIAGNOSIS — Z8249 Family history of ischemic heart disease and other diseases of the circulatory system: Secondary | ICD-10-CM | POA: Diagnosis not present

## 2011-12-20 NOTE — Progress Notes (Signed)
Echocardiogram performed.  

## 2011-12-25 ENCOUNTER — Encounter: Payer: Self-pay | Admitting: Internal Medicine

## 2011-12-26 ENCOUNTER — Telehealth: Payer: Self-pay | Admitting: Cardiology

## 2011-12-26 NOTE — Telephone Encounter (Signed)
New msg Pt wants to know echo results  

## 2011-12-26 NOTE — Telephone Encounter (Signed)
N/A.  LMTC. 

## 2011-12-28 NOTE — Telephone Encounter (Signed)
Pt notified that she should contact Dr Debby Bud for results of echo since he ordered it.  She was also notified that he had mailed her a letter with the results.

## 2012-01-03 DIAGNOSIS — M5430 Sciatica, unspecified side: Secondary | ICD-10-CM | POA: Diagnosis not present

## 2012-01-03 DIAGNOSIS — M999 Biomechanical lesion, unspecified: Secondary | ICD-10-CM | POA: Diagnosis not present

## 2012-01-05 ENCOUNTER — Other Ambulatory Visit: Payer: Self-pay | Admitting: Internal Medicine

## 2012-01-15 ENCOUNTER — Encounter: Payer: Self-pay | Admitting: Cardiology

## 2012-01-15 DIAGNOSIS — R9430 Abnormal result of cardiovascular function study, unspecified: Secondary | ICD-10-CM | POA: Insufficient documentation

## 2012-01-16 ENCOUNTER — Ambulatory Visit (INDEPENDENT_AMBULATORY_CARE_PROVIDER_SITE_OTHER): Payer: Medicare Other | Admitting: Cardiology

## 2012-01-16 ENCOUNTER — Encounter: Payer: Self-pay | Admitting: Cardiology

## 2012-01-16 VITALS — BP 120/78 | HR 80 | Ht 62.0 in | Wt 168.0 lb

## 2012-01-16 DIAGNOSIS — R0989 Other specified symptoms and signs involving the circulatory and respiratory systems: Secondary | ICD-10-CM | POA: Diagnosis not present

## 2012-01-16 DIAGNOSIS — R5383 Other fatigue: Secondary | ICD-10-CM | POA: Diagnosis not present

## 2012-01-16 DIAGNOSIS — R5381 Other malaise: Secondary | ICD-10-CM

## 2012-01-16 DIAGNOSIS — I10 Essential (primary) hypertension: Secondary | ICD-10-CM

## 2012-01-16 DIAGNOSIS — I519 Heart disease, unspecified: Secondary | ICD-10-CM | POA: Diagnosis not present

## 2012-01-16 DIAGNOSIS — I5189 Other ill-defined heart diseases: Secondary | ICD-10-CM | POA: Insufficient documentation

## 2012-01-16 DIAGNOSIS — R9430 Abnormal result of cardiovascular function study, unspecified: Secondary | ICD-10-CM

## 2012-01-16 NOTE — Patient Instructions (Addendum)
Your physician wants you to follow-up in:  6 months. You will receive a reminder letter in the mail two months in advance. If you don't receive a letter, please call our office to schedule the follow-up appointment.   

## 2012-01-16 NOTE — Progress Notes (Signed)
Patient ID: [name], female   DOB: 20-Dec-1945, 66 y.o.   MRN: [id number]   HPI   The patient is seen today for followup fatigue and some chest discomfort. She also has back discomfort. I saw her last April, 2013. At that time I felt that she was stable. Her hypertension  Was being treated. Her lipids were being treated. Since then she has seen Dr. Debby Bud. She's had continued fatigue. He wanted to be sure that this was not a cardiac issue. She had a two-dimensional echo. Her ejection fraction is normal at 60-65%. The report is read as showing some diastolic dysfunction. I have fully discussed this with the patient.  As part of today's evaluation I have spent significant time discussing the results of the echo with the patient and her husband. I reviewed the echo report carefully.I explained systolic function and diastolic function. I also had a long discussion with her about her back pain. She also asked me about the use of nonsteroidal medications. She also asked if she was at increased risk with the use of erythromycin. We discussed all of these issues.   Allergies  Allergen Reactions  . Codeine Nausea And Vomiting  . Celebrex (Celecoxib) Rash    Current Outpatient Prescriptions  Medication Sig Dispense Refill  . aspirin 81 MG tablet Take 81 mg by mouth daily.       . benzonatate (TESSALON) 100 MG capsule Take 1 capsule (100 mg total) by mouth 3 (three) times daily as needed. For cough  60 capsule  0  . diltiazem (CARDIZEM CD) 180 MG 24 hr capsule TAKE 1 CAPSULE (180 MG TOTAL) BY MOUTH DAILY.  30 capsule  11  . fluocinonide gel (LIDEX) 0.05 % Apply 1 application topically 2 (two) times daily as needed. for oral ulcers       . fluticasone (FLONASE) 50 MCG/ACT nasal spray Place 2 sprays into the nose daily. As needed      . gabapentin (NEURONTIN) 300 MG capsule Take 1 capsule (300 mg total) by mouth 2 (two) times daily.  180 capsule  3  . HYDROcodone-acetaminophen (NORCO) 10-325 MG per  tablet Take 1 tablet by mouth every 6 (six) hours as needed. For pain  60 tablet  1  . meloxicam (MOBIC) 15 MG tablet Take 1 tablet (15 mg total) by mouth daily.  90 tablet  3  . methocarbamol (ROBAXIN) 500 MG tablet Take 500 mg by mouth 3 (three) times daily as needed. For pain/muscle spasms       . naratriptan (AMERGE) 1 MG TABS Take 1 tablet (1 mg total) by mouth once as needed. Take one (1) tablet at onset of headache; if returns or does not resolve, may repeat after 4 hours; do not exceed five (5) mg in 24 hours.  36 tablet  3  . olmesartan-hydrochlorothiazide (BENICAR HCT) 40-25 MG per tablet Take 1 tablet by mouth daily.  90 tablet  3  . omeprazole (PRILOSEC) 40 MG capsule Take 1 capsule (40 mg total) by mouth daily.  90 capsule  3  . rosuvastatin (CRESTOR) 5 MG tablet Take 5 mg by mouth every other day. Take 1 tablet mon,wed,and fri      . temazepam (RESTORIL) 15 MG capsule Take 1 capsule (15 mg total) by mouth at bedtime as needed. For sleep  30 capsule  1  . DISCONTD: diltiazem (DILACOR XR) 180 MG 24 hr capsule Take 1 capsule (180 mg total) by mouth daily.  30 capsule  1    History   Social History  . Marital Status: Married    Spouse Name: N/A    Number of Children: 0  . Years of Education: 13   Occupational History  .     Social History Main Topics  . Smoking status: Former Smoker -- 1.0 packs/day for 15 years    Types: Cigarettes    Quit date: 07/11/1996  . Smokeless tobacco: Never Used  . Alcohol Use: No  . Drug Use: No  . Sexually Active: No   Other Topics Concern  . Not on file   Social History Narrative   HSG, cosm[employer]ology school, AA degree [employer]. Married '64- 21 years/divorced; married '98. No children. 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    Family History  Problem Relation Age of Onset  . Coronary artery disease Father   . Heart attack Father   . Heart disease Father     CAD/MI x 3, last fatal  .  Diabetes Neg Hx   . Colon cancer Neg Hx   . Breast cancer Paternal Aunt   . Cancer Paternal Aunt     breast cancer  . Stroke Mother   . Dementia Mother   . Heart disease Mother     chf  . Obesity Brother   . Hypertension Brother   . Cancer Paternal Aunt     breast cancer    Past Medical History  Diagnosis Date  . Peripheral neuropathy   . Fibromyalgia   . Hyperlipemia   . Degenerative joint disease     cervical spine  . GERD (gastroesophageal reflux disease)   . Lumbar disc disease   . Hypertension   . Depression   . Carotid artery disease     Doppler, April, 2012,  0-39% R. ICA, 60-79% LICA... no significant change... frequency of Doppler moved to yearly because of stability  . Chest pain     Nuclear stress test 2006 normal  . Hx of colonoscopy   . Drug therapy     msucle aches with simvastatin, but tolerates 5mg  Creastor  . Migraines   . Small fiber neuropathy   . Obesity   . Ejection fraction     EF 60%, echo, December 20, 2011    Past Surgical History  Procedure Date  . Laparoscopic ovarian cystectomy 1960's  . Appendectomy 1961  . Shoulder surgery 2004    "frozen"; right  . Trigger finger release 1996    right thumb    ROS   Patient denies fever, chills, headache, sweats, rash, change in vision, change in hearing, cough, nausea vomiting, urinary symptoms. All other systems are reviewed and are negative.  PHYSICAL EXAM  Patient is oriented to person time and place. Affect is normal. There is no jugulovenous distention. Lungs are clear. Respiratory effort is not labored. Cardiac exam reveals S1 and S2. There no clicks or significant murmurs. Abdomen is soft. Is no peripheral edema.  Filed Vitals:   01/16/12 1016  BP: 120/78  Pulse: 80  Height: 5\' 2"  (1.575 m)  Weight: 168 lb (76.204 kg)     ASSESSMENT & PLAN

## 2012-01-16 NOTE — Assessment & Plan Note (Signed)
The patient has fatigue in general. I am not convinced that this is a sign of diastolic heart failure. No further cardiac workup.

## 2012-01-16 NOTE — Assessment & Plan Note (Signed)
The patient's echo report of June, 2013 raises the question of diastolic dysfunction. She may well have some. However there is no sign of diastolic heart failure. I feel that her current symptoms are not related to diastolic dysfunction. I do plan to see her for early followup to reassess as we move forward. I tried to be reassuring.

## 2012-01-16 NOTE — Assessment & Plan Note (Signed)
Blood pressure is treated. No change in therapy. 

## 2012-01-16 NOTE — Assessment & Plan Note (Signed)
The patient had a nuclear stress study in 2006 that was normal. Sometimes when she has back discomfort she has discomfort in her chest. It is not exertional. Systolic wall motion is normal. I've considered proceeding with a followup nuclear exercise test. Patient felt very poorly with this in the past and would prefer not to proceed. I am not giving she's having ischemic chest pain. She can be followed.

## 2012-01-17 ENCOUNTER — Ambulatory Visit: Payer: Medicare Other | Admitting: Internal Medicine

## 2012-02-01 DIAGNOSIS — M999 Biomechanical lesion, unspecified: Secondary | ICD-10-CM | POA: Diagnosis not present

## 2012-02-01 DIAGNOSIS — M5430 Sciatica, unspecified side: Secondary | ICD-10-CM | POA: Diagnosis not present

## 2012-02-15 DIAGNOSIS — D485 Neoplasm of uncertain behavior of skin: Secondary | ICD-10-CM | POA: Diagnosis not present

## 2012-02-15 DIAGNOSIS — L439 Lichen planus, unspecified: Secondary | ICD-10-CM | POA: Diagnosis not present

## 2012-02-16 ENCOUNTER — Telehealth: Payer: Self-pay | Admitting: Internal Medicine

## 2012-02-16 NOTE — Telephone Encounter (Signed)
Patient calling, wants to bring a urine specimen to the office Friday am.  Had pain on urination and frequency on Wednesday, no symptoms today.  She is concerned because they are going on vacation next week.   Again, no symptoms to triage.  Scheduled appointment at 11a Friday with Dr. Debby Bud at patient request.

## 2012-02-17 ENCOUNTER — Other Ambulatory Visit (INDEPENDENT_AMBULATORY_CARE_PROVIDER_SITE_OTHER): Payer: Medicare Other

## 2012-02-17 ENCOUNTER — Ambulatory Visit (INDEPENDENT_AMBULATORY_CARE_PROVIDER_SITE_OTHER): Payer: Medicare Other | Admitting: Internal Medicine

## 2012-02-17 ENCOUNTER — Telehealth: Payer: Self-pay | Admitting: Internal Medicine

## 2012-02-17 DIAGNOSIS — R300 Dysuria: Secondary | ICD-10-CM

## 2012-02-17 LAB — URINALYSIS, ROUTINE W REFLEX MICROSCOPIC
Bilirubin Urine: NEGATIVE
Ketones, ur: NEGATIVE
Nitrite: NEGATIVE
Specific Gravity, Urine: 1.01 (ref 1.000–1.030)
Urine Glucose: NEGATIVE
Urobilinogen, UA: 0.2 (ref 0.0–1.0)
pH: 6.5 (ref 5.0–8.0)

## 2012-02-17 MED ORDER — TEMAZEPAM 15 MG PO CAPS: 15.0000 mg | ORAL_CAPSULE | Freq: Every evening | ORAL | Status: DC | PRN

## 2012-02-17 MED ORDER — SULFAMETHOXAZOLE-TRIMETHOPRIM 800-160 MG PO TABS: 1.0000 | ORAL_TABLET | Freq: Two times a day (BID) | ORAL | Status: DC

## 2012-02-17 NOTE — Telephone Encounter (Signed)
Patient notified of UTI lab results from this AM. And medication called to [facility]. Also refill on tamazepam

## 2012-02-17 NOTE — Telephone Encounter (Signed)
Presented to the office with dysuria.  U/A - positive for infection. Plan - septra ds bid x 5 days, #10

## 2012-02-23 ENCOUNTER — Ambulatory Visit (INDEPENDENT_AMBULATORY_CARE_PROVIDER_SITE_OTHER): Payer: Medicare Other | Admitting: Internal Medicine

## 2012-02-23 ENCOUNTER — Telehealth: Payer: Self-pay | Admitting: Internal Medicine

## 2012-02-23 ENCOUNTER — Encounter: Payer: Self-pay | Admitting: Internal Medicine

## 2012-02-23 ENCOUNTER — Other Ambulatory Visit: Payer: Medicare Other

## 2012-02-23 VITALS — BP 112/68 | HR 87 | Temp 98.4°F | Resp 16 | Wt 171.0 lb

## 2012-02-23 DIAGNOSIS — N390 Urinary tract infection, site not specified: Secondary | ICD-10-CM

## 2012-02-23 LAB — POCT URINALYSIS DIPSTICK
Bilirubin, UA: NEGATIVE
Glucose, UA: NEGATIVE
Ketones, UA: NEGATIVE
Nitrite, UA: NEGATIVE
Protein, UA: NEGATIVE
Spec Grav, UA: 1.01
Urobilinogen, UA: NEGATIVE
pH, UA: 5

## 2012-02-23 MED ORDER — NITROFURANTOIN MONOHYD MACRO 100 MG PO CAPS: 100.0000 mg | ORAL_CAPSULE | Freq: Two times a day (BID) | ORAL | Status: AC

## 2012-02-23 NOTE — Telephone Encounter (Signed)
Caller: Julee/Patient; Patient Name: [name]; PCP: Illene Regulus; Best Callback Phone Number: [phone].  Has finished sulfa antibiotic for recent UTI, urine remains "milky" in color, feels like can not empty bladder, leaving for vacation 02/24/12.   All emergent sx for Urinary Sx Protocol Except for Evaluated by provider and no improvement in sx after treatment plan.  Appt scheduled 02/23/12 1115.

## 2012-02-23 NOTE — Progress Notes (Signed)
  Subjective:    Patient ID: [name], female    DOB: 12-06-1945, 66 y.o.   MRN: [id number]  HPI [name] had a positive U/A August 9th and was treated with Septra DS bid x 5 days. Her symptoms never did clear and have gotten worse over the past two days: frequency, urgency, incomplete emptying. She has not had any fever since August 9th. No frank hematuria. Dip U/A today 3+ LE  \PMH, FamHx and SocHx reviewed for any changes and relevance.      Review of Systems System review is negative for any constitutional, cardiac, pulmonary, GI or neuro symptoms or complaints other than as described in the HPI.     Objective:   Physical Exam Filed Vitals:   02/23/12 1142  BP: 112/68  Pulse: 87  Temp: 98.4 F (36.9 C)  Resp: 16   Gen'l WNWD white woman in no distress Cor- RRR PUlm - normal respirations Neuro - A&O, normal gait.       Assessment & Plan:  UTI - recurrent symptoms, actually persistent symptoms, despite treatment with septra DS bid x 5. Dip U/A positive  Plan Send urine for culture  Rx - Macrobid 100 mg bid x 10  Addendum: Urine culture - e. Coli, pan sensitive including nitrofurantoin

## 2012-02-26 LAB — URINE CULTURE: Colony Count: >=100000

## 2012-02-28 ENCOUNTER — Encounter: Payer: Self-pay | Admitting: Internal Medicine

## 2012-02-28 ENCOUNTER — Telehealth: Payer: Self-pay | Admitting: Internal Medicine

## 2012-02-28 ENCOUNTER — Ambulatory Visit (INDEPENDENT_AMBULATORY_CARE_PROVIDER_SITE_OTHER): Payer: Medicare Other | Admitting: Internal Medicine

## 2012-02-28 VITALS — BP 114/68 | HR 92 | Temp 98.8°F | Resp 16 | Wt 171.0 lb

## 2012-02-28 DIAGNOSIS — Z789 Other specified health status: Secondary | ICD-10-CM

## 2012-02-28 DIAGNOSIS — Z888 Allergy status to other drugs, medicaments and biological substances status: Secondary | ICD-10-CM | POA: Diagnosis not present

## 2012-02-28 MED ORDER — CIPROFLOXACIN HCL 250 MG PO TABS: 250.0000 mg | ORAL_TABLET | Freq: Two times a day (BID) | ORAL | Status: AC

## 2012-02-28 NOTE — Telephone Encounter (Signed)
Caller: [name]/Patient is calling with a question about fever and chills with generalized body aches.  Onset: 02/28/12.  Temp 100.5 po at 0300 and 0100.  On Nitrofurontoin BID since 02/23/12 for urine infection. No dysuria or frequency. Advised to see MD within 8 hours for fever after starting new medication within past 7 days per Fever Guideline.  Appointment scheduled for 1415 02/28/12 with Dr Debby Bud.

## 2012-02-28 NOTE — Progress Notes (Signed)
  Subjective:    Patient ID: [name], female    DOB: 1945/07/13, 66 y.o.   MRN: [id number]  HPI Mrs. yott was seen August 15th for persistent UTI symptoms after being on Septra. She was started on Macrodantin and had Urine sent for culture. She presents now with fever to 101, abdominal pain, Nausea. No vomiting or diarrhea. She has not felt well since starting on macrodantin. Urine culture and SS came back e.coli pan sensitive except Septra. Side effects off nitrofurantoin include all of her current symptoms!  PMH, FamHx and SocHx reviewed for any changes and relevance. Reviewed all meds    Review of Systems System review is negative for any constitutional, cardiac, pulmonary, GI or neuro symptoms or complaints other than as described in the HPI.     Objective:   Physical Exam Filed Vitals:   02/28/12 1506  BP: 114/68  Pulse: 92  Temp: 98.8 F (37.1 C)  Resp: 16   Gen'l- WNWD white woman HEENT - TMs normal, throat clear Nodes - negative Cor- RRR Pulm - CTAP Abd - soft, BS + , no guarding or rebound.       Assessment & Plan:  Drug intolerance - her symptoms are c/w nitrofurantoin intolerance, exam is normal.  Plan  Stop macrobid  Take cipro 250 mg bid x 4 days to complete treatment of UTI  Call for persistent fevers.

## 2012-03-08 ENCOUNTER — Other Ambulatory Visit: Payer: Self-pay | Admitting: Internal Medicine

## 2012-03-08 ENCOUNTER — Telehealth: Payer: Self-pay | Admitting: Internal Medicine

## 2012-03-08 ENCOUNTER — Other Ambulatory Visit: Payer: Medicare Other

## 2012-03-08 DIAGNOSIS — R5381 Other malaise: Secondary | ICD-10-CM | POA: Diagnosis not present

## 2012-03-08 DIAGNOSIS — R5383 Other fatigue: Secondary | ICD-10-CM

## 2012-03-08 NOTE — Telephone Encounter (Signed)
Pt advised.

## 2012-03-08 NOTE — Telephone Encounter (Signed)
OK for lyme titre.

## 2012-03-08 NOTE — Telephone Encounter (Signed)
Caller: Delphina/Patient; Patient Name: [name]; PCP: Illene Regulus (Adults only); Best Callback Phone Number: [phone]; Had a UTI that was treated by 2 antibiotics before; UTI is resolved but is having extreme fatigue.  Has had a tick bite earlier in the summer--not able to function without having to sit down frequently.  Even to shower; afebrile; Intake/Elimination is normal for patient. Patient is requesting blood work and Lyme Disease screening. Triaged using Fatigue with a disposition to see provider within 24 hours due to geriatric person and recent onset of decreased ability to carry out ADLs or general decrease in exercise tolerance. Care advice given.   OFFICE:  PLEASE FOLLOW UP WITH APPOINTMENT.  PATIENT IS SEE IN 24 DUE TO EXTREME FATIGUE. DR. Debby Bud IS NOT IN TOMORROW 03/09/12. HAS A 16:30 SLOT BUT FOR ACUTE. SHE HAS BEEN FIGHTING HEALTH ISSUES FOR PAST THREE WEEKS.  THANK YOU.

## 2012-03-09 LAB — B. BURGDORFI ANTIBODIES: B burgdorferi Ab IgG+IgM: 0.19 {ISR}

## 2012-03-13 ENCOUNTER — Telehealth: Payer: Self-pay | Admitting: *Deleted

## 2012-03-13 NOTE — Telephone Encounter (Signed)
Message copied by [name] on Tue Mar 13, 2012  4:22 PM ------      Message from: Jacques Navy      Created: Sun Mar 11, 2012  7:43 AM       Call patient - blood test for Lyme's disease was negative

## 2012-03-13 NOTE — Telephone Encounter (Signed)
patient notified of negative blood test result for lyme disease

## 2012-03-22 ENCOUNTER — Encounter: Payer: Self-pay | Admitting: Internal Medicine

## 2012-03-22 ENCOUNTER — Other Ambulatory Visit (INDEPENDENT_AMBULATORY_CARE_PROVIDER_SITE_OTHER): Payer: Medicare Other

## 2012-03-22 ENCOUNTER — Ambulatory Visit (INDEPENDENT_AMBULATORY_CARE_PROVIDER_SITE_OTHER): Payer: Medicare Other | Admitting: Internal Medicine

## 2012-03-22 VITALS — BP 132/70 | HR 83 | Temp 98.5°F | Resp 16 | Wt 168.0 lb

## 2012-03-22 DIAGNOSIS — Z0000 Encounter for general adult medical examination without abnormal findings: Secondary | ICD-10-CM

## 2012-03-22 DIAGNOSIS — E785 Hyperlipidemia, unspecified: Secondary | ICD-10-CM

## 2012-03-22 DIAGNOSIS — IMO0001 Reserved for inherently not codable concepts without codable children: Secondary | ICD-10-CM

## 2012-03-22 DIAGNOSIS — G609 Hereditary and idiopathic neuropathy, unspecified: Secondary | ICD-10-CM

## 2012-03-22 DIAGNOSIS — G4700 Insomnia, unspecified: Secondary | ICD-10-CM

## 2012-03-22 DIAGNOSIS — R5383 Other fatigue: Secondary | ICD-10-CM | POA: Diagnosis not present

## 2012-03-22 DIAGNOSIS — H9319 Tinnitus, unspecified ear: Secondary | ICD-10-CM

## 2012-03-22 DIAGNOSIS — Z23 Encounter for immunization: Secondary | ICD-10-CM

## 2012-03-22 DIAGNOSIS — M999 Biomechanical lesion, unspecified: Secondary | ICD-10-CM | POA: Diagnosis not present

## 2012-03-22 DIAGNOSIS — R5381 Other malaise: Secondary | ICD-10-CM

## 2012-03-22 DIAGNOSIS — F3289 Other specified depressive episodes: Secondary | ICD-10-CM

## 2012-03-22 DIAGNOSIS — I10 Essential (primary) hypertension: Secondary | ICD-10-CM

## 2012-03-22 DIAGNOSIS — M5430 Sciatica, unspecified side: Secondary | ICD-10-CM | POA: Diagnosis not present

## 2012-03-22 DIAGNOSIS — F329 Major depressive disorder, single episode, unspecified: Secondary | ICD-10-CM

## 2012-03-22 LAB — LIPID PANEL
Cholesterol: 175 mg/dL (ref 0–200)
HDL: 59.8 mg/dL (ref >39.00)
LDL Cholesterol: 94 mg/dL (ref 0–99)
Total CHOL/HDL Ratio: 3
Triglycerides: 105 mg/dL (ref 0.0–149.0)
VLDL: 21 mg/dL (ref 0.0–40.0)

## 2012-03-22 LAB — TSH: TSH: 1.48 u[IU]/mL (ref 0.35–5.50)

## 2012-03-22 MED ORDER — OMEPRAZOLE 40 MG PO CPDR: 40.0000 mg | DELAYED_RELEASE_CAPSULE | Freq: Every day | ORAL | Status: DC

## 2012-03-22 MED ORDER — GABAPENTIN 300 MG PO CAPS: 300.0000 mg | ORAL_CAPSULE | Freq: Two times a day (BID) | ORAL | Status: DC

## 2012-03-22 MED ORDER — CLOTRIMAZOLE-BETAMETHASONE 1-0.05 % EX CREA: TOPICAL_CREAM | Freq: Two times a day (BID) | CUTANEOUS | Status: DC

## 2012-03-22 MED ORDER — MIRTAZAPINE 30 MG PO TABS: 30.0000 mg | ORAL_TABLET | Freq: Every day | ORAL | Status: DC

## 2012-03-22 MED ORDER — MELOXICAM 15 MG PO TABS: 15.0000 mg | ORAL_TABLET | Freq: Every day | ORAL | Status: DC

## 2012-03-22 MED ORDER — CITALOPRAM HYDROBROMIDE 20 MG PO TABS: 20.0000 mg | ORAL_TABLET | Freq: Every day | ORAL | Status: AC

## 2012-03-22 MED ORDER — ROSUVASTATIN CALCIUM 5 MG PO TABS: 5.0000 mg | ORAL_TABLET | ORAL | Status: DC

## 2012-03-22 NOTE — Progress Notes (Signed)
Subjective:    Patient ID: [name], female    DOB: Mar 26, 1946, 66 y.o.   MRN: [id number]  HPI The patient is here for annual Medicare wellness examination and management of other chronic and acute problems.  Recent problem with UTI and then a drug reaction to nitrofurantoin.   She has a high level of emotionality. She has a poor sleep. Also is very emotionally fragile - question of unresolved grief.    The risk factors are reflected in the social history.  The roster of all physicians providing medical care to patient - is listed in the Snapshot section of the chart.  Activities of daily living:  The patient is 100% inedpendent in all ADLs: dressing, toileting, feeding as well as independent mobility  Home safety : The patient has smoke detectors in the home. They wear seatbelts.  firearms are present in the home, kept in a safe fashion. There is no violence in the home.   There is no risks for hepatitis, STDs or HIV. There is no   history of blood transfusion. They have no travel history to infectious disease endemic areas of the world.  The patient has seen their dentist in the last six month. They have  seen their eye doctor in the last year. They admit to tinnitus and  hearing difficulty left year and have not had audiologic testing in the last year.    They do not  have excessive sun exposure. Discussed the need for sun protection: hats, long sleeves and use of sunscreen if there is significant sun exposure.   Diet: the importance of a healthy diet is discussed. They do have a high fat/fast food/eat out diet which does include veggies.  The patient has no regular exercise program.  The benefits of regular aerobic exercise were discussed and encouraged her to resume water exercise.  Depression screen: there are  signs and vegative symptoms of depression- irritability, change in appetite, anhedonia, sadness/tearfullness.  Cognitive assessment: the patient manages all their  financial and personal affairs and is actively engaged.    During the course of the visit the patient was educated and counseled about appropriate screening and preventive services including : fall prevention , diabetes screening, nutrition counseling, colorectal cancer screening, and recommended immunizations.  Past Medical History  Diagnosis Date  . Peripheral neuropathy   . Fibromyalgia   . Hyperlipemia   . Degenerative joint disease     cervical spine  . GERD (gastroesophageal reflux disease)   . Lumbar disc disease   . Hypertension   . Depression   . Carotid artery disease     Doppler, April, 2012,  0-39% R. ICA, 60-79% LICA... no significant change... frequency of Doppler moved to yearly because of stability  . Chest pain     Nuclear stress test 2006 normal  . Hx of colonoscopy   . Drug therapy     msucle aches with simvastatin, but tolerates 5mg  Creastor  . Migraines   . Small fiber neuropathy   . Obesity   . Ejection fraction     EF 60%, echo, December 20, 2011  . Fatigue     June, 2013  . Diastolic dysfunction     Echo report, June, 2013   Past Surgical History  Procedure Date  . Laparoscopic ovarian cystectomy 1960's  . Appendectomy 1961  . Shoulder surgery 2004    "frozen"; right  . Trigger finger release 1996    right thumb   Family History  Problem Relation Age of Onset  . Coronary artery disease Father   . Heart attack Father   . Heart disease Father     CAD/MI x 3, last fatal  . Diabetes Neg Hx   . Colon cancer Neg Hx   . Breast cancer Paternal Aunt   . Cancer Paternal Aunt     breast cancer  . Stroke Mother   . Dementia Mother   . Heart disease Mother     chf  . Obesity Brother   . Hypertension Brother   . Cancer Paternal Aunt     breast cancer   History   Social History  . Marital Status: Married    Spouse Name: N/A    Number of Children: 0  . Years of Education: 13   Occupational History  .     Social History Main Topics  .  Smoking status: Former Smoker -- 1.0 packs/day for 15 years    Types: Cigarettes    Quit date: 07/11/1996  . Smokeless tobacco: Never Used  . Alcohol Use: No  . Drug Use: No  . Sexually Active: No   Other Topics Concern  . Not on file   Social History Narrative   HSG, [employer] school, AA degree [employer]. Married '64- 21 years/divorced; married '98. No children. 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      Review of Systems  Constitutional: Positive for fever and fatigue. Negative for activity change.  HENT: Positive for hearing loss, congestion, neck pain and tinnitus. Negative for sore throat and mouth sores.   Cardiovascular: Negative.   Gastrointestinal: Negative.   Musculoskeletal: Positive for myalgias and arthralgias. Negative for gait problem.  Skin: Negative.   Hematological: Negative.   Psychiatric/Behavioral: Positive for disturbed wake/sleep cycle and dysphoric mood. Negative for confusion, decreased concentration and agitation.   .    Objective:   Physical Exam Filed Vitals:   03/22/12 0915  BP: 132/70  Pulse: 83  Temp: 98.5 F (36.9 C)  Resp: 16   Wt Readings from Last 3 Encounters:  03/22/12 168 lb (76.204 kg)  02/28/12 171 lb (77.565 kg)  02/23/12 171 lb (77.565 kg)   Gen'l: well nourished, well developed white woman in no distress HEENT - [place]/AT, EACs/TMs normal, oropharynx with native dentition in good condition, no buccal or palatal lesions, posterior pharynx clear, mucous membranes moist. C&S clear, PERRLA, fundi - normal Neck - supple, no thyromegaly Nodes- negative submental, cervical, supraclavicular regions Chest - no deformity, no CVAT Lungs - cleat without rales, wheezes. No increased work of breathing Breast - - Skin normal, nipples w/o discharge, no fixed mass or lesion, fibrocystic changes noted,  no axillary adenopathy. Cardiovascular - regular rate and rhythm, quiet precordium, no  murmurs, rubs or gallops, 2+ radial, DP and PT pulses Abdomen - BS+ x 4, no HSM, no guarding or rebound or tenderness Pelvic - deferred -aged out Rectal - deferred  Extremities - no clubbing, cyanosis, edema or deformity.  Neuro - A&O x 3, CN II-XII normal, motor strength normal and equal, DTRs 2+ and symmetrical biceps, radial, and patellar tendons. Cerebellar - no tremor, no rigidity, fluid movement and normal gait. Depression - irritability, poor sleep, anhedonia, decreased libido, fatigue, emotionality/tearfull. Derm - Head, neck, back, abdomen and extremities without suspicious lesions  Lab Results  Component Value Date   WBC 4.7 07/15/2011   HGB 12.8 07/15/2011   HCT 38.7 07/15/2011   PLT  240 07/15/2011   GLUCOSE 76 08/16/2011   CHOL 175 03/22/2012   TRIG 105.0 03/22/2012   HDL 59.80 03/22/2012   LDLCALC 94 03/22/2012   ALT 19 07/14/2011   AST 26 07/14/2011   NA 139 08/16/2011   K 4.0 08/16/2011   CL 104 08/16/2011   CREATININE 0.9 08/16/2011   BUN 20 08/16/2011   CO2 30 08/16/2011   TSH 1.48 03/22/2012         Assessment & Plan:

## 2012-03-22 NOTE — Patient Instructions (Addendum)
Sleep is a learned or unlearned behavior. 5 principles of sleep hygiene - 1) regular hour to retire and rise 7days/wk 2) no stimulants - caffeine, chocolat, alcohol, 3) regular exercise  - every afternoon  4) sleep sanctuary - a space that is right light, temperature, sound level, good bed where all you do is sleep. 5) No extinction behaviors, e.g. Laying in bed awake doing anything but sleeping. This means if you have a bad night - no naps, etc  Depression and unresolved grief/loss - this is a crucial issue that is a major contributor to sleep disorder, fibromyalgia pain and fatigue. Please Call Victorino Dike at [facility] to schedule an appointment with Judithe Modest, MSW  Full report of today's visit and lab will follow

## 2012-03-23 NOTE — Assessment & Plan Note (Signed)
Continued insomnia. Interestingly she dates the on-set to the night her mother died. Reviewed sleep hygiene.  Plan  Counseling  Sleep hygiene practice  Remeron 50 mg qHS

## 2012-03-23 NOTE — Assessment & Plan Note (Signed)
Seems that her discomfort is progressive.  Plan - no change in medication at this time.

## 2012-03-23 NOTE — Assessment & Plan Note (Signed)
Interval history - significant for several minor but bothersome infections. No major illness, surgery or injury. Limited physical exam is normal. She is current with colorectal cancer screening and breast cancer screening. Immunizations are up to date.   In summary - a very nice woman who is medically stable. She is strongly encouraged to under take short term counseling around issues of grief and loss and depression. She will return in 2-3 months for follow up.

## 2012-03-23 NOTE — Assessment & Plan Note (Signed)
LDL is better than goal of 100 or less and HDL is better than goal of 40+.  Plan-  Continue present medications and healthy diet.

## 2012-03-23 NOTE — Assessment & Plan Note (Signed)
Multi-factorial: insomnia, stress and depression.  Plan- see problems above.

## 2012-03-23 NOTE — Assessment & Plan Note (Signed)
She continues to have truncal and back tenderness on exam. She is on Paxil and gabapentin  Plan - continue present regimen.

## 2012-03-23 NOTE — Assessment & Plan Note (Signed)
Persistent but stable

## 2012-03-23 NOTE — Assessment & Plan Note (Signed)
Patient with several vegative signs of depression. Although her mother passed away several years ago this still seems to be an emotional charged raw spot  Plan Continue paxil, add remeron at bedtime  Referred to Judithe Modest

## 2012-03-23 NOTE — Assessment & Plan Note (Signed)
BP Readings from Last 3 Encounters:  03/22/12 132/70  02/28/12 114/68  02/23/12 112/68   Good control on present medications - no changes needed.

## 2012-03-26 ENCOUNTER — Ambulatory Visit (INDEPENDENT_AMBULATORY_CARE_PROVIDER_SITE_OTHER): Payer: Medicare Other | Admitting: Licensed Clinical Social Worker

## 2012-03-26 DIAGNOSIS — F4323 Adjustment disorder with mixed anxiety and depressed mood: Secondary | ICD-10-CM

## 2012-03-30 ENCOUNTER — Other Ambulatory Visit: Payer: Self-pay | Admitting: *Deleted

## 2012-03-30 ENCOUNTER — Telehealth: Payer: Self-pay | Admitting: Internal Medicine

## 2012-03-30 MED ORDER — ROSUVASTATIN CALCIUM 5 MG PO TABS: 5.0000 mg | ORAL_TABLET | ORAL | Status: DC

## 2012-03-30 NOTE — Telephone Encounter (Signed)
REFILL ON CRESTOR TO [facility]

## 2012-03-30 NOTE — Telephone Encounter (Signed)
Caller: Karel/Patient; Phone: [phone]; Reason for Call: Patient calling to get a new prescription for Crestor sent [facility].  States Dr.  Debby Bud sent a new prescription on 9/12 but the Pharmacy was needing more information so they cancelled the request.  Also patient wants to know if Dr.  Debby Bud could call in about 5 pills to [facility] so she will have enough until she receives her medication in the mail.  Please call her back.  Thanks

## 2012-04-09 ENCOUNTER — Ambulatory Visit: Payer: Medicare Other | Admitting: Licensed Clinical Social Worker

## 2012-04-11 DIAGNOSIS — M999 Biomechanical lesion, unspecified: Secondary | ICD-10-CM | POA: Diagnosis not present

## 2012-04-11 DIAGNOSIS — M5430 Sciatica, unspecified side: Secondary | ICD-10-CM | POA: Diagnosis not present

## 2012-04-23 DIAGNOSIS — M5430 Sciatica, unspecified side: Secondary | ICD-10-CM | POA: Diagnosis not present

## 2012-04-23 DIAGNOSIS — M999 Biomechanical lesion, unspecified: Secondary | ICD-10-CM | POA: Diagnosis not present

## 2012-04-25 DIAGNOSIS — M999 Biomechanical lesion, unspecified: Secondary | ICD-10-CM | POA: Diagnosis not present

## 2012-04-25 DIAGNOSIS — M5430 Sciatica, unspecified side: Secondary | ICD-10-CM | POA: Diagnosis not present

## 2012-05-02 DIAGNOSIS — M999 Biomechanical lesion, unspecified: Secondary | ICD-10-CM | POA: Diagnosis not present

## 2012-05-02 DIAGNOSIS — M5430 Sciatica, unspecified side: Secondary | ICD-10-CM | POA: Diagnosis not present

## 2012-05-09 DIAGNOSIS — M999 Biomechanical lesion, unspecified: Secondary | ICD-10-CM | POA: Diagnosis not present

## 2012-05-09 DIAGNOSIS — M5430 Sciatica, unspecified side: Secondary | ICD-10-CM | POA: Diagnosis not present

## 2012-05-23 ENCOUNTER — Other Ambulatory Visit: Payer: Self-pay | Admitting: Internal Medicine

## 2012-05-23 DIAGNOSIS — M5430 Sciatica, unspecified side: Secondary | ICD-10-CM | POA: Diagnosis not present

## 2012-05-23 DIAGNOSIS — M999 Biomechanical lesion, unspecified: Secondary | ICD-10-CM | POA: Diagnosis not present

## 2012-05-26 ENCOUNTER — Encounter (HOSPITAL_COMMUNITY): Payer: Self-pay | Admitting: Emergency Medicine

## 2012-05-26 ENCOUNTER — Emergency Department (HOSPITAL_COMMUNITY): Payer: Medicare Other

## 2012-05-26 ENCOUNTER — Emergency Department (HOSPITAL_COMMUNITY)
Admission: EM | Admit: 2012-05-26 | Discharge: 2012-05-26 | Disposition: A | Discharge status: Home / Self Care | Payer: Medicare Other | Attending: Emergency Medicine | Admitting: Emergency Medicine

## 2012-05-26 DIAGNOSIS — E785 Hyperlipidemia, unspecified: Secondary | ICD-10-CM | POA: Insufficient documentation

## 2012-05-26 DIAGNOSIS — Z8739 Personal history of other diseases of the musculoskeletal system and connective tissue: Secondary | ICD-10-CM | POA: Diagnosis not present

## 2012-05-26 DIAGNOSIS — K219 Gastro-esophageal reflux disease without esophagitis: Secondary | ICD-10-CM | POA: Diagnosis not present

## 2012-05-26 DIAGNOSIS — R109 Unspecified abdominal pain: Secondary | ICD-10-CM | POA: Insufficient documentation

## 2012-05-26 DIAGNOSIS — F3289 Other specified depressive episodes: Secondary | ICD-10-CM | POA: Insufficient documentation

## 2012-05-26 DIAGNOSIS — R079 Chest pain, unspecified: Secondary | ICD-10-CM | POA: Diagnosis not present

## 2012-05-26 DIAGNOSIS — I10 Essential (primary) hypertension: Secondary | ICD-10-CM | POA: Diagnosis not present

## 2012-05-26 DIAGNOSIS — F329 Major depressive disorder, single episode, unspecified: Secondary | ICD-10-CM | POA: Insufficient documentation

## 2012-05-26 DIAGNOSIS — Z8669 Personal history of other diseases of the nervous system and sense organs: Secondary | ICD-10-CM | POA: Insufficient documentation

## 2012-05-26 DIAGNOSIS — E669 Obesity, unspecified: Secondary | ICD-10-CM | POA: Diagnosis not present

## 2012-05-26 DIAGNOSIS — R0789 Other chest pain: Secondary | ICD-10-CM | POA: Diagnosis not present

## 2012-05-26 DIAGNOSIS — I2510 Atherosclerotic heart disease of native coronary artery without angina pectoris: Secondary | ICD-10-CM | POA: Diagnosis not present

## 2012-05-26 DIAGNOSIS — Z8679 Personal history of other diseases of the circulatory system: Secondary | ICD-10-CM | POA: Diagnosis not present

## 2012-05-26 DIAGNOSIS — N838 Other noninflammatory disorders of ovary, fallopian tube and broad ligament: Secondary | ICD-10-CM | POA: Diagnosis not present

## 2012-05-26 DIAGNOSIS — R748 Abnormal levels of other serum enzymes: Secondary | ICD-10-CM | POA: Insufficient documentation

## 2012-05-26 LAB — BASIC METABOLIC PANEL
BUN: 16 mg/dL (ref 6–23)
CO2: 24 mEq/L (ref 19–32)
Calcium: 9.5 mg/dL (ref 8.4–10.5)
Chloride: 103 mEq/L (ref 96–112)
Creatinine, Ser: 0.97 mg/dL (ref 0.50–1.10)
GFR calc Af Amer: 69 mL/min — ABNORMAL LOW (ref >90)
GFR calc non Af Amer: 59 mL/min — ABNORMAL LOW (ref >90)
Glucose, Bld: 109 mg/dL — ABNORMAL HIGH (ref 70–99)
Potassium: 3.7 mEq/L (ref 3.5–5.1)
Sodium: 138 mEq/L (ref 135–145)

## 2012-05-26 LAB — CBC
HCT: 37.8 % (ref 36.0–46.0)
Hemoglobin: 12.8 g/dL (ref 12.0–15.0)
MCH: 30 pg (ref 26.0–34.0)
MCHC: 33.9 g/dL (ref 30.0–36.0)
MCV: 88.7 fL (ref 78.0–100.0)
Platelets: 221 10*3/uL (ref 150–400)
RBC: 4.26 MIL/uL (ref 3.87–5.11)
RDW: 13.2 % (ref 11.5–15.5)
WBC: 6.8 10*3/uL (ref 4.0–10.5)

## 2012-05-26 LAB — HEPATIC FUNCTION PANEL
ALT: 16 U/L (ref 0–35)
AST: 23 U/L (ref 0–37)
Albumin: 3.7 g/dL (ref 3.5–5.2)
Alkaline Phosphatase: 76 U/L (ref 39–117)
Bilirubin, Direct: <0.1 mg/dL (ref 0.0–0.3)
Total Bilirubin: 0.2 mg/dL — ABNORMAL LOW (ref 0.3–1.2)
Total Protein: 6.6 g/dL (ref 6.0–8.3)

## 2012-05-26 LAB — POCT I-STAT TROPONIN I: Troponin i, poc: 0 ng/mL (ref 0.00–0.08)

## 2012-05-26 LAB — LIPASE, BLOOD: Lipase: 60 U/L — ABNORMAL HIGH (ref 11–59)

## 2012-05-26 LAB — TROPONIN I: Troponin I: <0.3 ng/mL (ref <0.30)

## 2012-05-26 LAB — PROTIME-INR
INR: 0.91 (ref 0.00–1.49)
Prothrombin Time: 12.2 seconds (ref 11.6–15.2)

## 2012-05-26 MED ORDER — IOHEXOL 300 MG/ML  SOLN
100.0000 mL | Freq: Once | INTRAMUSCULAR | Status: AC | PRN
  Administered 2012-05-26: 100 mL via INTRAVENOUS

## 2012-05-26 NOTE — ED Notes (Signed)
Patient transported to CT 

## 2012-05-26 NOTE — ED Notes (Addendum)
Left sided chest pain started today- over left breast- left arm achy -- states also has had increased indigestion- eating tums frequently in past 2 weeks- hx hiatal hernia- alert, oriented x 3, w/d, states pain is a "2" on 10 scale

## 2012-05-26 NOTE — ED Provider Notes (Signed)
History     CSN: [id number]  Arrival date & time 05/26/12  1539   First MD Initiated Contact with Patient 05/26/12 1631      Chief Complaint  Patient presents with  . Chest Pain    (Consider location/radiation/quality/duration/timing/severity/associated sxs/prior treatment) HPI The patient presents with chest pain.  She notes over the past weeks to one month she has had pain across her lower chest, bilaterally.  The pain has been dull.  Over the past day the pain has become more prominent.  The pain is focally in the lower chest, slightly left of center.  No clear exacerbating or alleviating factors.  The pain is nonpleuritic, or exertional.  She denies concurrent lightheadedness, nausea, vomiting, anorexia, diarrhea.  She does have a history of indigestion, currently takes PPI. Past Medical History  Diagnosis Date  . Peripheral neuropathy   . Fibromyalgia   . Hyperlipemia   . Degenerative joint disease     cervical spine  . GERD (gastroesophageal reflux disease)   . Lumbar disc disease   . Hypertension   . Depression   . Carotid artery disease     Doppler, April, 2012,  0-39% R. ICA, 60-79% LICA... no significant change... frequency of Doppler moved to yearly because of stability  . Chest pain     Nuclear stress test 2006 normal  . Hx of colonoscopy   . Drug therapy     msucle aches with simvastatin, but tolerates 5mg  Creastor  . Migraines   . Small fiber neuropathy   . Obesity   . Ejection fraction     EF 60%, echo, December 20, 2011  . Fatigue     June, 2013  . Diastolic dysfunction     Echo report, June, 2013    Past Surgical History  Procedure Date  . Laparoscopic ovarian cystectomy 1960's  . Appendectomy 1961  . Shoulder surgery 2004    "frozen"; right  . Trigger finger release 1996    right thumb    Family History  Problem Relation Age of Onset  . Coronary artery disease Father   . Heart attack Father   . Heart disease Father     CAD/MI x 3, last fatal   . Diabetes Neg Hx   . Colon cancer Neg Hx   . Breast cancer Paternal Aunt   . Cancer Paternal Aunt     breast cancer  . Stroke Mother   . Dementia Mother   . Heart disease Mother     chf  . Obesity Brother   . Hypertension Brother   . Cancer Paternal Aunt     breast cancer    History  Substance Use Topics  . Smoking status: Former Smoker -- 1.0 packs/day for 15 years    Types: Cigarettes    Quit date: 07/11/1996  . Smokeless tobacco: Never Used  . Alcohol Use: No    OB History    Grav Para Term Preterm Abortions TAB SAB Ect Mult Living                  Review of Systems  Constitutional:       Per HPI, otherwise negative  HENT:       Per HPI, otherwise negative  Eyes: Negative.   Respiratory:       Per HPI, otherwise negative  Cardiovascular:       Per HPI, otherwise negative  Gastrointestinal: Negative for vomiting.  Genitourinary: Negative.   Musculoskeletal:  Per HPI, otherwise negative  Skin: Negative.   Neurological: Negative for syncope.    Allergies  Codeine; Nitrofuran derivatives; and Celebrex  Home Medications   Current Outpatient Rx  Name  Route  Sig  Dispense  Refill  . ASPIRIN 81 MG PO TABS   Oral   Take 81 mg by mouth daily.          Marland Kitchen CITALOPRAM HYDROBROMIDE 20 MG PO TABS   Oral   Take 1 tablet (20 mg total) by mouth daily.   90 tablet   3   . DILTIAZEM HCL ER COATED BEADS 180 MG PO CP24   Oral   Take 180 mg by mouth daily.         Marland Kitchen FLUOCINONIDE 0.05 % EX GEL   Topical   Apply 1 application topically 2 (two) times daily as needed. for oral ulcers          . GABAPENTIN 300 MG PO CAPS   Oral   Take 1 capsule (300 mg total) by mouth 2 (two) times daily.   180 capsule   3   . HYDROCODONE-ACETAMINOPHEN 10-325 MG PO TABS   Oral   Take 1 tablet by mouth every 6 (six) hours as needed. For pain   60 tablet   1   . MELOXICAM 15 MG PO TABS   Oral   Take 1 tablet (15 mg total) by mouth daily.   90 tablet   3     . ADULT MULTIVITAMIN W/MINERALS CH   Oral   Take 1 tablet by mouth daily.         Marland Kitchen OLMESARTAN MEDOXOMIL-HCTZ 40-25 MG PO TABS   Oral   Take 1 tablet by mouth daily.   90 tablet   3   . OMEPRAZOLE 40 MG PO CPDR   Oral   Take 1 capsule (40 mg total) by mouth daily.   90 capsule   3   . ROSUVASTATIN CALCIUM 5 MG PO TABS   Oral   Take 1 tablet (5 mg total) by mouth every other day. Take 1 tablet mon,wed,and fri  . Per reqeust of patient  Medication called in till can get mailorder prescription   10 tablet   0   . TEMAZEPAM 15 MG PO CAPS   Oral   Take 15 mg by mouth at bedtime.           BP 168/70  Pulse 68  Temp 98.2 F (36.8 C) (Oral)  Resp 16  SpO2 100%  Physical Exam  Nursing note and vitals reviewed. Constitutional: She is oriented to person, place, and time. She appears well-developed and well-nourished. No distress.  HENT:  Head: Normocephalic and atraumatic.  Eyes: Conjunctivae normal and EOM are normal.  Cardiovascular: Normal rate and regular rhythm.   Pulmonary/Chest: Effort normal and breath sounds normal. No stridor. No respiratory distress.  Abdominal: She exhibits no distension.  Musculoskeletal: She exhibits no edema.  Neurological: She is alert and oriented to person, place, and time. No cranial nerve deficit.  Skin: Skin is warm and dry.  Psychiatric: She has a normal mood and affect.    ED Course  Procedures (including critical care time)  Labs Reviewed  BASIC METABOLIC PANEL - Abnormal; Notable for the following:    Glucose, Bld 109 (*)     GFR calc non Af Amer 59 (*)     GFR calc Af Amer 69 (*)     All other components within normal  limits  HEPATIC FUNCTION PANEL - Abnormal; Notable for the following:    Total Bilirubin 0.2 (*)     All other components within normal limits  LIPASE, BLOOD - Abnormal; Notable for the following:    Lipase 60 (*)     All other components within normal limits  CBC  TROPONIN I  PROTIME-INR  POCT  I-STAT TROPONIN I   Dg Chest 2 View  05/26/2012  *RADIOLOGY REPORT*  Clinical Data: Left-sided chest pain.  CHEST - 2 VIEW  Comparison: Chest x-ray 07/14/2011.  Findings: Lung volumes are normal.  No consolidative airspace disease.  No pleural effusions.  No pneumothorax.  No pulmonary nodule or mass noted.  Pulmonary vasculature and the cardiomediastinal silhouette are within normal limits.  IMPRESSION: 1. No radiographic evidence of acute cardiopulmonary disease.   Original Report Authenticated By: Trudie Reed, M.D.    Ct Abdomen Pelvis W Contrast  05/26/2012  *RADIOLOGY REPORT*  Clinical Data: Abdominal pain, elevated lipase.  CT ABDOMEN AND PELVIS WITH CONTRAST  Technique:  Multidetector CT imaging of the abdomen and pelvis was performed following the standard protocol during bolus administration of intravenous contrast.  Contrast: OMNIPAQUE IOHEXOL 300 MG/ML  SOLN  Comparison: 09/27/2004  Findings: Mitral annular calcification.  Trace pericardial fluid. Heart size within normal limits.  No pleural effusion.  Mild linear opacity within the left lower lobe and lingula, likely scarring or atelectasis.  Several hepatic and splenic calcifications are most in keeping with sequelae of prior granulomas infection.  Otherwise homogeneous hepatic and splenic enhancement.  Unremarkable biliary system, pancreas, adrenal glands.  Symmetric renal enhancement.  No hydronephrosis or hydroureter.  No urinary tract calculi.  No bowel obstruction.  No CT evidence for colitis.  Appendix not identified.  No right lower quadrant inflammation.  No free intraperitoneal air or fluid.  Calcified porta hepatis lymph nodes. No enlarged abdominopelvic lymph nodes.  There is scattered atherosclerotic calcification of the aorta and its branches. No aneurysmal dilatation.  Thin-walled bladder.  CT appearance to the uterus within normal limits.  There is a 12 mm left adnexal cyst, nonspecific.  Multilevel degenerative changes  of the imaged spine. No acute or aggressive appearing osseous lesion.  IMPRESSION: No acute abdominopelvic process identified.  No pancreatic abnormality identified despite the elevated lipase.  Sequelae of prior granulomas infection with hepatic, splenic, and port hepatis lymph node calcifications.  12 mm left adnexal cyst is nonspecific.  Consider non emergent pelvic ultrasound follow-up.   Original Report Authenticated By: Jearld Lesch, M.D.      1. Elevated lipase   2. Chest pain     Update, the patient was informed of the initial results, including elevated lipase.  Absent alcohol abuse, recent recent infection, the patient stated that you continue to have discomfort, and a CT scan was performed.  Date: The patient has made aware of all results.  She'll be discharged in stable condition to follow up with her gastroenterologist.  MDM  This generally well-appearing female presents with lower chest pain.  On exam she is in no distress.  The pain not reproducible.  The patient's vital signs, labs are largely reassuring, though there is an elevated lipase.  The patient's CT scan was unremarkable.  With no changes in her condition throughout her emergency department stay, with stable vitals, no distress, she is appropriate for continued evaluation as an outpatient.  She discharged with both PMD and gastroenterology followup.        Gerhard Munch, MD  05/26/12 2231 

## 2012-05-28 ENCOUNTER — Encounter: Payer: Self-pay | Admitting: Gastroenterology

## 2012-05-30 DIAGNOSIS — M5430 Sciatica, unspecified side: Secondary | ICD-10-CM | POA: Diagnosis not present

## 2012-05-30 DIAGNOSIS — M999 Biomechanical lesion, unspecified: Secondary | ICD-10-CM | POA: Diagnosis not present

## 2012-06-13 ENCOUNTER — Encounter: Payer: Self-pay | Admitting: *Deleted

## 2012-06-13 DIAGNOSIS — M5430 Sciatica, unspecified side: Secondary | ICD-10-CM | POA: Diagnosis not present

## 2012-06-13 DIAGNOSIS — M999 Biomechanical lesion, unspecified: Secondary | ICD-10-CM | POA: Diagnosis not present

## 2012-06-14 ENCOUNTER — Telehealth: Payer: Self-pay | Admitting: Internal Medicine

## 2012-06-14 NOTE — Telephone Encounter (Signed)
Pt called requesting a referral for ortho concerning problems with her knee. She is not sure if it is her leg that is the route to the problem. Please advise.

## 2012-06-14 NOTE — Telephone Encounter (Signed)
Patient would like a referral to a Orthopedic doctor for her knee

## 2012-06-15 NOTE — Telephone Encounter (Signed)
Reviewed last several office notes and ED evaluation - leg pain/knee problems not addressed. Will need OV prior to referral - need to know what we are asking the consultant to address.

## 2012-06-19 ENCOUNTER — Ambulatory Visit (INDEPENDENT_AMBULATORY_CARE_PROVIDER_SITE_OTHER): Payer: Medicare Other | Admitting: Gastroenterology

## 2012-06-19 ENCOUNTER — Encounter: Payer: Self-pay | Admitting: Gastroenterology

## 2012-06-19 VITALS — BP 100/58 | HR 79 | Ht 62.0 in | Wt 175.0 lb

## 2012-06-19 DIAGNOSIS — K589 Irritable bowel syndrome without diarrhea: Secondary | ICD-10-CM | POA: Diagnosis not present

## 2012-06-19 DIAGNOSIS — K648 Other hemorrhoids: Secondary | ICD-10-CM | POA: Diagnosis not present

## 2012-06-19 DIAGNOSIS — K5903 Drug induced constipation: Secondary | ICD-10-CM

## 2012-06-19 DIAGNOSIS — K5909 Other constipation: Secondary | ICD-10-CM

## 2012-06-19 MED ORDER — LINACLOTIDE 145 MCG PO CAPS: 145.0000 ug | ORAL_CAPSULE | Freq: Every day | ORAL | Status: DC

## 2012-06-19 MED ORDER — HYDROCORTISONE 2.5 % RE CREA: TOPICAL_CREAM | RECTAL | Status: DC | PRN

## 2012-06-19 MED ORDER — HYDROCORTISONE ACETATE 25 MG RE SUPP: 25.0000 mg | Freq: Every evening | RECTAL | Status: DC | PRN

## 2012-06-19 NOTE — Patient Instructions (Addendum)
We have sent the following medications to your pharmacy for you to pick up at your convenience: Anusol Suppositories and Anusol Cream, please use as directed.  Linzess was also sent to your pharmacy. Please take one capsule by mouth once daily.  Please call back and ask for Aram Beecham, Dr. Norval Gable nurse, in two weeks and give a report on how you are doing.

## 2012-06-19 NOTE — Telephone Encounter (Signed)
Left message with husband for patient to call back and schedule

## 2012-06-19 NOTE — Progress Notes (Signed)
This is a complex 66 year old Caucasian female on multiple medications who has chronic pain syndrome which causes chronic constipation which leads to periodic hemorrhoidal bleeding.  She is up-to-date her endoscopy and colonoscopy.  She really has no other GI complaints or symptoms at this time.  Negative colonoscopy in 2009.  Current Medications, Allergies, Past Medical History, Past Surgical History, Family History and Social History were reviewed in [employer] record.  Pertinent Review of Systems Negative   Physical Exam: Blood pressure 100/56, pulse 79 and regular, and weight 175 with a BMI of 32.01.  Abdominal exam is unremarkable without organomegaly, masses or tenderness.  Anoscopy: Small perianal skin tags noted.  Anoscopy exam otherwise unremarkable except for small posterior lateral internal hemorrhoid which is nonbleeding.    Assessment and Plan: Irritable bowel syndrome with worsening constipation related to narcotic use for chronic pain syndrome.  I have asked her to try Linzess 145 mcg daily while continuing her high fiber foods and fiber supplements with liberal by mouth fluids as tolerated.  She also is on a daily stool softener.  I do not think followup colonoscopy is indicated at this time.  I prescribed when necessary Anusol-HC suppositories as needed.  She is to call for prescription of her Linzess if this is a benefit.  I will see her only on when necessary basis as needed. No diagnosis found.

## 2012-06-20 NOTE — Telephone Encounter (Signed)
Patient called back to say that she called and made her own ortho appointment

## 2012-06-23 DIAGNOSIS — M76899 Other specified enthesopathies of unspecified lower limb, excluding foot: Secondary | ICD-10-CM | POA: Diagnosis not present

## 2012-07-12 DIAGNOSIS — M76899 Other specified enthesopathies of unspecified lower limb, excluding foot: Secondary | ICD-10-CM | POA: Diagnosis not present

## 2012-07-19 ENCOUNTER — Ambulatory Visit (INDEPENDENT_AMBULATORY_CARE_PROVIDER_SITE_OTHER): Payer: Medicare Other | Admitting: Cardiology

## 2012-07-19 ENCOUNTER — Encounter: Payer: Self-pay | Admitting: Cardiology

## 2012-07-19 VITALS — BP 132/80 | HR 70 | Ht 62.0 in | Wt 176.0 lb

## 2012-07-19 DIAGNOSIS — R079 Chest pain, unspecified: Secondary | ICD-10-CM

## 2012-07-19 DIAGNOSIS — I10 Essential (primary) hypertension: Secondary | ICD-10-CM

## 2012-07-19 DIAGNOSIS — I779 Disorder of arteries and arterioles, unspecified: Secondary | ICD-10-CM | POA: Diagnosis not present

## 2012-07-19 DIAGNOSIS — E785 Hyperlipidemia, unspecified: Secondary | ICD-10-CM | POA: Diagnosis not present

## 2012-07-19 MED ORDER — SIMVASTATIN 20 MG PO TABS: ORAL_TABLET | ORAL | Status: DC

## 2012-07-19 MED ORDER — DILTIAZEM HCL ER COATED BEADS 180 MG PO CP24: 180.0000 mg | ORAL_CAPSULE | Freq: Every day | ORAL | Status: DC

## 2012-07-19 MED ORDER — OLMESARTAN MEDOXOMIL-HCTZ 40-25 MG PO TABS: 1.0000 | ORAL_TABLET | Freq: Every day | ORAL | Status: DC

## 2012-07-19 MED ORDER — ATORVASTATIN CALCIUM 10 MG PO TABS: ORAL_TABLET | ORAL | Status: DC

## 2012-07-19 NOTE — Assessment & Plan Note (Signed)
The patient is on very low dose Crestor. It is costing her a large amount of money. This can be changed to a generic

## 2012-07-19 NOTE — Progress Notes (Signed)
HPI  Patient is seen for cardiology followup. I saw her last July, 2013. She was in the emergency room in November, 2013. I reviewed that record completely. It was felt that her symptoms were most probably GI at that time. GI followup is recommended. Since that time she has rare chest symptoms. They are not exertional.  Allergies  Allergen Reactions  . Codeine Nausea And Vomiting  . Nitrofuran Derivatives Nausea And Vomiting  . Celebrex (Celecoxib) Rash    Current Outpatient Prescriptions  Medication Sig Dispense Refill  . aspirin 81 MG tablet Take 81 mg by mouth daily.       . citalopram (CELEXA) 20 MG tablet Take 1 tablet (20 mg total) by mouth daily.  90 tablet  3  . diltiazem (CARDIZEM CD) 180 MG 24 hr capsule Take 180 mg by mouth daily.      . fluocinonide gel (LIDEX) 0.05 % Apply 1 application topically 2 (two) times daily as needed. for oral ulcers       . gabapentin (NEURONTIN) 300 MG capsule Take 1 capsule (300 mg total) by mouth 2 (two) times daily.  180 capsule  3  . HYDROcodone-acetaminophen (NORCO) 10-325 MG per tablet Take 1 tablet by mouth every 6 (six) hours as needed. For pain  60 tablet  1  . hydrocortisone (ANUSOL-HC) 2.5 % rectal cream Place rectally as needed for hemorrhoids.  30 g  0  . meloxicam (MOBIC) 15 MG tablet Take 1 tablet (15 mg total) by mouth daily.  90 tablet  3  . Multiple Vitamin (MULTIVITAMIN WITH MINERALS) TABS Take 1 tablet by mouth daily.      Marland Kitchen olmesartan-hydrochlorothiazide (BENICAR HCT) 40-25 MG per tablet Take 1 tablet by mouth daily.  90 tablet  3  . omeprazole (PRILOSEC) 40 MG capsule Take 1 capsule (40 mg total) by mouth daily.  90 capsule  3  . rosuvastatin (CRESTOR) 5 MG tablet Take 1 tablet (5 mg total) by mouth every other day. Take 1 tablet mon,wed,and fri  . Per reqeust of patient  Medication called in till can get mailorder prescription  10 tablet  0  . Sennosides-Docusate Sodium (STOOL SOFTENER LAXATIVE PO) Take by mouth as needed.       . temazepam (RESTORIL) 15 MG capsule Take 15 mg by mouth at bedtime.      . [DISCONTINUED] diltiazem (DILACOR XR) 180 MG 24 hr capsule Take 1 capsule (180 mg total) by mouth daily.  30 capsule  1    History   Social History  . Marital Status: Married    Spouse Name: N/A    Number of Children: 0  . Years of Education: 13   Occupational History  . retired   .     Social History Main Topics  . Smoking status: Former Smoker -- 1.0 packs/day for 15 years    Types: Cigarettes    Quit date: 07/11/1996  . Smokeless tobacco: Never Used  . Alcohol Use: No  . Drug Use: No  . Sexually Active: No   Other Topics Concern  . Not on file   Social History Narrative   [employer], [employer] school, AA degree [employer]. Married '64- 21 years/divorced; married '98. No children. 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    Family History  Problem Relation Age of Onset  . Coronary artery disease Father   . Heart attack Father   . Heart disease Father  CAD/MI x 3, last fatal  . Diabetes Neg Hx   . Colon cancer Neg Hx   . Breast cancer Paternal Aunt     x 2  . Stroke Mother   . Dementia Mother   . Heart disease Mother     chf  . Obesity Brother   . Hypertension Brother     Past Medical History  Diagnosis Date  . Peripheral neuropathy   . Fibromyalgia   . Hyperlipemia   . Degenerative joint disease     cervical spine  . GERD (gastroesophageal reflux disease)   . Lumbar disc disease   . Hypertension   . Depression   . Carotid artery disease     Doppler, April, 2012,  0-39% R. ICA, 60-79% LICA... no significant change... frequency of Doppler moved to yearly because of stability  . Chest pain     Nuclear stress test 2006 normal  . Hx of colonoscopy   . Drug therapy     msucle aches with simvastatin, but tolerates 5mg  Creastor  . Migraines   . Small fiber neuropathy   . Obesity   . Ejection fraction     EF 60%, echo, December 20, 2011  . Fatigue     June, 2013  . Diastolic dysfunction     Echo report, June, 2013  . Diverticulosis of colon (without mention of hemorrhage) 10/03/2007    Colonoscopy  . Internal hemorrhoids without mention of complication 10/03/2007    Colonoscopy  . Reflux esophagitis 04/21/00    EGD  . IBS (irritable bowel syndrome)   . Sleep apnea   . Pancreatitis     Past Surgical History  Procedure Date  . Laparoscopic ovarian cystectomy 1960's  . Appendectomy 1961  . Shoulder surgery 2004    "frozen"; right  . Trigger finger release 1996    right thumb    Patient Active Problem List  Diagnosis  . HYPERLIPIDEMIA  . INSOMNIA, CHRONIC  . DEPRESSION  . PERIPHERAL NEUROPATHY  . TINNITUS  . HYPERTENSION  . GASTROESOPHAGEAL REFLUX DISEASE  . OSTEOARTHROSIS UNSPEC WHETHER GEN/LOC ANK&FOOT  . DEGENERATIVE JOINT DISEASE, CERVICAL SPINE  . DISC DISEASE, LUMBAR  . FIBROMYALGIA  . PEDAL EDEMA  . MIGRAINES, HX OF  . APPENDECTOMY, HX OF  . Contact dermatitis and eczema due to plant  . Chest pain  . Drug therapy  . Carotid artery disease  . Routine health maintenance  . Bronchitis  . Ejection fraction  . Fatigue  . Diastolic dysfunction  . Need for prophylactic vaccination against Streptococcus pneumoniae (pneumococcus)    ROS   Patient denies fever, chills, headache, sweats, rash, change in vision, change in hearing, cough, nausea vomiting, urinary symptoms. All other systems are reviewed and are negative.  PHYSICAL EXAM   Patient is oriented to person time and place. Affect is normal. There is no jugular venous distention. Lungs are clear. Respiratory effort is nonlabored. Cardiac exam reveals S1 and S2. There no clicks or significant murmurs. The abdomen is soft. There is no peripheral edema.  Filed Vitals:   07/19/12 0958  BP: 132/80  Pulse: 70  Height: 5\' 2"  (1.575 m)  Weight: 176 lb (79.833 kg)  SpO2: 98%   I reviewed the EKG done in the emergency room in November,  2013. There was no significant change and no significant abnormality.  ASSESSMENT & PLAN

## 2012-07-19 NOTE — Assessment & Plan Note (Signed)
The patient does have documented carotid disease. This will be continued to be followed carefully. She does need to lose weight and exercise and have her lipids treated.

## 2012-07-19 NOTE — Patient Instructions (Addendum)
Your physician wants you to follow-up in: 6 months.   You will receive a reminder letter in the mail two months in advance. If you don't receive a letter, please call our office to schedule the follow-up appointment.  Your physician has recommended you make the following change in your medication: STOP your Crestor (Rosuvastatin) and START Lipitor (Atorvastatin) one tab in the evening three nights per week (Mon, Wed, Fri).

## 2012-07-19 NOTE — Assessment & Plan Note (Signed)
Blood pressure is controlled. No change in therapy. 

## 2012-07-19 NOTE — Assessment & Plan Note (Signed)
The patient has had some vague recurrent chest pain. She was evaluated in the emergency room with no significant findings related to the cardiac system. I talked with her about possible followup stress test. She is very hesitant and does not want to do this. I am comfortable following her.

## 2012-09-17 DIAGNOSIS — H2510 Age-related nuclear cataract, unspecified eye: Secondary | ICD-10-CM | POA: Diagnosis not present

## 2012-10-02 ENCOUNTER — Ambulatory Visit (INDEPENDENT_AMBULATORY_CARE_PROVIDER_SITE_OTHER): Payer: Medicare Other | Admitting: Internal Medicine

## 2012-10-02 ENCOUNTER — Encounter: Payer: Self-pay | Admitting: Internal Medicine

## 2012-10-02 VITALS — BP 130/76 | HR 78 | Temp 98.0°F | Resp 16 | Ht 62.0 in | Wt 177.0 lb

## 2012-10-02 DIAGNOSIS — M19079 Primary osteoarthritis, unspecified ankle and foot: Secondary | ICD-10-CM

## 2012-10-02 DIAGNOSIS — G4700 Insomnia, unspecified: Secondary | ICD-10-CM

## 2012-10-02 NOTE — Progress Notes (Signed)
Subjective:     Patient ID: [name], female   DOB: [birth year], 67 y.o.   MRN: [id number]  HPI [name] is a 67 yo woman that presents 2 weeks after discontinuing the use of her prescribed meloxicam d/t GI distress (she initially thought her abdominal/chest pain was of cardiac origin but has sine been cleared by her cardiologist).  Since then her GI symptoms have subsided but she has had significantly increased pain in her feet, knees, and back.  She has started taking glucosamine and methylsulfonylmethane without any real relief.  She has not tried acetaminophen due to fear of liver disease.  She also wanted to discuss options for her insomnia.  She was previously taking temazopam, which was working, however she stopped taking it because her medication service informed her the price was going to increase significantly.  Past Medical History  Diagnosis Date  . Peripheral neuropathy   . Fibromyalgia   . Hyperlipemia   . Degenerative joint disease     cervical spine  . GERD (gastroesophageal reflux disease)   . Lumbar disc disease   . Hypertension   . Depression   . Carotid artery disease     Doppler, April, 2012,  0-39% R. ICA, 60-79% LICA... no significant change... frequency of Doppler moved to yearly because of stability  . Chest pain     Nuclear stress test 2006 normal  . Hx of colonoscopy   . Drug therapy     msucle aches with simvastatin, but tolerates 5mg  Creastor  . Migraines   . Small fiber neuropathy   . Obesity   . Ejection fraction     EF 60%, echo, December 20, 2011  . Fatigue     June, 2013  . Diastolic dysfunction     Echo report, June, 2013  . Diverticulosis of colon (without mention of hemorrhage) 10/03/2007    Colonoscopy  . Internal hemorrhoids without mention of complication 10/03/2007    Colonoscopy  . Reflux esophagitis 04/21/00    EGD  . IBS (irritable bowel syndrome)   . Sleep apnea   . Pancreatitis    Past Surgical History  Procedure  Laterality Date  . Laparoscopic ovarian cystectomy  1960's  . Appendectomy  1961  . Shoulder surgery  2004    "frozen"; right  . Trigger finger release  1996    right thumb   Family History  Problem Relation Age of Onset  . Coronary artery disease Father   . Heart attack Father   . Heart disease Father     CAD/MI x 3, last fatal  . Diabetes Neg Hx   . Colon cancer Neg Hx   . Breast cancer Paternal Aunt     x 2  . Stroke Mother   . Dementia Mother   . Heart disease Mother     chf  . Obesity Brother   . Hypertension Brother    History   Social History  . Marital Status: Married    Spouse Name: N/A    Number of Children: 0  . Years of Education: 13   Occupational History  . retired   .     Social History Main Topics  . Smoking status: Former Smoker -- 1.00 packs/day for 15 years    Types: Cigarettes    Quit date: 07/11/1996  . Smokeless tobacco: Never Used  . Alcohol Use: No  . Drug Use: No  . Sexually Active: No   Other Topics Concern  .  Not on file   Social History Narrative   HSG, [employer] school, AA degree [employer]. Married '64- 21 years/divorced; married '98. No children. 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   Current Outpatient Prescriptions on File Prior to Visit  Medication Sig Dispense Refill  . aspirin 81 MG tablet Take 81 mg by mouth daily.       Marland Kitchen atorvastatin (LIPITOR) 10 MG tablet Take one tab in the evening by mouth three days per week (Mon, Wed, Fri)  30 tablet  6  . citalopram (CELEXA) 20 MG tablet Take 1 tablet (20 mg total) by mouth daily.  90 tablet  3  . diltiazem (CARDIZEM CD) 180 MG 24 hr capsule Take 1 capsule (180 mg total) by mouth daily.  90 capsule  1  . fluocinonide gel (LIDEX) 0.05 % Apply 1 application topically 2 (two) times daily as needed. for oral ulcers       . gabapentin (NEURONTIN) 300 MG capsule Take 1 capsule (300 mg total) by mouth 2 (two) times daily.  180  capsule  3  . HYDROcodone-acetaminophen (NORCO) 10-325 MG per tablet Take 1 tablet by mouth every 6 (six) hours as needed. For pain  60 tablet  1  . hydrocortisone (ANUSOL-HC) 2.5 % rectal cream Place rectally as needed for hemorrhoids.  30 g  0  . meloxicam (MOBIC) 15 MG tablet Take 1 tablet (15 mg total) by mouth daily.  90 tablet  3  . Multiple Vitamin (MULTIVITAMIN WITH MINERALS) TABS Take 1 tablet by mouth daily.      Marland Kitchen olmesartan-hydrochlorothiazide (BENICAR HCT) 40-25 MG per tablet Take 1 tablet by mouth daily.  90 tablet  3  . omeprazole (PRILOSEC) 40 MG capsule Take 1 capsule (40 mg total) by mouth daily.  90 capsule  3  . Sennosides-Docusate Sodium (STOOL SOFTENER LAXATIVE PO) Take by mouth as needed.      . simvastatin (ZOCOR) 20 MG tablet Take one tablet by mouth in the evenings three times weekly (Mon, Wed, Fri).  30 tablet  1  . temazepam (RESTORIL) 15 MG capsule Take 15 mg by mouth at bedtime.      . [DISCONTINUED] diltiazem (DILACOR XR) 180 MG 24 hr capsule Take 1 capsule (180 mg total) by mouth daily.  30 capsule  1   No current facility-administered medications on file prior to visit.   Review of Systems  Constitutional: Negative for fever.  Respiratory: Negative for cough and shortness of breath.   Cardiovascular: Negative for chest pain.  Gastrointestinal: Negative for nausea, vomiting and abdominal pain.  Musculoskeletal: Positive for myalgias, back pain and arthralgias.  Neurological:       Paresthesias in her feet  All other systems reviewed and are negative.   Objective:   Physical Exam  Constitutional: She appears well-developed and well-nourished. No distress.  HENT:  Head: Normocephalic and atraumatic.  Eyes: EOM are normal. Pupils are equal, round, and reactive to light.  Cardiovascular: Normal rate, regular rhythm, normal heart sounds and intact distal pulses.   Pulmonary/Chest: Effort normal and breath sounds normal. No respiratory distress. She has no  wheezes. She has no rales.  Abdominal: Soft. Bowel sounds are normal. There is no tenderness.  Musculoskeletal:       Right knee: Tenderness (with palpation) found.       Left knee: Tenderness (with palpation) found.       Right ankle: Tenderness.  Left ankle: Tenderness.  There was no point tenderness along the spine.  Most of the pt's pain was elicited by walking across the room.  Neurological: She is alert.  Skin: Skin is warm and dry.  Psychiatric: She has a normal mood and affect.     Assessment/Plan:  Agree with assessment and plan per Mr. Lucretia Roers, MS III. Patient was interviewed personally.

## 2012-10-02 NOTE — Assessment & Plan Note (Signed)
Assessment: this is a chronic problem that has recently worsened with discontinuation of NSAID tx. Plan: - acetaminophen 500mg  3x's daily, if no relief increase dose to 1000mg  3x's daily - if pain persists despite maximum acetaminophen tx please start prednisone 10 mg daily in addition to acetaminophen - maximum dosage of acetaminophen is 3000 mg daily pt instructed to avoid other medications containing acetaminophen

## 2012-10-02 NOTE — Assessment & Plan Note (Signed)
Assessment: pt was previously well controlled with temazepam but stopped secondary to cost. Plan: - prescribed temazepam 15 mg at bedtime  - pt instructed to fill script at [facility], or [facility] given their cheaper prices.

## 2012-10-11 ENCOUNTER — Encounter (INDEPENDENT_AMBULATORY_CARE_PROVIDER_SITE_OTHER): Payer: Medicare Other

## 2012-10-11 DIAGNOSIS — I6529 Occlusion and stenosis of unspecified carotid artery: Secondary | ICD-10-CM

## 2012-10-27 ENCOUNTER — Encounter: Payer: Self-pay | Admitting: Cardiology

## 2012-11-21 DIAGNOSIS — M5430 Sciatica, unspecified side: Secondary | ICD-10-CM | POA: Diagnosis not present

## 2012-11-21 DIAGNOSIS — M999 Biomechanical lesion, unspecified: Secondary | ICD-10-CM | POA: Diagnosis not present

## 2012-12-11 ENCOUNTER — Other Ambulatory Visit: Payer: Self-pay | Admitting: Internal Medicine

## 2012-12-12 ENCOUNTER — Other Ambulatory Visit: Payer: Self-pay

## 2012-12-12 NOTE — Telephone Encounter (Signed)
diltiazem (CARDIZEM CD) 180 MG 24 hr capsule 90 capsule 1 07/19/2012        Sig - Route: Take 1 capsule (180 mg total) by mouth daily. - Oral      E-Prescribing Status: Receipt confirmed by pharmacy (07/19/2012 10:25 AM EST)

## 2012-12-18 ENCOUNTER — Other Ambulatory Visit: Payer: Self-pay

## 2012-12-18 MED ORDER — SIMVASTATIN 20 MG PO TABS: ORAL_TABLET | ORAL | Status: DC

## 2012-12-18 MED ORDER — DILTIAZEM HCL ER COATED BEADS 180 MG PO CP24: 180.0000 mg | ORAL_CAPSULE | Freq: Every day | ORAL | Status: DC

## 2013-01-14 DIAGNOSIS — M5430 Sciatica, unspecified side: Secondary | ICD-10-CM | POA: Diagnosis not present

## 2013-01-14 DIAGNOSIS — M999 Biomechanical lesion, unspecified: Secondary | ICD-10-CM | POA: Diagnosis not present

## 2013-01-20 ENCOUNTER — Encounter: Payer: Self-pay | Admitting: Cardiology

## 2013-01-22 ENCOUNTER — Encounter: Payer: Self-pay | Admitting: Cardiology

## 2013-01-22 ENCOUNTER — Ambulatory Visit (INDEPENDENT_AMBULATORY_CARE_PROVIDER_SITE_OTHER): Payer: Medicare Other | Admitting: Cardiology

## 2013-01-22 VITALS — BP 122/68 | HR 71 | Ht 62.0 in | Wt 177.1 lb

## 2013-01-22 DIAGNOSIS — R079 Chest pain, unspecified: Secondary | ICD-10-CM

## 2013-01-22 DIAGNOSIS — I779 Disorder of arteries and arterioles, unspecified: Secondary | ICD-10-CM

## 2013-01-22 NOTE — Patient Instructions (Addendum)
Your physician recommends that you schedule a follow-up appointment as needed with Dr Myrtis Ser.  Your physician recommends that you continue on your current medications as directed. Please refer to the Current Medication list given to you today. Future refills can be addressed by Dr Alvera Novel office.

## 2013-01-22 NOTE — Assessment & Plan Note (Signed)
She's not having any recurrent chest pain. No further workup. 

## 2013-01-22 NOTE — Assessment & Plan Note (Signed)
Her Doppler in April, 2014 was stable. She does have disease that needs to be followed in one year.

## 2013-01-22 NOTE — Progress Notes (Signed)
HPI  Patient is seen today to followup chest pain. She feels much better. She tells me that when she stopped meloxicam that she had no recurrent symptoms. She does not need any further cardiac workup. She does have carotid disease that is being followed.  Allergies  Allergen Reactions  . Codeine Nausea And Vomiting  . Nitrofuran Derivatives Nausea And Vomiting  . Celebrex (Celecoxib) Rash    Current Outpatient Prescriptions  Medication Sig Dispense Refill  . aspirin 81 MG tablet Take 81 mg by mouth daily.       . citalopram (CELEXA) 20 MG tablet Take 1 tablet (20 mg total) by mouth daily.  90 tablet  3  . diltiazem (CARDIZEM CD) 180 MG 24 hr capsule Take 1 capsule (180 mg total) by mouth daily.  90 capsule  1  . fluocinonide gel (LIDEX) 0.05 % Apply 1 application topically 2 (two) times daily as needed. for oral ulcers       . gabapentin (NEURONTIN) 300 MG capsule Take 1 capsule (300 mg total) by mouth 2 (two) times daily.  180 capsule  3  . HYDROcodone-acetaminophen (NORCO) 10-325 MG per tablet Take 1 tablet by mouth every 6 (six) hours as needed. For pain  60 tablet  1  . hydrocortisone (ANUSOL-HC) 2.5 % rectal cream Place rectally as needed for hemorrhoids.  30 g  0  . meloxicam (MOBIC) 15 MG tablet Take 15 mg by mouth as needed.      . Multiple Vitamin (MULTIVITAMIN WITH MINERALS) TABS Take 1 tablet by mouth daily.      Marland Kitchen olmesartan-hydrochlorothiazide (BENICAR HCT) 40-25 MG per tablet Take 1 tablet by mouth daily.  90 tablet  3  . omeprazole (PRILOSEC) 40 MG capsule Take 1 capsule by mouth  daily  90 capsule  0  . Sennosides-Docusate Sodium (STOOL SOFTENER LAXATIVE PO) Take by mouth as needed.      . simvastatin (ZOCOR) 20 MG tablet Take one tablet by mouth in the evenings three times weekly (Mon, Wed, Fri).  90 tablet  1  . temazepam (RESTORIL) 15 MG capsule Take 15 mg by mouth at bedtime as needed.       . [DISCONTINUED] diltiazem (DILACOR XR) 180 MG 24 hr capsule Take 1 capsule  (180 mg total) by mouth daily.  30 capsule  1   No current facility-administered medications for this visit.    History   Social History  . Marital Status: Married    Spouse Name: N/A    Number of Children: 0  . Years of Education: 13   Occupational History  . retired   .     Social History Main Topics  . Smoking status: Former Smoker -- 1.00 packs/day for 15 years    Types: Cigarettes    Quit date: 07/11/1996  . Smokeless tobacco: Never Used  . Alcohol Use: No  . Drug Use: No  . Sexually Active: No   Other Topics Concern  . Not on file   Social History Narrative   HSG, [employer] school, AA degree [employer]. Married '64- 21 years/divorced; married '98. No children. 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    Family History  Problem Relation Age of Onset  . Coronary artery disease Father   . Heart attack Father   . Heart disease Father     CAD/MI x 3, last fatal  . Diabetes Neg Hx   . Colon cancer Neg  Hx   . Breast cancer Paternal Aunt     x 2  . Stroke Mother   . Dementia Mother   . Heart disease Mother     chf  . Obesity Brother   . Hypertension Brother     Past Medical History  Diagnosis Date  . Peripheral neuropathy   . Fibromyalgia   . Hyperlipemia   . Degenerative joint disease     cervical spine  . GERD (gastroesophageal reflux disease)   . Lumbar disc disease   . Hypertension   . Depression   . Carotid artery disease     Doppler, April, 2012,  0-39% R. ICA, 60-79% LICA... no significant change... frequency of Doppler moved to yearly because of stability  . Chest pain     Nuclear stress test 2006 normal  . Hx of colonoscopy   . Drug therapy     msucle aches with simvastatin, but tolerates 5mg  Creastor  . Migraines   . Small fiber neuropathy   . Obesity   . Ejection fraction     EF 60%, echo, December 20, 2011  . Fatigue     June, 2013  . Diastolic dysfunction     Echo report, June, 2013    . Diverticulosis of colon (without mention of hemorrhage) 10/03/2007    Colonoscopy  . Internal hemorrhoids without mention of complication 10/03/2007    Colonoscopy  . Reflux esophagitis 04/21/00    EGD  . IBS (irritable bowel syndrome)   . Sleep apnea   . Pancreatitis     Past Surgical History  Procedure Laterality Date  . Laparoscopic ovarian cystectomy  1960's  . Appendectomy  1961  . Shoulder surgery  2004    "frozen"; right  . Trigger finger release  1996    right thumb    Patient Active Problem List   Diagnosis Date Noted  . Insomnia 10/02/2012  . Need for prophylactic vaccination against Streptococcus pneumoniae (pneumococcus) 03/22/2012  . Fatigue   . Diastolic dysfunction   . Ejection fraction   . Bronchitis 11/28/2011  . Routine health maintenance 03/26/2011  . Carotid artery disease   . Chest pain   . Drug therapy   . Contact dermatitis and eczema due to plant 11/08/2010  . PEDAL EDEMA 03/17/2010  . INSOMNIA, CHRONIC 04/17/2009  . OSTEOARTHROSIS UNSPEC WHETHER GEN/LOC ANK&FOOT 01/05/2009  . DEPRESSION 05/22/2008  . TINNITUS 08/30/2007  . HYPERLIPIDEMIA 04/05/2007  . PERIPHERAL NEUROPATHY 04/05/2007  . HYPERTENSION 04/05/2007  . GASTROESOPHAGEAL REFLUX DISEASE 04/05/2007  . DEGENERATIVE JOINT DISEASE, CERVICAL SPINE 04/05/2007  . DISC DISEASE, LUMBAR 04/05/2007  . FIBROMYALGIA 04/05/2007  . MIGRAINES, HX OF 04/05/2007  . APPENDECTOMY, HX OF 04/05/2007    ROS   Patient denies fever, chills, headache, sweats, rash, change in vision, change in hearing, chest pain, cough, nausea vomiting, urinary symptoms. All other systems are reviewed and are negative.  PHYSICAL EXAM   Patient is oriented to person time and place. Affect is normal. There is no jugulovenous distention. Lungs are clear. Respiratory effort is nonlabored. Cardiac exam reveals S1 and S2. There no clicks or significant murmurs. The abdomen is soft. Is no peripheral edema.  Filed Vitals:    01/22/13 1530  BP: 122/68  Pulse: 71  Height: 5\' 2"  (1.575 m)  Weight: 177 lb 1.9 oz (80.341 kg)   EKG is done today and reviewed by me. It is normal.  ASSESSMENT & PLAN

## 2013-02-12 ENCOUNTER — Encounter: Payer: Self-pay | Admitting: Internal Medicine

## 2013-02-12 ENCOUNTER — Ambulatory Visit (INDEPENDENT_AMBULATORY_CARE_PROVIDER_SITE_OTHER): Payer: Medicare Other | Admitting: Internal Medicine

## 2013-02-12 VITALS — BP 102/60 | HR 68 | Temp 97.0°F | Resp 12 | Wt 176.0 lb

## 2013-02-12 DIAGNOSIS — M19079 Primary osteoarthritis, unspecified ankle and foot: Secondary | ICD-10-CM | POA: Diagnosis not present

## 2013-02-12 DIAGNOSIS — IMO0001 Reserved for inherently not codable concepts without codable children: Secondary | ICD-10-CM | POA: Diagnosis not present

## 2013-02-12 DIAGNOSIS — G609 Hereditary and idiopathic neuropathy, unspecified: Secondary | ICD-10-CM

## 2013-02-12 MED ORDER — HYDROCORTISONE ACETATE 25 MG RE SUPP: 25.0000 mg | Freq: Two times a day (BID) | RECTAL | Status: DC

## 2013-02-12 MED ORDER — METHOCARBAMOL 500 MG PO TABS: 500.0000 mg | ORAL_TABLET | Freq: Four times a day (QID) | ORAL | Status: DC

## 2013-02-12 MED ORDER — HYDROCODONE-ACETAMINOPHEN 10-325 MG PO TABS: 1.0000 | ORAL_TABLET | Freq: Four times a day (QID) | ORAL | Status: DC | PRN

## 2013-02-12 NOTE — Progress Notes (Signed)
Subjective:     Patient ID: [name], female   DOB: 27-Jun-1946, 67 y.o.   MRN: [id number]  HPI Comments: [name], a 67 year old lady with a history significant for peripheral neuropathy, degenerative joint disease and fibromyalgia, presents today with longstanding, widespread pain affecting her legs, back, hands, flanks and neck.  The leg pain is throbbing in character, but occasionally feels like a knife shooting down into the left foot.  It is severe enough to force her to stop walking and it occurs "whenever it wants to happen," estimated to be once per week.  She notes that the pain is reduced by use of a Salonpas pad.  [name] attributes her leg pain to peripheral neuropathy but wonders whether arthritis could also be contributing to it.  She also notes that her knees hurt her while going up steps and knee pain is relieved with rest.  The pain in her hands seems to be getting worse and is exacerbated by activity.  She states that "knots are growing" in her distal interphalangeal joints and hands are worse after activity.  She denies any numbness but does note some tingling in her hands.  Her back severely hurts with strenuous arm activity.  Furthermore, she notes that her flanks also hurt, though she notes that she only notices this pain when she stops moving, especially at night.  She denies any dysuria, increased urinary frequency or urinary urgency.  She is worried that this is due to ovarian cancer since a friend was recently diagnosed with it, so she requests a pap smear.     Leg Pain  The incident occurred more than 1 week ago. There was no injury mechanism. The quality of the pain is described as aching, burning and stabbing. The pain has been fluctuating since onset. Associated symptoms include tingling. The symptoms are aggravated by movement.   Review of Systems  Constitutional: Negative for fever, chills, weight loss and diaphoresis.  HENT: Positive for neck pain and  tinnitus. Negative for hearing loss and ear pain.        Tinnitus in L ear  Eyes: Negative for blurred vision, double vision, discharge and redness.  Cardiovascular: Negative for chest pain and palpitations.  Gastrointestinal: Positive for diarrhea and constipation. Negative for nausea and vomiting.       Constipation and diarrhea come and go Attributes constipation to hydrocodone  Genitourinary: Positive for flank pain. Negative for dysuria, urgency and frequency.  Musculoskeletal: Positive for back pain and joint pain.  Neurological: Positive for tingling. Negative for weakness and headaches.      Review of Systems  Constitutional: Negative for fever, chills, weight loss and diaphoresis.  HENT: Positive for neck pain and tinnitus. Negative for hearing loss and ear pain.        Tinnitus in L ear  Eyes: Negative for blurred vision, double vision, discharge and redness.  Cardiovascular: Negative for chest pain and palpitations.  Gastrointestinal: Positive for diarrhea and constipation. Negative for nausea and vomiting.       Constipation and diarrhea come and go Attributes constipation to hydrocodone  Genitourinary: Positive for flank pain. Negative for dysuria, urgency and frequency.  Musculoskeletal: Positive for back pain and joint pain.  Neurological: Positive for tingling. Negative for weakness and headaches.    Objective:   Physical Exam  Constitutional: She is oriented to person, place, and time.  HENT:  Head: Normocephalic and atraumatic.  Right Ear: External ear normal.  Left Ear: External ear normal.  Eyes: Conjunctivae and EOM are normal. Pupils are equal, round, and reactive to light. Right eye exhibits no discharge. Left eye exhibits no discharge. No scleral icterus.  Neck: No tracheal deviation present. No thyromegaly present.  Cardiovascular: Normal rate and regular rhythm.   Musculoskeletal: She exhibits no edema and no tenderness.  Neurological: She is alert and  oriented to person, place, and time. She has normal reflexes. She displays normal reflexes. No cranial nerve deficit. She exhibits normal muscle tone. Coordination normal.  Minimal pronator drift  Skin: No rash noted. No erythema. No pallor.      Assessment:     A&P:  1. Leg pain: [name]'s leg pain is likely due to a combination of fibromyalgia and osteoarthritis.  She was advised to continue using Gabapentin and start taking ibuprofen.  She may continue using the Salonpas as needed.  2. Foot pain: Likely due to a combination of a calcified mass, fibromyalgia and osteoarthritis.  She was advised to continue gabapentin and start taking ibuprofen.  3. Back pain: Likely due to degenerative joint disease.  She has been advised to regularly use ibuprofen and to use heating pads as needed.    Plan:

## 2013-02-12 NOTE — Patient Instructions (Addendum)
Good to see you  Diffuse osteoarthritis that is affecting you hands, toes, ankles. Plan  heat, rub of choice, ibuprofen 200 mg three times a day  Sparing use of hydrocodone  Body aches and muscle spasm Plan - Robaxin as needed up to 4 times a day  Peripheral neuropathy - continue the gabapentin.

## 2013-02-13 NOTE — Assessment & Plan Note (Signed)
Diffuse osteoarthritis that is affecting you hands, toes, ankles. Plan  heat, rub of choice, ibuprofen 200 mg three times a day  Sparing use of hydrocodone

## 2013-02-13 NOTE — Assessment & Plan Note (Signed)
Peripheral neuropathy - continue the gabapentin

## 2013-02-13 NOTE — Assessment & Plan Note (Signed)
Body aches and muscle spasm Plan - Robaxin as needed up to 4 times a day

## 2013-02-27 DIAGNOSIS — M999 Biomechanical lesion, unspecified: Secondary | ICD-10-CM | POA: Diagnosis not present

## 2013-02-27 DIAGNOSIS — M5430 Sciatica, unspecified side: Secondary | ICD-10-CM | POA: Diagnosis not present

## 2013-04-09 ENCOUNTER — Ambulatory Visit (INDEPENDENT_AMBULATORY_CARE_PROVIDER_SITE_OTHER): Payer: Medicare Other | Admitting: Internal Medicine

## 2013-04-09 ENCOUNTER — Encounter: Payer: Medicare Other | Admitting: Internal Medicine

## 2013-04-09 ENCOUNTER — Other Ambulatory Visit (INDEPENDENT_AMBULATORY_CARE_PROVIDER_SITE_OTHER): Payer: Medicare Other

## 2013-04-09 ENCOUNTER — Other Ambulatory Visit (HOSPITAL_COMMUNITY)
Admission: RE | Admit: 2013-04-09 | Discharge: 2013-04-09 | Disposition: A | Discharge status: Home / Self Care | Payer: Medicare Other | Source: Ambulatory Visit | Attending: Internal Medicine | Admitting: Internal Medicine

## 2013-04-09 ENCOUNTER — Encounter: Payer: Self-pay | Admitting: Internal Medicine

## 2013-04-09 VITALS — BP 130/72 | HR 66 | Temp 97.6°F | Ht 62.0 in | Wt 176.8 lb

## 2013-04-09 DIAGNOSIS — K219 Gastro-esophageal reflux disease without esophagitis: Secondary | ICD-10-CM

## 2013-04-09 DIAGNOSIS — E785 Hyperlipidemia, unspecified: Secondary | ICD-10-CM

## 2013-04-09 DIAGNOSIS — I10 Essential (primary) hypertension: Secondary | ICD-10-CM | POA: Diagnosis not present

## 2013-04-09 DIAGNOSIS — G609 Hereditary and idiopathic neuropathy, unspecified: Secondary | ICD-10-CM

## 2013-04-09 DIAGNOSIS — Z124 Encounter for screening for malignant neoplasm of cervix: Secondary | ICD-10-CM | POA: Diagnosis not present

## 2013-04-09 DIAGNOSIS — Z0000 Encounter for general adult medical examination without abnormal findings: Secondary | ICD-10-CM

## 2013-04-09 DIAGNOSIS — IMO0001 Reserved for inherently not codable concepts without codable children: Secondary | ICD-10-CM

## 2013-04-09 LAB — COMPREHENSIVE METABOLIC PANEL
ALT: 17 U/L (ref 0–35)
AST: 22 U/L (ref 0–37)
Albumin: 4.1 g/dL (ref 3.5–5.2)
Alkaline Phosphatase: 66 U/L (ref 39–117)
BUN: 22 mg/dL (ref 6–23)
CO2: 29 mEq/L (ref 19–32)
Calcium: 9.3 mg/dL (ref 8.4–10.5)
Chloride: 102 mEq/L (ref 96–112)
Creatinine, Ser: 1.3 mg/dL — ABNORMAL HIGH (ref 0.4–1.2)
GFR: 43.66 mL/min — ABNORMAL LOW (ref >60.00)
Glucose, Bld: 95 mg/dL (ref 70–99)
Potassium: 3.5 mEq/L (ref 3.5–5.1)
Sodium: 136 mEq/L (ref 135–145)
Total Bilirubin: 0.3 mg/dL (ref 0.3–1.2)
Total Protein: 6.8 g/dL (ref 6.0–8.3)

## 2013-04-09 LAB — HEPATIC FUNCTION PANEL
ALT: 17 U/L (ref 0–35)
AST: 22 U/L (ref 0–37)
Albumin: 4.1 g/dL (ref 3.5–5.2)
Alkaline Phosphatase: 66 U/L (ref 39–117)
Bilirubin, Direct: 0 mg/dL (ref 0.0–0.3)
Total Bilirubin: 0.3 mg/dL (ref 0.3–1.2)
Total Protein: 6.8 g/dL (ref 6.0–8.3)

## 2013-04-09 LAB — LIPID PANEL
Cholesterol: 197 mg/dL (ref 0–200)
HDL: 64.9 mg/dL (ref >39.00)
LDL Cholesterol: 108 mg/dL — ABNORMAL HIGH (ref 0–99)
Total CHOL/HDL Ratio: 3
Triglycerides: 119 mg/dL (ref 0.0–149.0)
VLDL: 23.8 mg/dL (ref 0.0–40.0)

## 2013-04-09 LAB — HEMOGLOBIN AND HEMATOCRIT, BLOOD
HCT: 37.8 % (ref 36.0–46.0)
Hemoglobin: 13 g/dL (ref 12.0–15.0)

## 2013-04-09 LAB — TSH: TSH: 1.01 u[IU]/mL (ref 0.35–5.50)

## 2013-04-09 LAB — VITAMIN B12: Vitamin B-12: 265 pg/mL (ref 211–911)

## 2013-04-09 MED ORDER — TEMAZEPAM 15 MG PO CAPS: 15.0000 mg | ORAL_CAPSULE | Freq: Every evening | ORAL | Status: DC | PRN

## 2013-04-09 NOTE — Assessment & Plan Note (Signed)
On going pain and discomfort. A little worse with the vagaries of age.

## 2013-04-09 NOTE — Progress Notes (Signed)
Subjective:    Patient ID: [name], female    DOB: 1946-03-17, 67 y.o.   MRN: [id number]  HPI The patient is here for annual Medicare wellness examination and management of other chronic and acute problems. \ She has been doing ok except for chronic foot pain.   The risk factors are reflected in the social history.  The roster of all physicians providing medical care to patient - is listed in the Snapshot section of the chart.  Activities of daily living:  The patient is 100% inedpendent in all ADLs: dressing, toileting, feeding as well as independent mobility  Home safety : The patient has smoke detectors in the home. They wear seatbelts. firearms are present in the home, kept in a safe fashion. There is no violence in the home.   There is no risks for hepatitis, STDs or HIV. There is no   history of blood transfusion. They have no travel history to infectious disease endemic areas of the world.  The patient has  seen their dentist in the last six month. They have seen their eye doctor in the last year. They deny any hearing difficulty and have not had audiologic testing in the last year.    They do not  have excessive sun exposure. Discussed the need for sun protection: hats, long sleeves and use of sunscreen if there is significant sun exposure.   Diet: the importance of a healthy diet is discussed. They do have a healthy diet.  The patient has no regular exercise program.  The benefits of regular aerobic exercise were discussed.  Depression screen: there are no signs or vegative symptoms of depression- irritability, change in appetite, anhedonia, sadness/tearfullness.  Cognitive assessment: the patient manages all their financial and personal affairs and is actively engaged.   The following portions of the patient's history were reviewed and updated as appropriate: allergies, current medications, past family history, past medical history,  past surgical history, past  social history  and problem list.  Vision, hearing, body mass index were assessed and reviewed.   During the course of the visit the patient was educated and counseled about appropriate screening and preventive services including : fall prevention , diabetes screening, nutrition counseling, colorectal cancer screening, and recommended immunizations.  Past Medical History  Diagnosis Date  . Peripheral neuropathy   . Fibromyalgia   . Hyperlipemia   . Degenerative joint disease     cervical spine  . GERD (gastroesophageal reflux disease)   . Lumbar disc disease   . Hypertension   . Depression   . Carotid artery disease     Doppler, April, 2012,  0-39% R. ICA, 60-79% LICA... no significant change... frequency of Doppler moved to yearly because of stability  . Chest pain     Nuclear stress test 2006 normal  . Hx of colonoscopy   . Drug therapy     msucle aches with simvastatin, but tolerates 5mg  Creastor  . Migraines   . Small fiber neuropathy   . Obesity   . Ejection fraction     EF 60%, echo, December 20, 2011  . Fatigue     June, 2013  . Diastolic dysfunction     Echo report, June, 2013  . Diverticulosis of colon (without mention of hemorrhage) 10/03/2007    Colonoscopy  . Internal hemorrhoids without mention of complication 10/03/2007    Colonoscopy  . Reflux esophagitis 04/21/00    EGD  . IBS (irritable bowel syndrome)   . Sleep  apnea   . Pancreatitis    Past Surgical History  Procedure Laterality Date  . Laparoscopic ovarian cystectomy  1960's  . Appendectomy  1961  . Shoulder surgery  2004    "frozen"; right  . Trigger finger release  1996    right thumb   Family History  Problem Relation Age of Onset  . Coronary artery disease Father   . Heart attack Father   . Heart disease Father     CAD/MI x 3, last fatal  . Diabetes Neg Hx   . Colon cancer Neg Hx   . Breast cancer Paternal Aunt     x 2  . Stroke Mother   . Dementia Mother   . Heart disease Mother      chf  . Obesity Brother   . Hypertension Brother    History   Social History  . Marital Status: Married    Spouse Name: N/A    Number of Children: 0  . Years of Education: 13   Occupational History  . retired   .     Social History Main Topics  . Smoking status: Former Smoker -- 1.00 packs/day for 15 years    Types: Cigarettes    Quit date: 07/11/1996  . Smokeless tobacco: Never Used  . Alcohol Use: No  . Drug Use: No  . Sexual Activity: No   Other Topics Concern  . Not on file   Social History Narrative   [employer], [employer] school, AA degree [employer]. Married '64- 21 years/divorced; married '98. No children. 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    Current Outpatient Prescriptions on File Prior to Visit  Medication Sig Dispense Refill  . aspirin 81 MG tablet Take 81 mg by mouth daily.       Marland Kitchen diltiazem (CARDIZEM CD) 180 MG 24 hr capsule Take 1 capsule (180 mg total) by mouth daily.  90 capsule  1  . fluocinonide gel (LIDEX) 0.05 % Apply 1 application topically 2 (two) times daily as needed. for oral ulcers       . gabapentin (NEURONTIN) 300 MG capsule Take 1 capsule (300 mg total) by mouth 2 (two) times daily.  180 capsule  3  . HYDROcodone-acetaminophen (NORCO) 10-325 MG per tablet Take 1 tablet by mouth every 6 (six) hours as needed. For pain  60 tablet  2  . hydrocortisone (ANUSOL-HC) 2.5 % rectal cream Place rectally as needed for hemorrhoids.  30 g  0  . hydrocortisone (ANUSOL-HC) 25 MG suppository Place 1 suppository (25 mg total) rectally 2 (two) times daily.  12 suppository  2  . methocarbamol (ROBAXIN) 500 MG tablet Take 1 tablet (500 mg total) by mouth 4 (four) times daily.  120 tablet  2  . Multiple Vitamin (MULTIVITAMIN WITH MINERALS) TABS Take 1 tablet by mouth daily.      Marland Kitchen olmesartan-hydrochlorothiazide (BENICAR HCT) 40-25 MG per tablet Take 1 tablet by mouth daily.  90 tablet  3  . omeprazole (PRILOSEC) 40  MG capsule Take 1 capsule by mouth  daily  90 capsule  0  . Sennosides-Docusate Sodium (STOOL SOFTENER LAXATIVE PO) Take by mouth as needed.      . simvastatin (ZOCOR) 20 MG tablet Take one tablet by mouth in the evenings three times weekly (Mon, Wed, Fri).  90 tablet  1  . temazepam (RESTORIL) 15 MG capsule Take 15 mg by mouth at bedtime as needed.       Marland Kitchen  meloxicam (MOBIC) 15 MG tablet Take 15 mg by mouth as needed.      . [DISCONTINUED] diltiazem (DILACOR XR) 180 MG 24 hr capsule Take 1 capsule (180 mg total) by mouth daily.  30 capsule  1   No current facility-administered medications on file prior to visit.      Review of Systems Constitutional:  Negative for fever, chills, activity change and unexpected weight change.  HEENT:  Negative for hearing loss, ear pain, congestion, neck stiffness and postnasal drip. Negative for sore throat or swallowing problems. Negative for dental complaints.   Eyes: Negative for vision loss or change in visual acuity.  Respiratory: Negative for chest tightness and wheezing. Negative for DOE.   Cardiovascular: Negative for chest pain or palpitations. No decreased exercise tolerance Gastrointestinal: No change in bowel habit. No bloating or gas. No reflux or indigestion Genitourinary: Negative for urgency, flank pain and difficulty urinating. Nocturia x 3 Musculoskeletal: Positive for myalgias, back pain, arthralgias and gait problem.  Neurological: Negative for dizziness, tremors, weakness and headaches.  Hematological: Negative for adenopathy.  Psychiatric/Behavioral: Negative for behavioral problems and dysphoric mood.       Objective:   Physical Exam Filed Vitals:   04/09/13 1432  BP: 130/72  Pulse: 66  Temp: 97.6 F (36.4 C)   Wt Readings from Last 3 Encounters:  04/09/13 176 lb 12.8 oz (80.196 kg)  02/12/13 176 lb (79.833 kg)  01/22/13 177 lb 1.9 oz (80.341 kg)   Gen'l: well nourished, well developed Woman in no distress HEENT -  [place]/AT, EACs/TMs normal, oropharynx with native dentition in good condition, no buccal or palatal lesions, posterior pharynx clear, mucous membranes moist. C&S clear, PERRLA, fundi - normal Neck - supple, no thyromegaly Nodes- negative submental, cervical, supraclavicular regions Chest - no deformity, no CVAT Lungs - clear without rales, wheezes. No increased work of breathing Breast - - Skin normal, nipples w/o discharge, no fixed mass or lesion, no axillary adenopathy. Cardiovascular - regular rate and rhythm, quiet precordium, no murmurs, rubs or gallops, 2+ radial, DP and PT pulses Abdomen - BS+ x 4, no HSM, no guarding or rebound or tenderness Pelvic - NEG/BUS normal, vaginal mucosa with mild atrophic appearance, Cervix close. PAP performed. Rectal - deferred to GI Extremities - no clubbing, cyanosis, edema or deformity.  Neuro - A&O x 3, CN II-XII normal, motor strength normal and equal, DTRs 2+ and symmetrical biceps, radial, and patellar tendons. Cerebellar - no tremor, no rigidity, fluid movement and normal gait. Derm - Head, neck, back, abdomen and extremities without suspicious lesions  Recent Results (from the past 2160 hour(s))  HEPATIC FUNCTION PANEL     Status: None   Collection Time    04/09/13  3:46 PM      Result Value Range   Total Bilirubin 0.3  0.3 - 1.2 mg/dL   Bilirubin, Direct 0.0  0.0 - 0.3 mg/dL   Alkaline Phosphatase 66  39 - 117 U/L   AST 22  0 - 37 U/L   ALT 17  0 - 35 U/L   Total Protein 6.8  6.0 - 8.3 g/dL   Albumin 4.1  3.5 - 5.2 g/dL  TSH     Status: None   Collection Time    04/09/13  3:46 PM      Result Value Range   TSH 1.01  0.35 - 5.50 uIU/mL  COMPREHENSIVE METABOLIC PANEL     Status: Abnormal   Collection Time    04/09/13  3:46 PM      Result Value Range   Sodium 136  135 - 145 mEq/L   Potassium 3.5  3.5 - 5.1 mEq/L   Chloride 102  96 - 112 mEq/L   CO2 29  19 - 32 mEq/L   Glucose, Bld 95  70 - 99 mg/dL   BUN 22  6 - 23 mg/dL   Creatinine,  Ser 1.3 (*) 0.4 - 1.2 mg/dL   Total Bilirubin 0.3  0.3 - 1.2 mg/dL   Alkaline Phosphatase 66  39 - 117 U/L   AST 22  0 - 37 U/L   ALT 17  0 - 35 U/L   Total Protein 6.8  6.0 - 8.3 g/dL   Albumin 4.1  3.5 - 5.2 g/dL   Calcium 9.3  8.4 - 16.1 mg/dL   GFR 09.60 (*) >45.40 mL/min  LIPID PANEL     Status: Abnormal   Collection Time    04/09/13  3:46 PM      Result Value Range   Cholesterol 197  0 - 200 mg/dL   Comment: ATP III Classification       Desirable:  < 200 mg/dL               Borderline High:  200 - 239 mg/dL          High:  > = 981 mg/dL   Triglycerides 191.4  0.0 - 149.0 mg/dL   Comment: Normal:  <782 mg/dLBorderline High:  150 - 199 mg/dL   HDL 95.62  >13.08 mg/dL   VLDL 65.7  0.0 - 84.6 mg/dL   LDL Cholesterol 962 (*) 0 - 99 mg/dL   Total CHOL/HDL Ratio 3     Comment:                Men          Women1/2 Average Risk     3.4          3.3Average Risk          5.0          4.42X Average Risk          9.6          7.13X Average Risk          15.0          11.0                      HEMOGLOBIN AND HEMATOCRIT, BLOOD     Status: None   Collection Time    04/09/13  3:46 PM      Result Value Range   Hemoglobin 13.0  12.0 - 15.0 g/dL   HCT 95.2  84.1 - 32.4 %  VITAMIN B12     Status: None   Collection Time    04/09/13  3:46 PM      Result Value Range   Vitamin B-12 265  211 - 911 pg/mL         Assessment & Plan:

## 2013-04-09 NOTE — Assessment & Plan Note (Signed)
Lab reveals LDL that is at goal of less than 130 and close to 100. Liver function is normal  Plan Continue present meds.

## 2013-04-09 NOTE — Assessment & Plan Note (Signed)
Interval history is benign. Physical exam w/ pelvic is normal.She is current with colorectal and breast cancer. Normal pelvic, PAP pending. Lab results are in normal range. She is current with immunizations but she does not take the flu vaccine. She is encouraged to loose weight through exercise and calorie restriction.  In summary A nice woman who appears medically stable.

## 2013-04-09 NOTE — Assessment & Plan Note (Signed)
BP Readings from Last 3 Encounters:  04/09/13 130/72  02/12/13 102/60  01/22/13 122/68   Good control on present regimen

## 2013-04-09 NOTE — Assessment & Plan Note (Signed)
No change in condition.

## 2013-04-09 NOTE — Patient Instructions (Addendum)
Thanks for coming to see me.  Your exam is normal and this was the last PAP.  Routine labs are ordered. Please sign up for Mychart to access results or a letter will be sent in about a week.   Come back in a year or sooner as needed.

## 2013-04-22 DIAGNOSIS — M5430 Sciatica, unspecified side: Secondary | ICD-10-CM | POA: Diagnosis not present

## 2013-04-22 DIAGNOSIS — M999 Biomechanical lesion, unspecified: Secondary | ICD-10-CM | POA: Diagnosis not present

## 2013-04-23 ENCOUNTER — Other Ambulatory Visit: Payer: Self-pay | Admitting: Internal Medicine

## 2013-05-02 DIAGNOSIS — M899 Disorder of bone, unspecified: Secondary | ICD-10-CM | POA: Diagnosis not present

## 2013-05-16 ENCOUNTER — Other Ambulatory Visit: Payer: Self-pay

## 2013-05-22 ENCOUNTER — Encounter: Payer: Self-pay | Admitting: Internal Medicine

## 2013-05-29 ENCOUNTER — Encounter: Payer: Self-pay | Admitting: Internal Medicine

## 2013-05-30 ENCOUNTER — Ambulatory Visit (INDEPENDENT_AMBULATORY_CARE_PROVIDER_SITE_OTHER): Payer: Medicare Other | Admitting: Internal Medicine

## 2013-05-30 ENCOUNTER — Encounter: Payer: Self-pay | Admitting: Internal Medicine

## 2013-05-30 VITALS — BP 130/62 | HR 72 | Temp 99.2°F | Wt 178.8 lb

## 2013-05-30 DIAGNOSIS — J029 Acute pharyngitis, unspecified: Secondary | ICD-10-CM

## 2013-05-30 IMAGING — CT CT ABD-PELV W/ CM
1 of 3 series · 14 of 32 positions shown, 19 images · IV contrast (OMNIPAQUE 300)
Comparison: 09/27/2004

CLINICAL DATA: Abdominal pain, elevated lipase.

CT ABDOMEN AND PELVIS WITH CONTRAST
TECHNIQUE: Multidetector CT imaging of the abdomen and pelvis was
performed following the standard protocol during bolus
administration of intravenous contrast.
Contrast: 100mL OMNIPAQUE IOHEXOL 300 MG/ML  SOLN

[Series 2: abd/pel with · axial · 0.73mm/px · z∈[+806,+1176]mm · 14 of 84 slices shown, 19 images]
[im 5/84  soft-tissue]
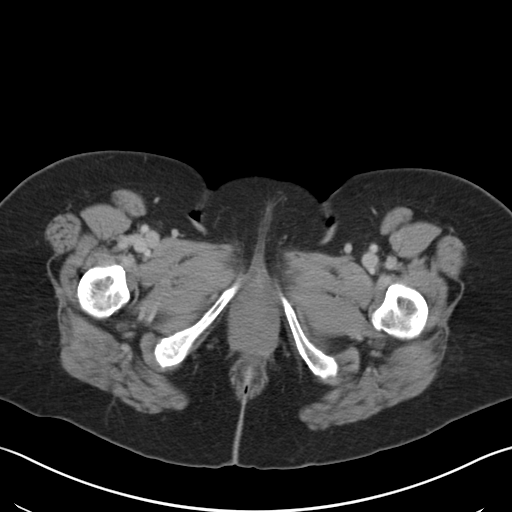
[im 5/84  bone]
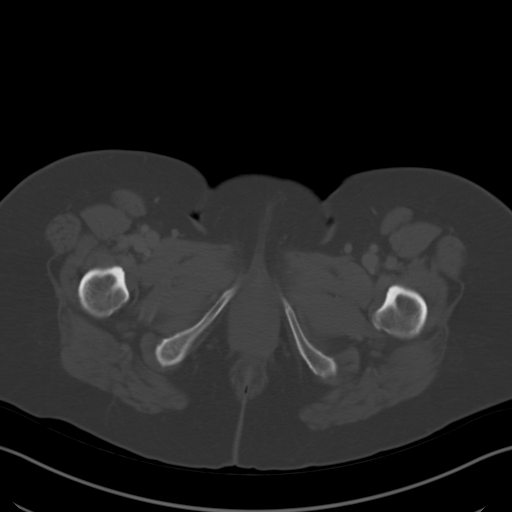
[im 14/84  soft-tissue]
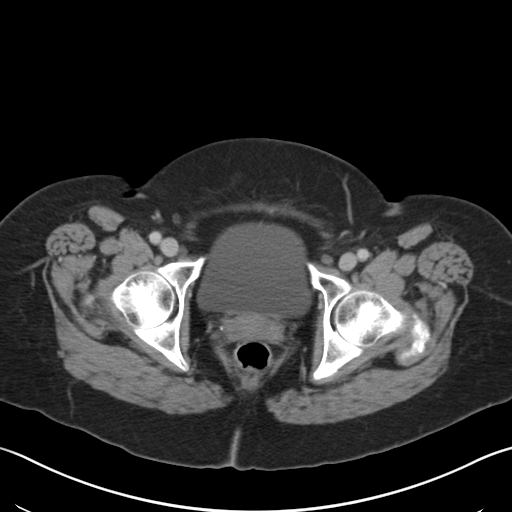
[im 18/84  soft-tissue]
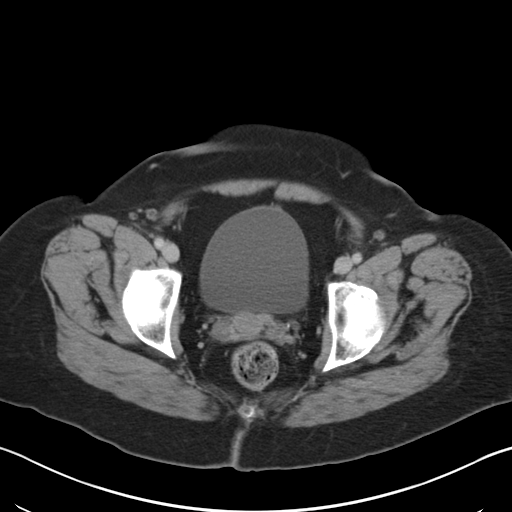
[im 22/84  soft-tissue]
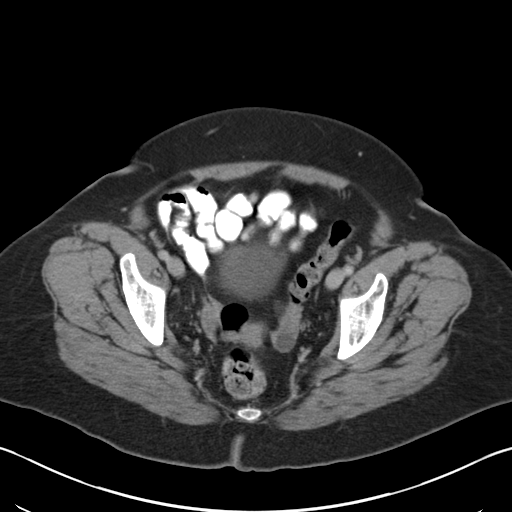
[im 31/84  soft-tissue]
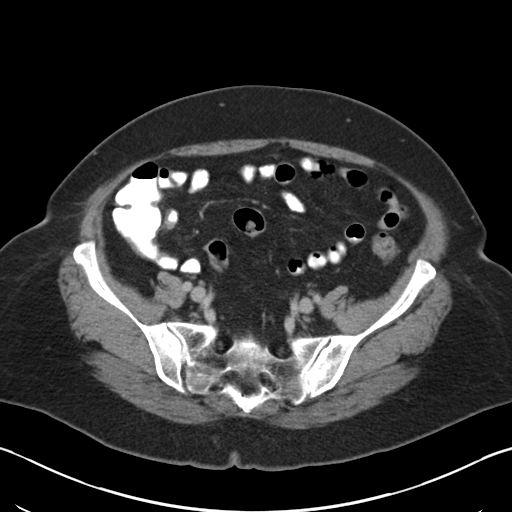
[im 35/84  soft-tissue]
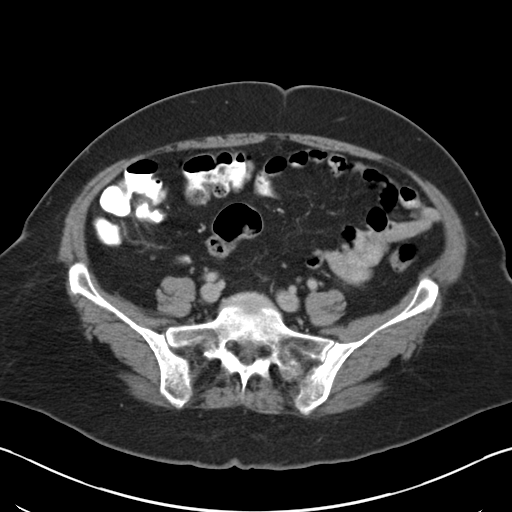
[im 44/84  soft-tissue]
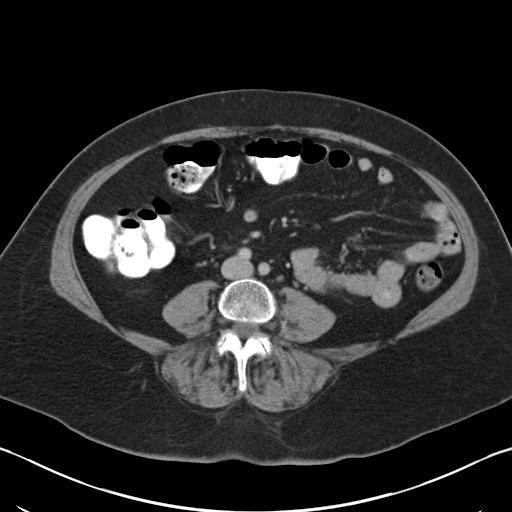
[im 49/84  soft-tissue]
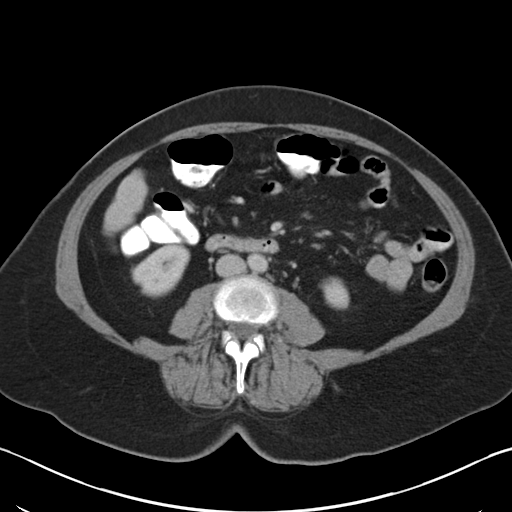
[im 53/84  soft-tissue]
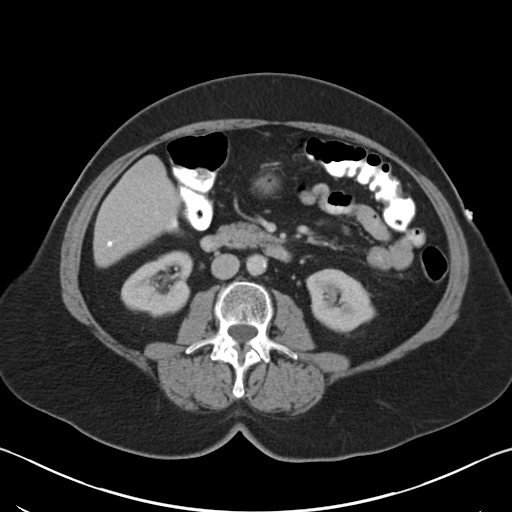
[im 53/84  bone]
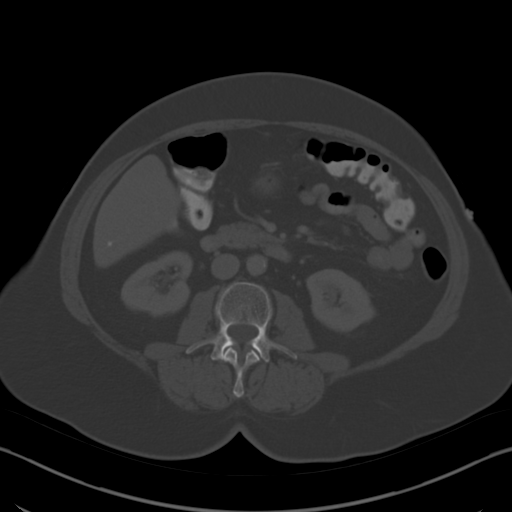
[im 62/84  soft-tissue]
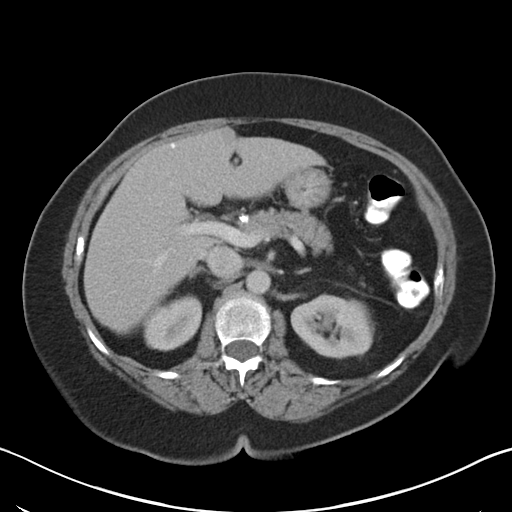
[im 66/84  soft-tissue]
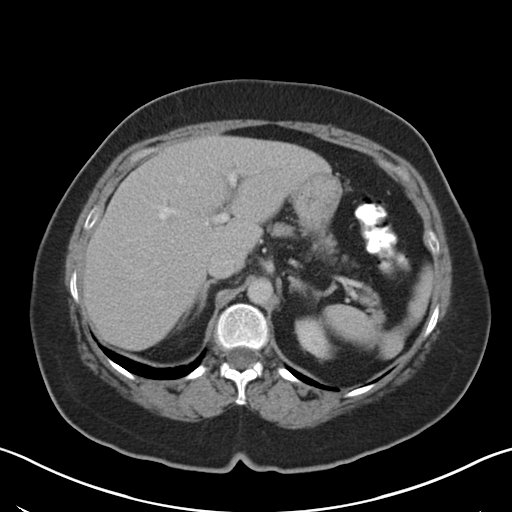
[im 66/84  lung]
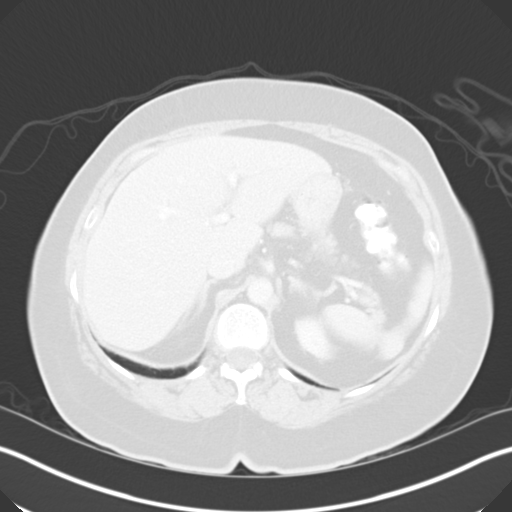
[im 70/84  soft-tissue]
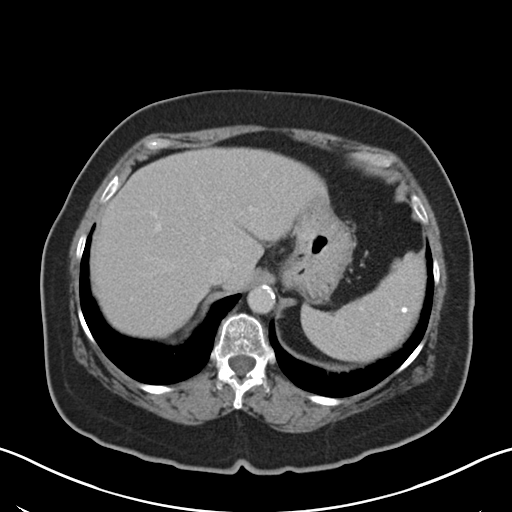
[im 70/84  lung]
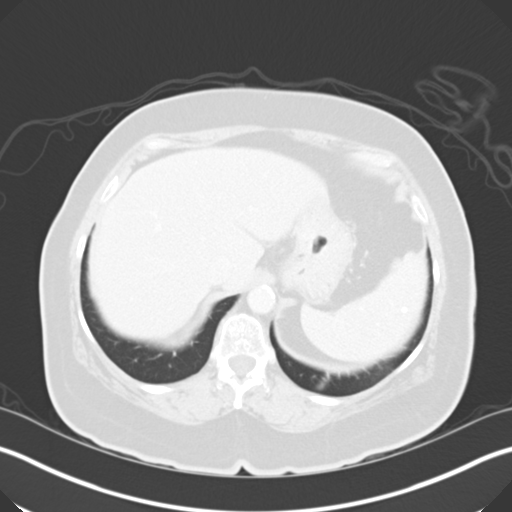
[im 75/84  lung]
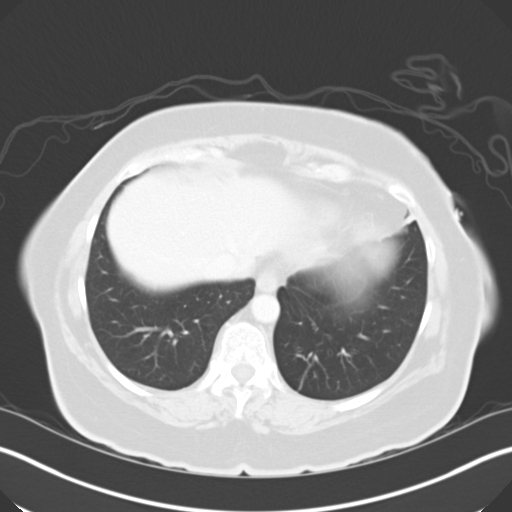
[im 79/84  soft-tissue]
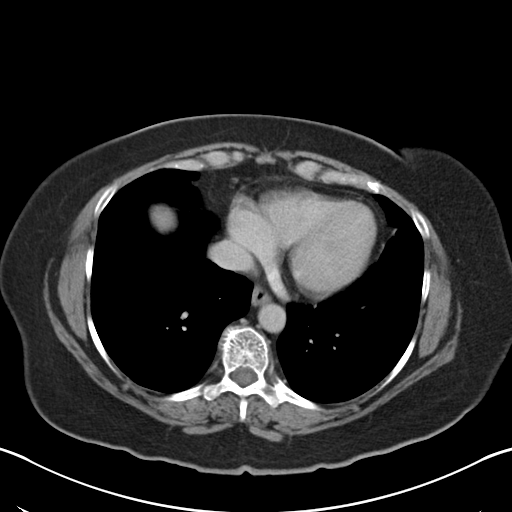
[im 79/84  lung]
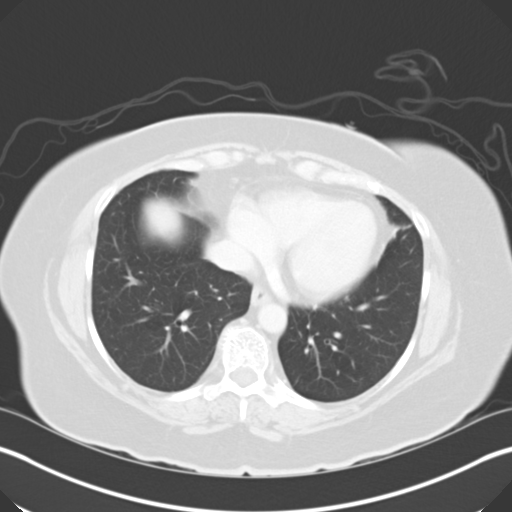

[14 of 32 positions shown; findings below may reference images not displayed]

FINDINGS: Mitral annular calcification.  Trace pericardial fluid.
Heart size within normal limits.  No pleural effusion.  Mild linear
opacity within the left lower lobe and lingula, likely scarring or
atelectasis.

Several hepatic and splenic calcifications are most in keeping with
sequelae of prior granulomas infection.  Otherwise homogeneous
hepatic and splenic enhancement.  Unremarkable biliary system,
pancreas, adrenal glands.

Symmetric renal enhancement.  No hydronephrosis or hydroureter.  No
urinary tract calculi.

No bowel obstruction.  No CT evidence for colitis.  Appendix not
identified.  No right lower quadrant inflammation.  No free
intraperitoneal air or fluid.  Calcified porta hepatis lymph nodes.
No enlarged abdominopelvic lymph nodes.

There is scattered atherosclerotic calcification of the aorta and
its branches. No aneurysmal dilatation.

Thin-walled bladder.  CT appearance to the uterus within normal
limits.  There is a 12 mm left adnexal cyst, nonspecific.

Multilevel degenerative changes of the imaged spine. No acute or
aggressive appearing osseous lesion.
IMPRESSION: No acute abdominopelvic process identified.  No pancreatic
abnormality identified despite the elevated lipase.

Sequelae of prior granulomas infection with hepatic, splenic, and
port hepatis lymph node calcifications.

12 mm left adnexal cyst is nonspecific.  Consider non emergent
pelvic ultrasound follow-up.

## 2013-05-30 MED ORDER — AMOXICILLIN 875 MG PO TABS: 875.0000 mg | ORAL_TABLET | Freq: Two times a day (BID) | ORAL | Status: DC

## 2013-05-30 MED ORDER — LIDOCAINE VISCOUS 2 % MT SOLN: 20.0000 mL | OROMUCOSAL | Status: DC | PRN

## 2013-05-30 NOTE — Progress Notes (Signed)
Subjective:    Patient ID: [name], female    DOB: [birth year], 67 y.o.   MRN: [id number]  HPI [name] presents for a 48 hr h/o sore throat, to the point of odynophagia and inability to ear. Can tolerate ice-wate or juice. No fever, mild chills. No cough. No N/V/rhinorrhea.  PMH, FamHx and SocHx reviewed for any changes and relevance.  Current Outpatient Prescriptions on File Prior to Visit  Medication Sig Dispense Refill  . aspirin 81 MG tablet Take 81 mg by mouth daily.       Marland Kitchen diltiazem (CARDIZEM CD) 180 MG 24 hr capsule Take 1 capsule (180 mg total) by mouth daily.  90 capsule  1  . fluocinonide gel (LIDEX) 0.05 % Apply 1 application topically 2 (two) times daily as needed. for oral ulcers       . gabapentin (NEURONTIN) 300 MG capsule Take 1 capsule by mouth  twice daily  180 capsule  3  . HYDROcodone-acetaminophen (NORCO) 10-325 MG per tablet Take 1 tablet by mouth every 6 (six) hours as needed. For pain  60 tablet  2  . hydrocortisone (ANUSOL-HC) 2.5 % rectal cream Place rectally as needed for hemorrhoids.  30 g  0  . hydrocortisone (ANUSOL-HC) 25 MG suppository Place 1 suppository (25 mg total) rectally 2 (two) times daily.  12 suppository  2  . meloxicam (MOBIC) 15 MG tablet Take 15 mg by mouth as needed.      . methocarbamol (ROBAXIN) 500 MG tablet Take 1 tablet (500 mg total) by mouth 4 (four) times daily.  120 tablet  2  . Multiple Vitamin (MULTIVITAMIN WITH MINERALS) TABS Take 1 tablet by mouth daily.      Marland Kitchen olmesartan-hydrochlorothiazide (BENICAR HCT) 40-25 MG per tablet Take 1 tablet by mouth daily.  90 tablet  3  . omeprazole (PRILOSEC) 40 MG capsule Take 1 capsule by mouth   daily  90 capsule  3  . Sennosides-Docusate Sodium (STOOL SOFTENER LAXATIVE PO) Take by mouth as needed.      . simvastatin (ZOCOR) 20 MG tablet Take one tablet by mouth in the evenings three times weekly (Mon, Wed, Fri).  90 tablet  1  . temazepam (RESTORIL) 15 MG capsule Take 1 capsule  (15 mg total) by mouth at bedtime as needed.  30 capsule  5  . [DISCONTINUED] diltiazem (DILACOR XR) 180 MG 24 hr capsule Take 1 capsule (180 mg total) by mouth daily.  30 capsule  1   No current facility-administered medications on file prior to visit.      Review of Systems System review is negative for any constitutional, cardiac, pulmonary, GI or neuro symptoms or complaints other than as described in the HPI.     Objective:   Physical Exam Filed Vitals:   05/30/13 1657  BP: 130/62  Pulse: 72  Temp: 99.2 F (37.3 C)   Wt Readings from Last 3 Encounters:  05/30/13 178 lb 12.8 oz (81.103 kg)  04/09/13 176 lb 12.8 oz (80.196 kg)  02/12/13 176 lb (79.833 kg)   Gen'l WNWD white woman in no distress HEENT - Throat without erythema or exudate, no swelling. Neck- supple Nodes - negative Chest - CTAP      Assessment & Plan:  Sore Throat (pharyngitis) - no obvious signs of bacterial infection on exam - most likely viral in nature.  Plan - to be sure there is not a bacterial infection Amoxicillin twice a day for 7 days but it may  not make a big difference if it is viral  Viscous Xylocain - mix 1:1 with water, gargle and spit as needed. Care to avoid scalding or tongue biting.

## 2013-05-30 NOTE — Patient Instructions (Signed)
Sore Throat (pharyngitis) - no obvious signs of bacterial infection on exam - most likely viral in nature.  Plan - to be sure there is not a bacterial infection Amoxicillin twice a day for 7 days but it may not make a big difference if it is viral  Viscous Xylocain - mix 1:1 with water, gargle and spit as needed. Care to avoid scalding or tongue biting.      Sore Throat A sore throat is pain, burning, irritation, or scratchiness of the throat. There is often pain or tenderness when swallowing or talking. A sore throat may be accompanied by other symptoms, such as coughing, sneezing, fever, and swollen neck glands. A sore throat is often the first sign of another sickness, such as a cold, flu, strep throat, or mononucleosis (commonly known as mono). Most sore throats go away without medical treatment. CAUSES  The most common causes of a sore throat include:  A viral infection, such as a cold, flu, or mono.  A bacterial infection, such as strep throat, tonsillitis, or whooping cough.  Seasonal allergies.  Dryness in the air.  Irritants, such as smoke or pollution.  Gastroesophageal reflux disease (GERD). HOME CARE INSTRUCTIONS   Only take over-the-counter medicines as directed by your caregiver.  Drink enough fluids to keep your urine clear or pale yellow.  Rest as needed.  Try using throat sprays, lozenges, or sucking on hard candy to ease any pain (if older than 4 years or as directed).  Sip warm liquids, such as broth, herbal tea, or warm water with honey to relieve pain temporarily. You may also eat or drink cold or frozen liquids such as frozen ice pops.  Gargle with salt water (mix 1 tsp salt with 8 oz of water).  Do not smoke and avoid secondhand smoke.  Put a cool-mist humidifier in your bedroom at night to moisten the air. You can also turn on a hot shower and sit in the bathroom with the door closed for 5 10 minutes. SEEK IMMEDIATE MEDICAL CARE IF:  You have  difficulty breathing.  You are unable to swallow fluids, soft foods, or your saliva.  You have increased swelling in the throat.  Your sore throat does not get better in 7 days.  You have nausea and vomiting.  You have a fever or persistent symptoms for more than 2 3 days.  You have a fever and your symptoms suddenly get worse. MAKE SURE YOU:   Understand these instructions.  Will watch your condition.  Will get help right away if you are not doing well or get worse. Document Released: 08/04/2004 Document Revised: 06/13/2012 Document Reviewed: 03/04/2012 [facility] Patient Information 2014 [place], [place].

## 2013-05-30 NOTE — Progress Notes (Signed)
Pre visit review using our clinic review tool, if applicable. No additional management support is needed unless otherwise documented below in the visit note. 

## 2013-06-10 DIAGNOSIS — M19079 Primary osteoarthritis, unspecified ankle and foot: Secondary | ICD-10-CM | POA: Diagnosis not present

## 2013-06-19 DIAGNOSIS — M19079 Primary osteoarthritis, unspecified ankle and foot: Secondary | ICD-10-CM | POA: Diagnosis not present

## 2013-06-20 DIAGNOSIS — M999 Biomechanical lesion, unspecified: Secondary | ICD-10-CM | POA: Diagnosis not present

## 2013-06-20 DIAGNOSIS — M5430 Sciatica, unspecified side: Secondary | ICD-10-CM | POA: Diagnosis not present

## 2013-07-12 DIAGNOSIS — H2510 Age-related nuclear cataract, unspecified eye: Secondary | ICD-10-CM | POA: Diagnosis not present

## 2013-07-17 ENCOUNTER — Other Ambulatory Visit: Payer: Self-pay | Admitting: Internal Medicine

## 2013-07-17 DIAGNOSIS — H2510 Age-related nuclear cataract, unspecified eye: Secondary | ICD-10-CM | POA: Diagnosis not present

## 2013-07-17 MED ORDER — DILTIAZEM HCL ER COATED BEADS 180 MG PO CP24: 180.0000 mg | ORAL_CAPSULE | Freq: Every day | ORAL | Status: DC

## 2013-07-17 MED ORDER — OLMESARTAN MEDOXOMIL-HCTZ 40-25 MG PO TABS: 1.0000 | ORAL_TABLET | Freq: Every day | ORAL | Status: DC

## 2013-07-22 ENCOUNTER — Encounter: Payer: Self-pay | Admitting: Internal Medicine

## 2013-07-22 ENCOUNTER — Ambulatory Visit (INDEPENDENT_AMBULATORY_CARE_PROVIDER_SITE_OTHER): Payer: Medicare Other | Admitting: Internal Medicine

## 2013-07-22 VITALS — BP 140/90 | HR 71 | Temp 97.1°F | Wt 179.0 lb

## 2013-07-22 DIAGNOSIS — IMO0001 Reserved for inherently not codable concepts without codable children: Secondary | ICD-10-CM

## 2013-07-22 NOTE — Patient Instructions (Signed)
No sign of infection or any problem interferes with planned cataract surgery. This is most likely your old friend fibro.  For eructation (belching) try Gas-X. For flatus Beano can be very helpful. Also watching the hard to digest fibers.

## 2013-07-22 NOTE — Progress Notes (Signed)
Pre visit review using our clinic review tool, if applicable. No additional management support is needed unless otherwise documented below in the visit note. 

## 2013-07-22 NOTE — Progress Notes (Signed)
Subjective:    Patient ID: [name], female    DOB: 07-04-1946, 68 y.o.   MRN: [id number]  HPI For a couple of weeks she has had diffuse pain radiating from the mid-back to anterior abdomen alternating from one side to the other. No dysuria, no hematuria, pain level is 3-4/10. Her pain did respond to topical rub. She does have fibromyalgia. No cardiac symptoms and no respiratory symptoms  PMH, FamHx and SocHx reviewed for any changes and relevance. Current Outpatient Prescriptions on File Prior to Visit  Medication Sig Dispense Refill  . aspirin 81 MG tablet Take 81 mg by mouth daily.       . citalopram (CELEXA) 20 MG tablet Take 1 tablet by mouth  daily  30 tablet  11  . diltiazem (CARDIZEM CD) 180 MG 24 hr capsule Take 1 capsule (180 mg total) by mouth daily.  90 capsule  3  . fluocinonide gel (LIDEX) 3.71 % Apply 1 application topically 2 (two) times daily as needed. for oral ulcers       . gabapentin (NEURONTIN) 300 MG capsule Take 1 capsule by mouth  twice daily  180 capsule  3  . HYDROcodone-acetaminophen (NORCO) 10-325 MG per tablet Take 1 tablet by mouth every 6 (six) hours as needed. For pain  60 tablet  2  . hydrocortisone (ANUSOL-HC) 2.5 % rectal cream Place rectally as needed for hemorrhoids.  30 g  0  . hydrocortisone (ANUSOL-HC) 25 MG suppository Place 1 suppository (25 mg total) rectally 2 (two) times daily.  12 suppository  2  . lidocaine (XYLOCAINE) 2 % solution Use as directed 20 mLs in the mouth or throat as needed for mouth pain.  100 mL  0  . meloxicam (MOBIC) 15 MG tablet Take 15 mg by mouth as needed.      . methocarbamol (ROBAXIN) 500 MG tablet Take 1 tablet (500 mg total) by mouth 4 (four) times daily.  120 tablet  2  . Multiple Vitamin (MULTIVITAMIN WITH MINERALS) TABS Take 1 tablet by mouth daily.      Marland Kitchen olmesartan-hydrochlorothiazide (BENICAR HCT) 40-25 MG per tablet Take 1 tablet by mouth daily.  90 tablet  3  . omeprazole (PRILOSEC) 40 MG capsule Take 1  capsule by mouth   daily  90 capsule  3  . Sennosides-Docusate Sodium (STOOL SOFTENER LAXATIVE PO) Take by mouth as needed.      . simvastatin (ZOCOR) 20 MG tablet Take one tablet by mouth in the evenings three times weekly (Mon, Wed, Fri).  90 tablet  1  . temazepam (RESTORIL) 15 MG capsule Take 1 capsule (15 mg total) by mouth at bedtime as needed.  30 capsule  5  . [DISCONTINUED] diltiazem (DILACOR XR) 180 MG 24 hr capsule Take 1 capsule (180 mg total) by mouth daily.  30 capsule  1   No current facility-administered medications on file prior to visit.      Review of Systems System review is negative for any constitutional, cardiac, pulmonary, GI or neuro symptoms or complaints other than as described in the HPI.     Objective:   Physical Exam Filed Vitals:   07/22/13 1641  BP: 140/90  Pulse: 71  Temp: 97.1 F (36.2 C)   Wt Readings from Last 3 Encounters:  07/22/13 179 lb (81.194 kg)  05/30/13 178 lb 12.8 oz (81.103 kg)  04/09/13 176 lb 12.8 oz (80.196 kg)   Gen'l - overweight nicely groomed woman in no distress  HEENT - C&S clear Cor- 2+ radial, RRR Pulm - CTAP, normal respirations Abd - BS+, no G/R/tenderness MSK - no CVAT, minimal Flank tenderness to palpation       Assessment & Plan:  OK for cataract surgery

## 2013-07-23 NOTE — Assessment & Plan Note (Signed)
Intermittent, migrating soft tissue pain flanks with radiation to abdomen. Negative exam. No associated symptoms or signs to suggest any infection, cardiac or pulmonary problems  Plan Reassurance  Clear for cataract surgery.

## 2013-07-24 ENCOUNTER — Ambulatory Visit: Payer: Self-pay | Admitting: Ophthalmology

## 2013-07-24 DIAGNOSIS — Z885 Allergy status to narcotic agent status: Secondary | ICD-10-CM | POA: Diagnosis not present

## 2013-07-24 DIAGNOSIS — E7800 Pure hypercholesterolemia, unspecified: Secondary | ICD-10-CM | POA: Diagnosis not present

## 2013-07-24 DIAGNOSIS — IMO0002 Reserved for concepts with insufficient information to code with codable children: Secondary | ICD-10-CM | POA: Diagnosis not present

## 2013-07-24 DIAGNOSIS — K219 Gastro-esophageal reflux disease without esophagitis: Secondary | ICD-10-CM | POA: Diagnosis not present

## 2013-07-24 DIAGNOSIS — M1289 Other specific arthropathies, not elsewhere classified, multiple sites: Secondary | ICD-10-CM | POA: Diagnosis not present

## 2013-07-24 DIAGNOSIS — Z79899 Other long term (current) drug therapy: Secondary | ICD-10-CM | POA: Diagnosis not present

## 2013-07-24 DIAGNOSIS — Z888 Allergy status to other drugs, medicaments and biological substances status: Secondary | ICD-10-CM | POA: Diagnosis not present

## 2013-07-24 DIAGNOSIS — H259 Unspecified age-related cataract: Secondary | ICD-10-CM | POA: Diagnosis not present

## 2013-07-24 DIAGNOSIS — Z87891 Personal history of nicotine dependence: Secondary | ICD-10-CM | POA: Diagnosis not present

## 2013-07-24 DIAGNOSIS — F329 Major depressive disorder, single episode, unspecified: Secondary | ICD-10-CM | POA: Diagnosis not present

## 2013-07-24 DIAGNOSIS — H2510 Age-related nuclear cataract, unspecified eye: Secondary | ICD-10-CM | POA: Diagnosis not present

## 2013-07-24 DIAGNOSIS — F3289 Other specified depressive episodes: Secondary | ICD-10-CM | POA: Diagnosis not present

## 2013-07-24 DIAGNOSIS — K449 Diaphragmatic hernia without obstruction or gangrene: Secondary | ICD-10-CM | POA: Diagnosis not present

## 2013-07-24 DIAGNOSIS — G5790 Unspecified mononeuropathy of unspecified lower limb: Secondary | ICD-10-CM | POA: Diagnosis not present

## 2013-07-24 DIAGNOSIS — M129 Arthropathy, unspecified: Secondary | ICD-10-CM | POA: Diagnosis not present

## 2013-07-24 DIAGNOSIS — I10 Essential (primary) hypertension: Secondary | ICD-10-CM | POA: Diagnosis not present

## 2013-09-04 DIAGNOSIS — M5430 Sciatica, unspecified side: Secondary | ICD-10-CM | POA: Diagnosis not present

## 2013-09-04 DIAGNOSIS — M999 Biomechanical lesion, unspecified: Secondary | ICD-10-CM | POA: Diagnosis not present

## 2013-09-06 MED ORDER — HYDROCORTISONE 2.5 % RE CREA: TOPICAL_CREAM | RECTAL | Status: DC | PRN

## 2013-10-15 DIAGNOSIS — M19079 Primary osteoarthritis, unspecified ankle and foot: Secondary | ICD-10-CM | POA: Diagnosis not present

## 2013-10-16 NOTE — Progress Notes (Signed)
   Subjective:    Patient ID: [name], female    DOB: [birth year], 68 y.o.   MRN: [id number]  HPI Cancelled appointment - no charge   Review of Systems     Objective:   Physical Exam        Assessment & Plan:

## 2013-10-29 ENCOUNTER — Other Ambulatory Visit (HOSPITAL_COMMUNITY): Payer: Self-pay | Admitting: Cardiology

## 2013-10-29 DIAGNOSIS — I6529 Occlusion and stenosis of unspecified carotid artery: Secondary | ICD-10-CM

## 2013-11-04 ENCOUNTER — Ambulatory Visit (HOSPITAL_COMMUNITY): Payer: Medicare Other | Attending: Cardiology | Admitting: Cardiology

## 2013-11-04 DIAGNOSIS — I6529 Occlusion and stenosis of unspecified carotid artery: Secondary | ICD-10-CM | POA: Diagnosis not present

## 2013-11-04 NOTE — Progress Notes (Signed)
Carotid duplex complete 

## 2013-11-06 ENCOUNTER — Encounter: Payer: Self-pay | Admitting: Cardiology

## 2013-11-07 ENCOUNTER — Telehealth: Payer: Self-pay | Admitting: Cardiology

## 2013-11-07 ENCOUNTER — Other Ambulatory Visit: Payer: Self-pay

## 2013-11-07 DIAGNOSIS — I779 Disorder of arteries and arterioles, unspecified: Secondary | ICD-10-CM

## 2013-11-07 NOTE — Telephone Encounter (Signed)
The pt is given her Carotid duplex results, she verbalized understanding.

## 2013-11-07 NOTE — Telephone Encounter (Signed)
°  Patient is returning your call, please call back.  °

## 2013-11-08 ENCOUNTER — Telehealth: Payer: Self-pay

## 2013-11-08 NOTE — Telephone Encounter (Signed)
The patient called and is hoping to get her medication refilled.  She is establishing with Dr.Kollar in the fall.  She is in need of her Restoril 15mg  for sleeping.   Callback - [phone]

## 2013-11-11 MED ORDER — TEMAZEPAM 15 MG PO CAPS: 15.0000 mg | ORAL_CAPSULE | Freq: Every evening | ORAL | Status: DC | PRN

## 2013-11-11 NOTE — Telephone Encounter (Signed)
Spoke with pt advised Rx ready. 

## 2013-11-11 NOTE — Telephone Encounter (Signed)
OK to fill this prescription with additional refills x0. Move up appt w/Dr Doug Sou pls Thank you!

## 2013-12-09 DIAGNOSIS — M5430 Sciatica, unspecified side: Secondary | ICD-10-CM | POA: Diagnosis not present

## 2013-12-09 DIAGNOSIS — M999 Biomechanical lesion, unspecified: Secondary | ICD-10-CM | POA: Diagnosis not present

## 2013-12-13 ENCOUNTER — Ambulatory Visit (INDEPENDENT_AMBULATORY_CARE_PROVIDER_SITE_OTHER): Payer: Medicare Other | Admitting: Family Medicine

## 2013-12-13 ENCOUNTER — Encounter: Payer: Self-pay | Admitting: Family Medicine

## 2013-12-13 VITALS — BP 132/72 | HR 77 | Temp 98.6°F | Ht 61.25 in | Wt 177.8 lb

## 2013-12-13 DIAGNOSIS — G4700 Insomnia, unspecified: Secondary | ICD-10-CM | POA: Diagnosis not present

## 2013-12-13 DIAGNOSIS — I6529 Occlusion and stenosis of unspecified carotid artery: Secondary | ICD-10-CM

## 2013-12-13 DIAGNOSIS — G609 Hereditary and idiopathic neuropathy, unspecified: Secondary | ICD-10-CM

## 2013-12-13 DIAGNOSIS — K219 Gastro-esophageal reflux disease without esophagitis: Secondary | ICD-10-CM

## 2013-12-13 DIAGNOSIS — E785 Hyperlipidemia, unspecified: Secondary | ICD-10-CM

## 2013-12-13 DIAGNOSIS — IMO0001 Reserved for inherently not codable concepts without codable children: Secondary | ICD-10-CM

## 2013-12-13 DIAGNOSIS — F3289 Other specified depressive episodes: Secondary | ICD-10-CM

## 2013-12-13 DIAGNOSIS — I10 Essential (primary) hypertension: Secondary | ICD-10-CM | POA: Diagnosis not present

## 2013-12-13 DIAGNOSIS — F329 Major depressive disorder, single episode, unspecified: Secondary | ICD-10-CM

## 2013-12-13 MED ORDER — CITALOPRAM HYDROBROMIDE 20 MG PO TABS: ORAL_TABLET | ORAL | Status: DC

## 2013-12-13 MED ORDER — TEMAZEPAM 15 MG PO CAPS: 15.0000 mg | ORAL_CAPSULE | Freq: Every evening | ORAL | Status: DC | PRN

## 2013-12-13 MED ORDER — MELOXICAM 15 MG PO TABS: 15.0000 mg | ORAL_TABLET | ORAL | Status: DC | PRN

## 2013-12-13 MED ORDER — GABAPENTIN 300 MG PO CAPS: ORAL_CAPSULE | ORAL | Status: DC

## 2013-12-13 MED ORDER — SIMVASTATIN 20 MG PO TABS: ORAL_TABLET | ORAL | Status: DC

## 2013-12-13 NOTE — Progress Notes (Signed)
Pre visit review using our clinic review tool, if applicable. No additional management support is needed unless otherwise documented below in the visit note. 

## 2013-12-13 NOTE — Progress Notes (Signed)
Subjective:    Patient ID: [name], female    DOB: 06/07/1946, 68 y.o.   MRN: [id number]  HPI Here to transfer from Dr Roizy Hedges  She is from [place] and this is closer for her   [place] to him since 1989   A lot of issues with her hips lately - does not have a diagnosis  Pain at night-they aches  Has pain in several places incl outer hip and groin (esp when walking) Trying accupuncture ( thinks it is starting to help) -for this and other problems  Has fibromyalgia - used to go for water aerobics and she needs to start back with it  She is on meloxicam - due to pain (aware of poss side eff and risks)  bp is stable today  No cp or palpitations or headaches or edema  No side effects to medicines  BP Readings from Last 3 Encounters:  12/13/13 132/72  07/22/13 140/90  05/30/13 130/62     Hx of hyperlipidemia  Simvastatin 3 times per week (this is all she can tolerate) Lab Results  Component Value Date   CHOL 197 04/09/2013   HDL 64.90 04/09/2013   LDLCALC 108* 04/09/2013   TRIG 119.0 04/09/2013   CHOLHDL 3 04/09/2013     GERD - has to be careful with her nsaid , on prilosec for that   Mood disorder in the past - depression - on celexa (started during a stressful situation)  She feels bad when she tries to go off of it  A "lot of stres in her life"  The celexa does help - she only takes 10 mg of it daily  Also has long hx of insomnia - restoril-- has to take it every night   Takes gabapentin for neuropathy  Twice daily - this helps a lot   Has norco-takes it very rarely for severe pain only  Takes 1/2 pill occasionally  Patient Active Problem List   Diagnosis Date Noted  . Insomnia 10/02/2012  . Need for prophylactic vaccination against Streptococcus pneumoniae (pneumococcus) 03/22/2012  . Fatigue   . Diastolic dysfunction   . Ejection fraction   . Routine health maintenance 03/26/2011  . Carotid artery disease   . Chest pain   . Drug therapy   . PEDAL EDEMA  03/17/2010  . INSOMNIA, CHRONIC 04/17/2009  . OSTEOARTHROSIS UNSPEC WHETHER GEN/LOC ANK&FOOT 01/05/2009  . DEPRESSION 05/22/2008  . TINNITUS 08/30/2007  . HYPERLIPIDEMIA 04/05/2007  . PERIPHERAL NEUROPATHY 04/05/2007  . HYPERTENSION 04/05/2007  . GASTROESOPHAGEAL REFLUX DISEASE 04/05/2007  . DEGENERATIVE JOINT DISEASE, CERVICAL SPINE 04/05/2007  . [place] DISEASE, LUMBAR 04/05/2007  . FIBROMYALGIA 04/05/2007  . MIGRAINES, HX OF 04/05/2007  . APPENDECTOMY, HX OF 04/05/2007   Past Medical History  Diagnosis Date  . Peripheral neuropathy   . Fibromyalgia   . Hyperlipemia   . Degenerative joint disease     cervical spine  . GERD (gastroesophageal reflux disease)   . Lumbar disc disease   . Hypertension   . Depression   . Carotid artery disease     Doppler, April, 2012,  4-85% R. ICA, 46-27% LICA... no significant change... frequency of Doppler moved to yearly because of stability  . Chest pain     Nuclear stress test 2006 normal  . Hx of colonoscopy   . Drug therapy     msucle aches with simvastatin, but tolerates 5mg  Creastor  . Migraines   . Small fiber neuropathy   . Obesity   .  Ejection fraction     EF 60%, echo, December 20, 2011  . Fatigue     June, 2013  . Diastolic dysfunction     Echo report, June, 2013  . Diverticulosis of colon (without mention of hemorrhage) 10/03/2007    Colonoscopy  . Internal hemorrhoids without mention of complication 4/48/1856    Colonoscopy  . Reflux esophagitis 04/21/00    EGD  . IBS (irritable bowel syndrome)   . Sleep apnea   . Pancreatitis    Past Surgical History  Procedure Laterality Date  . Laparoscopic ovarian cystectomy  1960's  . Appendectomy  1961  . Shoulder surgery  2004    "frozen"; right  . Trigger finger release  1996    right thumb   History  Substance Use Topics  . Smoking status: Former Smoker -- 1.00 packs/day for 15 years    Types: Cigarettes    Quit date: 07/11/1996  . Smokeless tobacco: Never Used  .  Alcohol Use: No   Family History  Problem Relation Age of Onset  . Coronary artery disease Father   . Heart attack Father   . Heart disease Father     CAD/MI x 3, last fatal  . Diabetes Neg Hx   . Colon cancer Neg Hx   . Breast cancer Paternal Aunt     x 2  . Stroke Mother   . Dementia Mother   . Heart disease Mother     chf  . Obesity Brother   . Hypertension Brother    Allergies  Allergen Reactions  . Codeine Nausea And Vomiting  . Nitrofuran Derivatives Nausea And Vomiting  . Celebrex [Celecoxib] Rash   Current Outpatient Prescriptions on File Prior to Visit  Medication Sig Dispense Refill  . aspirin 81 MG tablet Take 81 mg by mouth daily.       Marland Kitchen diltiazem (CARDIZEM CD) 180 MG 24 hr capsule Take 1 capsule (180 mg total) by mouth daily.  90 capsule  3  . fluocinonide gel (LIDEX) 3.14 % Apply 1 application topically 2 (two) times daily as needed. for oral ulcers       . HYDROcodone-acetaminophen (NORCO) 10-325 MG per tablet Take 1 tablet by mouth every 6 (six) hours as needed. For pain  60 tablet  2  . hydrocortisone (ANUSOL-HC) 2.5 % rectal cream Place rectally as needed for hemorrhoids.  30 g  5  . hydrocortisone (ANUSOL-HC) 25 MG suppository Place 1 suppository (25 mg total) rectally 2 (two) times daily.  12 suppository  2  . methocarbamol (ROBAXIN) 500 MG tablet Take 1 tablet (500 mg total) by mouth 4 (four) times daily.  120 tablet  2  . Multiple Vitamin (MULTIVITAMIN WITH MINERALS) TABS Take 1 tablet by mouth daily.      Marland Kitchen olmesartan-hydrochlorothiazide (BENICAR HCT) 40-25 MG per tablet Take 1 tablet by mouth daily.  90 tablet  3  . omeprazole (PRILOSEC) 40 MG capsule Take 1 capsule by mouth   daily  90 capsule  3  . Sennosides-Docusate Sodium (STOOL SOFTENER LAXATIVE PO) Take by mouth as needed.      . [DISCONTINUED] diltiazem (DILACOR XR) 180 MG 24 hr capsule Take 1 capsule (180 mg total) by mouth daily.  30 capsule  1   No current facility-administered medications  on file prior to visit.    Review of Systems Review of Systems  Constitutional: Negative for fever, appetite change, fatigue and unexpected weight change.  Eyes: Negative for pain and visual  disturbance.  Respiratory: Negative for cough and shortness of breath.   Cardiovascular: Negative for cp or palpitations    Gastrointestinal: Negative for nausea, diarrhea and constipation.  Genitourinary: Negative for urgency and frequency.  Skin: Negative for pallor or rash   MSK pos for joint and myofasical pain  Neurological: Negative for weakness, light-headedness, numbness and headaches.  Hematological: Negative for adenopathy. Does not bruise/bleed easily.  Psychiatric/Behavioral: Negative for dysphoric mood. The patient is not nervous/anxious.         Objective:   Physical Exam  Constitutional: She appears well-developed and well-nourished. No distress.  obese and well appearing   HENT:  Head: Normocephalic and atraumatic.  Right Ear: External ear normal.  Left Ear: External ear normal.  Mouth/Throat: Oropharynx is clear and moist.  Eyes: Conjunctivae and EOM are normal. Pupils are equal, round, and reactive to light. No scleral icterus.  Neck: Normal range of motion. Neck supple. No JVD present. Carotid bruit is not present. No thyromegaly present.  Cardiovascular: Normal rate, regular rhythm, normal heart sounds and intact distal pulses.  Exam reveals no gallop.   Pulmonary/Chest: Effort normal and breath sounds normal. No respiratory distress. She has no wheezes. She exhibits no tenderness.  Abdominal: Soft. Bowel sounds are normal. She exhibits no distension, no abdominal bruit and no mass. There is no tenderness.  Genitourinary: No breast swelling, tenderness, discharge or bleeding.  Musculoskeletal: Normal range of motion. She exhibits tenderness. She exhibits no edema.  Myofacial trigger points present  Hip pain on ext rotation bilat Mild trochanteric tenderness bilat  No LS  bony tenderness Neg slr  Lymphadenopathy:    She has no cervical adenopathy.  Neurological: She is alert. She has normal reflexes. No cranial nerve deficit. She exhibits normal muscle tone. Coordination normal.  Skin: Skin is warm and dry. No rash noted. No erythema. No pallor.  Psychiatric: She has a normal mood and affect.          Assessment & Plan:   Problem List Items Addressed This Visit     Cardiovascular and Mediastinum   HYPERTENSION - Primary      bp in fair control at this time  BP Readings from Last 1 Encounters:  12/13/13 132/72   No changes needed Disc lifstyle change with low sodium diet and exercise       Relevant Medications      simvastatin (ZOCOR) tablet     Digestive   GASTROESOPHAGEAL REFLUX DISEASE     Doing well on PPI This also helps with nsaid protection        Nervous and Auditory   INSOMNIA, CHRONIC     On temazepam nightly - chronic Disc sleep hygeine  Pt claims other meds and lifestyle changes have not helped  Disc risks of falls and habit     PERIPHERAL NEUROPATHY     Gabapentin is helpful -will continue this     Relevant Medications      citalopram (CELEXA) tablet      gabapentin (NEURONTIN) capsule      temazepam (RESTORIL) capsule     Musculoskeletal and Integument   FIBROMYALGIA     Per pt at baseline Disc need for low impact exercise -like swimming and water classes  Some trigger points today      Other   HYPERLIPIDEMIA     Takes simvastatin 3 d per week since that is as much as she can tolerate Disc goals for lipids and reasons to control them Rev labs  with pt Rev low sat fat diet in detail     Relevant Medications      simvastatin (ZOCOR) tablet   DEPRESSION     celexa - with bad outcome when she tries to stop it Low dose-tol well Declines need for counseling     Relevant Medications      citalopram (CELEXA) tablet

## 2013-12-13 NOTE — Patient Instructions (Signed)
Don't forget to schedule your annual mammogram  Follow up for annual exam after 9/30 with labs prior

## 2013-12-15 NOTE — Assessment & Plan Note (Signed)
Doing well on PPI This also helps with nsaid protection

## 2013-12-15 NOTE — Assessment & Plan Note (Signed)
Gabapentin is helpful -will continue this

## 2013-12-15 NOTE — Assessment & Plan Note (Signed)
bp in fair control at this time  BP Readings from Last 1 Encounters:  12/13/13 132/72   No changes needed Disc lifstyle change with low sodium diet and exercise

## 2013-12-15 NOTE — Assessment & Plan Note (Signed)
On temazepam nightly - chronic Disc sleep hygeine  Pt claims other meds and lifestyle changes have not helped  Disc risks of falls and habit

## 2013-12-15 NOTE — Assessment & Plan Note (Signed)
Per pt at baseline Disc need for low impact exercise -like swimming and water classes  Some trigger points today

## 2013-12-15 NOTE — Assessment & Plan Note (Signed)
celexa - with bad outcome when she tries to stop it Low dose-tol well Declines need for counseling

## 2013-12-15 NOTE — Assessment & Plan Note (Signed)
Takes simvastatin 3 d per week since that is as much as she can tolerate Disc goals for lipids and reasons to control them Rev labs with pt Rev low sat fat diet in detail

## 2013-12-16 ENCOUNTER — Telehealth: Payer: Self-pay | Admitting: Internal Medicine

## 2013-12-16 NOTE — Telephone Encounter (Signed)
Relevant patient education assigned to patient using Emmi. ° °

## 2014-01-20 DIAGNOSIS — Z1231 Encounter for screening mammogram for malignant neoplasm of breast: Secondary | ICD-10-CM | POA: Diagnosis not present

## 2014-01-21 ENCOUNTER — Encounter: Payer: Self-pay | Admitting: Family Medicine

## 2014-01-27 DIAGNOSIS — M999 Biomechanical lesion, unspecified: Secondary | ICD-10-CM | POA: Diagnosis not present

## 2014-01-27 DIAGNOSIS — M5430 Sciatica, unspecified side: Secondary | ICD-10-CM | POA: Diagnosis not present

## 2014-02-26 DIAGNOSIS — M5430 Sciatica, unspecified side: Secondary | ICD-10-CM | POA: Diagnosis not present

## 2014-02-26 DIAGNOSIS — M999 Biomechanical lesion, unspecified: Secondary | ICD-10-CM | POA: Diagnosis not present

## 2014-02-28 ENCOUNTER — Encounter: Payer: Self-pay | Admitting: Family Medicine

## 2014-02-28 ENCOUNTER — Ambulatory Visit (INDEPENDENT_AMBULATORY_CARE_PROVIDER_SITE_OTHER): Payer: Medicare Other | Admitting: Family Medicine

## 2014-02-28 VITALS — BP 144/86 | HR 75 | Temp 98.6°F | Ht 61.25 in | Wt 178.2 lb

## 2014-02-28 DIAGNOSIS — IMO0001 Reserved for inherently not codable concepts without codable children: Secondary | ICD-10-CM

## 2014-02-28 DIAGNOSIS — I6529 Occlusion and stenosis of unspecified carotid artery: Secondary | ICD-10-CM | POA: Diagnosis not present

## 2014-02-28 MED ORDER — OMEPRAZOLE 40 MG PO CPDR: DELAYED_RELEASE_CAPSULE | ORAL | Status: DC

## 2014-02-28 MED ORDER — DICLOFENAC POTASSIUM 50 MG PO TABS: 50.0000 mg | ORAL_TABLET | Freq: Three times a day (TID) | ORAL | Status: DC

## 2014-02-28 NOTE — Progress Notes (Signed)
Pre visit review using our clinic review tool, if applicable. No additional management support is needed unless otherwise documented below in the visit note. 

## 2014-02-28 NOTE — Progress Notes (Signed)
Subjective:    Patient ID: [name], female    DOB: July 21, 1945, 68 y.o.   MRN: [id number]  HPI Here for fibrmyalgia   Also Oa and neuropathy and deg spine dz /spinal stensis   mobic - causes cp - aching / but not like heartburn (has taken for 20 years-about 2 years ag started giving her cp) Saw Dr Ron Parker fr cp and told her heart is fine  celebrex - rash   Robaxin- takes it every am - no side effects (can take more often if needed) norco- she uses it only as a last resort  Takes gabapentin 300 mg bid now (would be open to more if she could tolerate) Also on celexa - 10 mg at a time - small dose - des ok  Restoril at night   [facility]   Patient Active Problem List   Diagnosis Date Noted  . Fatigue   . Diastolic dysfunction   . Ejection fraction   . Routine health maintenance 03/26/2011  . Carotid artery disease   . Chest pain   . Drug therapy   . PEDAL EDEMA 03/17/2010  . INSOMNIA, CHRONIC 04/17/2009  . OSTEOARTHROSIS UNSPEC WHETHER GEN/LOC ANK&FOOT 01/05/2009  . DEPRESSION 05/22/2008  . TINNITUS 08/30/2007  . HYPERLIPIDEMIA 04/05/2007  . PERIPHERAL NEUROPATHY 04/05/2007  . HYPERTENSION 04/05/2007  . GASTROESOPHAGEAL REFLUX DISEASE 04/05/2007  . DEGENERATIVE JOINT DISEASE, CERVICAL SPINE 04/05/2007  . [place] DISEASE, LUMBAR 04/05/2007  . FIBROMYALGIA 04/05/2007  . MIGRAINES, HX OF 04/05/2007  . APPENDECTOMY, HX OF 04/05/2007   Past Medical History  Diagnosis Date  . Peripheral neuropathy   . Fibromyalgia   . Hyperlipemia   . Degenerative joint disease     cervical spine  . GERD (gastroesophageal reflux disease)   . Lumbar disc disease   . Hypertension   . Depression   . Carotid artery disease     Doppler, April, 2012,  4-33% R. ICA, 29-51% LICA... no significant change... frequency of Doppler moved to yearly because of stability  . Chest pain     Nuclear stress test 2006 normal  . Hx of colonoscopy   . Drug therapy    msucle aches with simvastatin, but tolerates 5mg  Creastor  . Migraines   . Small fiber neuropathy   . Obesity   . Ejection fraction     EF 60%, echo, December 20, 2011  . Fatigue     June, 2013  . Diastolic dysfunction     Echo report, June, 2013  . Diverticulosis of colon (without mention of hemorrhage) 10/03/2007    Colonoscopy  . Internal hemorrhoids without mention of complication 8/84/1660    Colonoscopy  . Reflux esophagitis 04/21/00    EGD  . IBS (irritable bowel syndrome)   . Sleep apnea   . Pancreatitis    Past Surgical History  Procedure Laterality Date  . Laparoscopic ovarian cystectomy  1960's  . Appendectomy  1961  . Shoulder surgery  2004    "frozen"; right  . Trigger finger release  1996    right thumb   History  Substance Use Topics  . Smoking status: Former Smoker -- 1.00 packs/day for 15 years    Types: Cigarettes    Quit date: 07/11/1996  . Smokeless tobacco: Never Used  . Alcohol Use: No   Family History  Problem Relation Age of Onset  . Coronary artery disease Father   . Heart attack Father   .  Heart disease Father     CAD/MI x 3, last fatal  . Diabetes Neg Hx   . Colon cancer Neg Hx   . Breast cancer Paternal Aunt     x 2  . Stroke Mother   . Dementia Mother   . Heart disease Mother     chf  . Obesity Brother   . Hypertension Brother    Allergies  Allergen Reactions  . Codeine Nausea And Vomiting  . Mobic [Meloxicam]     Chest pain   . Nitrofuran Derivatives Nausea And Vomiting  . Celebrex [Celecoxib] Rash   Current Outpatient Prescriptions on File Prior to Visit  Medication Sig Dispense Refill  . aspirin 81 MG tablet Take 81 mg by mouth daily.       . citalopram (CELEXA) 20 MG tablet Take 1/2  tablet by mouth  daily  45 tablet  3  . Coenzyme Q10 (COQ10 PO) Take by mouth.      . diltiazem (CARDIZEM CD) 180 MG 24 hr capsule Take 1 capsule (180 mg total) by mouth daily.  90 capsule  3  . fluocinonide gel (LIDEX) 0.35 % Apply 1  application topically 2 (two) times daily as needed. for oral ulcers       . gabapentin (NEURONTIN) 300 MG capsule Take 1 capsule by mouth  twice daily  180 capsule  3  . HYDROcodone-acetaminophen (NORCO) 10-325 MG per tablet Take 1 tablet by mouth every 6 (six) hours as needed. For pain  60 tablet  2  . hydrocortisone (ANUSOL-HC) 2.5 % rectal cream Place rectally as needed for hemorrhoids.  30 g  5  . hydrocortisone (ANUSOL-HC) 25 MG suppository Place 1 suppository (25 mg total) rectally 2 (two) times daily.  12 suppository  2  . methocarbamol (ROBAXIN) 500 MG tablet Take 1 tablet (500 mg total) by mouth 4 (four) times daily.  120 tablet  2  . Multiple Vitamin (MULTIVITAMIN WITH MINERALS) TABS Take 1 tablet by mouth daily.      Marland Kitchen olmesartan-hydrochlorothiazide (BENICAR HCT) 40-25 MG per tablet Take 1 tablet by mouth daily.  90 tablet  3  . Sennosides-Docusate Sodium (STOOL SOFTENER LAXATIVE PO) Take by mouth as needed.      . simvastatin (ZOCOR) 20 MG tablet Take one tablet by mouth in the evenings three times weekly (Mon, Wed, Fri).  36 tablet  3  . temazepam (RESTORIL) 15 MG capsule Take 1 capsule (15 mg total) by mouth at bedtime as needed.  30 capsule  3  . [DISCONTINUED] diltiazem (DILACOR XR) 180 MG 24 hr capsule Take 1 capsule (180 mg total) by mouth daily.  30 capsule  1   No current facility-administered medications on file prior to visit.    Review of Systems Review of Systems  Constitutional: Negative for fever, appetite change, fatigue and unexpected weight change.  Eyes: Negative for pain and visual disturbance.  Respiratory: Negative for cough and shortness of breath.   Cardiovascular: Negative for cp or palpitations    Gastrointestinal: Negative for nausea, diarrhea and constipation.  Genitourinary: Negative for urgency and frequency.  Skin: Negative for pallor or rash   MSK pos for joint and muscle pain  Neurological: Negative for weakness, light-headedness, numbness and  headaches.  Hematological: Negative for adenopathy. Does not bruise/bleed easily.  Psychiatric/Behavioral: Negative for dysphoric mood. The patient is not nervous/anxious.         Objective:   Physical Exam  Constitutional: She appears well-developed and well-nourished. No  distress.  obese and well appearing   HENT:  Head: Normocephalic and atraumatic.  Mouth/Throat: Oropharynx is clear and moist.  Eyes: Conjunctivae and EOM are normal. Pupils are equal, round, and reactive to light. No scleral icterus.  Neck: Normal range of motion. Neck supple. No JVD present.  Cardiovascular: Normal rate and regular rhythm.   Pulmonary/Chest: Effort normal and breath sounds normal.  Musculoskeletal: She exhibits tenderness. She exhibits no edema.  Tender trigger points on back   Tenderness/ soft tissue of arms and legs Chest wall tenderness diffusely Poor rom of spine   Lymphadenopathy:    She has no cervical adenopathy.  Neurological: She is alert. She has normal reflexes. No cranial nerve deficit. She exhibits normal muscle tone. Coordination normal.  Skin: Skin is warm and dry. No rash noted. No erythema. No pallor.  Psychiatric: She has a normal mood and affect.          Assessment & Plan:   Problem List Items Addressed This Visit     Musculoskeletal and Integument   FIBROMYALGIA - Primary     Reviewed list of meds she takes daily and prn  Have the ability to titrate doses of each  Wants to try a diff nsaid- since is also helps OA and spinal pain Will try diclofenac - rev poss side eff in detail incl GI or cardiac side eff or edema  Will update   Also disc poss of cymbalta in the future  Enc to continue her water exercise

## 2014-02-28 NOTE — Patient Instructions (Signed)
Stop mobic and try diclfenac instead  1 pill up to three times daily with food  If not helpful or side effects let me know Keep doing water aerobics

## 2014-03-02 NOTE — Assessment & Plan Note (Signed)
Reviewed list of meds she takes daily and prn  Have the ability to titrate doses of each  Wants to try a diff nsaid- since is also helps OA and spinal pain Will try diclofenac - rev poss side eff in detail incl GI or cardiac side eff or edema  Will update   Also disc poss of cymbalta in the future  Enc to continue her water exercise

## 2014-03-03 ENCOUNTER — Encounter: Payer: Self-pay | Admitting: Family Medicine

## 2014-04-03 ENCOUNTER — Encounter: Payer: Self-pay | Admitting: Internal Medicine

## 2014-04-03 ENCOUNTER — Ambulatory Visit (INDEPENDENT_AMBULATORY_CARE_PROVIDER_SITE_OTHER): Payer: Medicare Other | Admitting: Internal Medicine

## 2014-04-03 VITALS — BP 132/70 | HR 87 | Temp 99.3°F | Wt 178.0 lb

## 2014-04-03 DIAGNOSIS — R059 Cough, unspecified: Secondary | ICD-10-CM | POA: Diagnosis not present

## 2014-04-03 DIAGNOSIS — I6529 Occlusion and stenosis of unspecified carotid artery: Secondary | ICD-10-CM

## 2014-04-03 DIAGNOSIS — J3489 Other specified disorders of nose and nasal sinuses: Secondary | ICD-10-CM

## 2014-04-03 DIAGNOSIS — J069 Acute upper respiratory infection, unspecified: Secondary | ICD-10-CM

## 2014-04-03 DIAGNOSIS — R05 Cough: Secondary | ICD-10-CM

## 2014-04-03 MED ORDER — BENZONATATE 200 MG PO CAPS: 200.0000 mg | ORAL_CAPSULE | Freq: Three times a day (TID) | ORAL | Status: DC | PRN

## 2014-04-03 NOTE — Progress Notes (Signed)
HPI  Pt presents to the clinic today with c/o cough, sore throat and fever. She reports this started 3 days ago. The cough is unproductive. She denies difficulty swallowing. She has tried OTC cough medicine and sudafed with some relief. She reports that she has had sick contacts. She has no history of allergies or breathing problems.  Review of Systems      Past Medical History  Diagnosis Date  . Peripheral neuropathy   . Fibromyalgia   . Hyperlipemia   . Degenerative joint disease     cervical spine  . GERD (gastroesophageal reflux disease)   . Lumbar disc disease   . Hypertension   . Depression   . Carotid artery disease     Doppler, April, 2012,  2-70% R. ICA, 62-37% LICA... no significant change... frequency of Doppler moved to yearly because of stability  . Chest pain     Nuclear stress test 2006 normal  . Hx of colonoscopy   . Drug therapy     msucle aches with simvastatin, but tolerates 5mg  Creastor  . Migraines   . Small fiber neuropathy   . Obesity   . Ejection fraction     EF 60%, echo, December 20, 2011  . Fatigue     June, 2013  . Diastolic dysfunction     Echo report, June, 2013  . Diverticulosis of colon (without mention of hemorrhage) 10/03/2007    Colonoscopy  . Internal hemorrhoids without mention of complication 01/05/3150    Colonoscopy  . Reflux esophagitis 04/21/00    EGD  . IBS (irritable bowel syndrome)   . Sleep apnea   . Pancreatitis     Family History  Problem Relation Age of Onset  . Coronary artery disease Father   . Heart attack Father   . Heart disease Father     CAD/MI x 3, last fatal  . Diabetes Neg Hx   . Colon cancer Neg Hx   . Breast cancer Paternal Aunt     x 2  . Stroke Mother   . Dementia Mother   . Heart disease Mother     chf  . Obesity Brother   . Hypertension Brother     History   Social History  . Marital Status: Married    Spouse Name: N/A    Number of Children: 0  . Years of Education: 13   Occupational  History  . retired   .     Social History Main Topics  . Smoking status: Former Smoker -- 1.00 packs/day for 15 years    Types: Cigarettes    Quit date: 07/11/1996  . Smokeless tobacco: Never Used  . Alcohol Use: No  . Drug Use: No  . Sexual Activity: No   Other Topics Concern  . Not on file   Social History Narrative   HSG, [employer] school, AA degree [employer]. Married '64- 21 years/divorced; married '98. No children. 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    Allergies  Allergen Reactions  . Codeine Nausea And Vomiting  . Mobic [Meloxicam]     Chest pain   . Nitrofuran Derivatives Nausea And Vomiting  . Celebrex [Celecoxib] Rash     Constitutional: Positive fever. Denies headache, fatigue, or abrupt weight changes.  HEENT:  Positive sore throat. Denies eye redness, eye pain, pressure behind the eyes, facial pain, nasal congestion, ear pain, ringing in the ears, wax buildup, runny nose or bloody  nose. Respiratory: Positive cough. Denies difficulty breathing or shortness of breath.  Cardiovascular: Denies chest pain, chest tightness, palpitations or swelling in the hands or feet.   No other specific complaints in a complete review of systems (except as listed in HPI above).  Objective:   BP 132/70  Pulse 87  Temp(Src) 99.3 F (37.4 C) (Oral)  Wt 178 lb (80.74 kg)  SpO2 99% Wt Readings from Last 3 Encounters:  04/03/14 178 lb (80.74 kg)  02/28/14 178 lb 4 oz (80.854 kg)  12/13/13 177 lb 12 oz (80.627 kg)     General: Appears her stated age, well developed, well nourished in NAD. HEENT: Ears: Tm's gray and intact, normal light reflex; Nose: mucosa pink and moist, septum midline; Throat/Mouth: + PND. Teeth present, mucosa erythematous and moist, no exudate noted, no lesions or ulcerations noted.  Cardiovascular: Normal rate and rhythm. S1,S2 noted.  No murmur, rubs or gallops noted.  Pulmonary/Chest: Normal  effort and positive vesicular breath sounds. No respiratory distress. No wheezes, rales or ronchi noted.      Assessment & Plan:    Viral Upper Respiratory Infection:  Get some rest and drink plenty of water Do salt water gargles for the sore throat Continue sudafed Add flonase Ibuprofen or tylenol for fever RX for tessalon pearls  RTC as needed or if symptoms persist.

## 2014-04-03 NOTE — Progress Notes (Signed)
Subjective:    Patient ID: [name], female    DOB: [birth year], 68 y.o.   MRN: [id number]  HPI HPI  Pt presents to the clinic today with c/o sore throat, non-productive cough and fever (99) x 3 days.  She has taken dimetap with some relief and Sudafed this morning - has been exposed to sick contacts.  Review of Systems      Past Medical History  Diagnosis Date  . Peripheral neuropathy   . Fibromyalgia   . Hyperlipemia   . Degenerative joint disease     cervical spine  . GERD (gastroesophageal reflux disease)   . Lumbar disc disease   . Hypertension   . Depression   . Carotid artery disease     Doppler, April, 2012,  5-36% R. ICA, 46-80% LICA... no significant change... frequency of Doppler moved to yearly because of stability  . Chest pain     Nuclear stress test 2006 normal  . Hx of colonoscopy   . Drug therapy     msucle aches with simvastatin, but tolerates 5mg  Creastor  . Migraines   . Small fiber neuropathy   . Obesity   . Ejection fraction     EF 60%, echo, December 20, 2011  . Fatigue     June, 2013  . Diastolic dysfunction     Echo report, June, 2013  . Diverticulosis of colon (without mention of hemorrhage) 10/03/2007    Colonoscopy  . Internal hemorrhoids without mention of complication 09/29/2246    Colonoscopy  . Reflux esophagitis 04/21/00    EGD  . IBS (irritable bowel syndrome)   . Sleep apnea   . Pancreatitis     Family History  Problem Relation Age of Onset  . Coronary artery disease Father   . Heart attack Father   . Heart disease Father     CAD/MI x 3, last fatal  . Diabetes Neg Hx   . Colon cancer Neg Hx   . Breast cancer Paternal Aunt     x 2  . Stroke Mother   . Dementia Mother   . Heart disease Mother     chf  . Obesity Brother   . Hypertension Brother     History   Social History  . Marital Status: Married    Spouse Name: N/A    Number of Children: 0  . Years of Education: 13   Occupational History  . retired     .     Social History Main Topics  . Smoking status: Former Smoker -- 1.00 packs/day for 15 years    Types: Cigarettes    Quit date: 07/11/1996  . Smokeless tobacco: Never Used  . Alcohol Use: No  . Drug Use: No  . Sexual Activity: No   Other Topics Concern  . Not on file   Social History Narrative   HSG, [employer] school, AA degree [employer]. Married '64- 21 years/divorced; married '98. No children. 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    Allergies  Allergen Reactions  . Codeine Nausea And Vomiting  . Mobic [Meloxicam]     Chest pain   . Nitrofuran Derivatives Nausea And Vomiting  . Celebrex [Celecoxib] Rash     Constitutional: Positive headache, fatigue and fever. Denies abrupt weight changes.  HEENT:  Positive sore throat. Denies eye redness, eye pain, pressure behind the eyes, facial pain, ear pain, ringing in the ears, wax buildup,  or bloody nose. Respiratory: Positive cough. Denies difficulty breathing or shortness of breath.  Cardiovascular: Denies chest pain, chest tightness, palpitations or swelling in the hands or feet.   No other specific complaints in a complete review of systems (except as listed in HPI above).  Objective:   Wt 178 lb (80.74 kg) Wt Readings from Last 3 Encounters:  04/03/14 178 lb (80.74 kg)  02/28/14 178 lb 4 oz (80.854 kg)  12/13/13 177 lb 12 oz (80.627 kg)     General: Appears his stated age, well developed, well nourished in NAD. HEENT:Ears: Tm's gray and intact, normal light reflex; Nose: mucosa pink and moist, septum midline; Throat/Mouth: Slightly erythematous Neck: . Neck supple,No lymphadenopathy noted  Cardiovascular: Normal rate and rhythm. S1,S2 noted.  No murmur, rubs or gallops noted.Pulmonary/Chest: Normal effort and positive vesicular breath sounds. No respiratory distress. No wheezes, rales or ronchi noted.      Assessment & Plan:   Cough  Acute upper  respiratory infections of unspecified site  Rhinorrhea  Take Zyrtec and Flonase Get some rest and drink plenty of water Do salt water gargles for the sore throat   RTC as needed or if symptoms persist.      Review of Systems     Objective:   Physical Exam        Assessment & Plan:

## 2014-04-03 NOTE — Progress Notes (Signed)
Pre visit review using our clinic review tool, if applicable. No additional management support is needed unless otherwise documented below in the visit note. 

## 2014-04-03 NOTE — Patient Instructions (Signed)
Upper Respiratory Infection, Adult An upper respiratory infection (URI) is also sometimes known as the common cold. The upper respiratory tract includes the nose, sinuses, throat, trachea, and bronchi. Bronchi are the airways leading to the lungs. Most people improve within 1 week, but symptoms can last up to 2 weeks. A residual cough may last even longer.  CAUSES Many different viruses can infect the tissues lining the upper respiratory tract. The tissues become irritated and inflamed and often become very moist. Mucus production is also common. A cold is contagious. You can easily spread the virus to others by oral contact. This includes kissing, sharing a glass, coughing, or sneezing. Touching your mouth or nose and then touching a surface, which is then touched by another person, can also spread the virus. SYMPTOMS  Symptoms typically develop 1 to 3 days after you come in contact with a cold virus. Symptoms vary from person to person. They may include:  Runny nose.  Sneezing.  Nasal congestion.  Sinus irritation.  Sore throat.  Loss of voice (laryngitis).  Cough.  Fatigue.  Muscle aches.  Loss of appetite.  Headache.  Low-grade fever. DIAGNOSIS  You might diagnose your own cold based on familiar symptoms, since most people get a cold 2 to 3 times a year. Your caregiver can confirm this based on your exam. Most importantly, your caregiver can check that your symptoms are not due to another disease such as strep throat, sinusitis, pneumonia, asthma, or epiglottitis. Blood tests, throat tests, and X-rays are not necessary to diagnose a common cold, but they may sometimes be helpful in excluding other more serious diseases. Your caregiver will decide if any further tests are required. RISKS AND COMPLICATIONS  You may be at risk for a more severe case of the common cold if you smoke cigarettes, have chronic heart disease (such as heart failure) or lung disease (such as asthma), or if  you have a weakened immune system. The very young and very old are also at risk for more serious infections. Bacterial sinusitis, middle ear infections, and bacterial pneumonia can complicate the common cold. The common cold can worsen asthma and chronic obstructive pulmonary disease (COPD). Sometimes, these complications can require emergency medical care and may be life-threatening. PREVENTION  The best way to protect against getting a cold is to practice good hygiene. Avoid oral or hand contact with people with cold symptoms. Wash your hands often if contact occurs. There is no clear evidence that vitamin C, vitamin E, echinacea, or exercise reduces the chance of developing a cold. However, it is always recommended to get plenty of rest and practice good nutrition. TREATMENT  Treatment is directed at relieving symptoms. There is no cure. Antibiotics are not effective, because the infection is caused by a virus, not by bacteria. Treatment may include:  Increased fluid intake. Sports drinks offer valuable electrolytes, sugars, and fluids.  Breathing heated mist or steam (vaporizer or shower).  Eating chicken soup or other clear broths, and maintaining good nutrition.  Getting plenty of rest.  Using gargles or lozenges for comfort.  Controlling fevers with ibuprofen or acetaminophen as directed by your caregiver.  Increasing usage of your inhaler if you have asthma. Zinc gel and zinc lozenges, taken in the first 24 hours of the common cold, can shorten the duration and lessen the severity of symptoms. Pain medicines may help with fever, muscle aches, and throat pain. A variety of non-prescription medicines are available to treat congestion and runny nose. Your caregiver   can make recommendations and may suggest nasal or lung inhalers for other symptoms.  HOME CARE INSTRUCTIONS   Only take over-the-counter or prescription medicines for pain, discomfort, or fever as directed by your  caregiver.  Use a warm mist humidifier or inhale steam from a shower to increase air moisture. This may keep secretions moist and make it easier to breathe.  Drink enough water and fluids to keep your urine clear or pale yellow.  Rest as needed.  Return to work when your temperature has returned to normal or as your caregiver advises. You may need to stay home longer to avoid infecting others. You can also use a face mask and careful hand washing to prevent spread of the virus. SEEK MEDICAL CARE IF:   After the first few days, you feel you are getting worse rather than better.  You need your caregiver's advice about medicines to control symptoms.  You develop chills, worsening shortness of breath, or brown or red sputum. These may be signs of pneumonia.  You develop yellow or brown nasal discharge or pain in the face, especially when you bend forward. These may be signs of sinusitis.  You develop a fever, swollen neck glands, pain with swallowing, or white areas in the back of your throat. These may be signs of strep throat. SEEK IMMEDIATE MEDICAL CARE IF:   You have a fever.  You develop severe or persistent headache, ear pain, sinus pain, or chest pain.  You develop wheezing, a prolonged cough, cough up blood, or have a change in your usual mucus (if you have chronic lung disease).  You develop sore muscles or a stiff neck. Document Released: 12/21/2000 Document Revised: 09/19/2011 Document Reviewed: 10/02/2013 Exit[employer] Patient Information 2015 [employer]. This information is not intended to replace advice given to you by your health care provider. Make sure you discuss any questions you have with your health care provider.  

## 2014-05-01 DIAGNOSIS — M7061 Trochanteric bursitis, right hip: Secondary | ICD-10-CM | POA: Diagnosis not present

## 2014-05-13 ENCOUNTER — Telehealth: Payer: Self-pay | Admitting: Family Medicine

## 2014-05-13 DIAGNOSIS — I10 Essential (primary) hypertension: Secondary | ICD-10-CM

## 2014-05-13 DIAGNOSIS — E785 Hyperlipidemia, unspecified: Secondary | ICD-10-CM

## 2014-05-13 NOTE — Telephone Encounter (Signed)
-----   Message from Terri J Walsh sent at 05/07/2014  4:10 PM EDT ----- Regarding: Lab orders for Wednesday, 11.4.15 Patient is scheduled for CPX labs, please order future labs, Thanks , Terri  

## 2014-05-14 ENCOUNTER — Other Ambulatory Visit (INDEPENDENT_AMBULATORY_CARE_PROVIDER_SITE_OTHER): Payer: Medicare Other

## 2014-05-14 DIAGNOSIS — M9903 Segmental and somatic dysfunction of lumbar region: Secondary | ICD-10-CM | POA: Diagnosis not present

## 2014-05-14 DIAGNOSIS — E785 Hyperlipidemia, unspecified: Secondary | ICD-10-CM | POA: Diagnosis not present

## 2014-05-14 DIAGNOSIS — I10 Essential (primary) hypertension: Secondary | ICD-10-CM

## 2014-05-14 DIAGNOSIS — M5441 Lumbago with sciatica, right side: Secondary | ICD-10-CM | POA: Diagnosis not present

## 2014-05-14 LAB — CBC WITH DIFFERENTIAL/PLATELET
Basophils Absolute: 0 10*3/uL (ref 0.0–0.1)
Basophils Relative: 0.2 % (ref 0.0–3.0)
Eosinophils Absolute: 0.2 10*3/uL (ref 0.0–0.7)
Eosinophils Relative: 1.6 % (ref 0.0–5.0)
HCT: 39.6 % (ref 36.0–46.0)
Hemoglobin: 13 g/dL (ref 12.0–15.0)
Lymphocytes Relative: 25.1 % (ref 12.0–46.0)
Lymphs Abs: 2.6 10*3/uL (ref 0.7–4.0)
MCHC: 32.9 g/dL (ref 30.0–36.0)
MCV: 91.3 fl (ref 78.0–100.0)
Monocytes Absolute: 0.7 10*3/uL (ref 0.1–1.0)
Monocytes Relative: 7.1 % (ref 3.0–12.0)
Neutro Abs: 6.9 10*3/uL (ref 1.4–7.7)
Neutrophils Relative %: 66 % (ref 43.0–77.0)
Platelets: 308 10*3/uL (ref 150.0–400.0)
RBC: 4.34 Mil/uL (ref 3.87–5.11)
RDW: 14.4 % (ref 11.5–15.5)
WBC: 10.5 10*3/uL (ref 4.0–10.5)

## 2014-05-14 LAB — COMPREHENSIVE METABOLIC PANEL
ALT: 18 U/L (ref 0–35)
AST: 19 U/L (ref 0–37)
Albumin: 3.2 g/dL — ABNORMAL LOW (ref 3.5–5.2)
Alkaline Phosphatase: 59 U/L (ref 39–117)
BUN: 22 mg/dL (ref 6–23)
CO2: 23 mEq/L (ref 19–32)
Calcium: 9.5 mg/dL (ref 8.4–10.5)
Chloride: 102 mEq/L (ref 96–112)
Creatinine, Ser: 1 mg/dL (ref 0.4–1.2)
GFR: 55.8 mL/min — ABNORMAL LOW (ref >60.00)
Glucose, Bld: 90 mg/dL (ref 70–99)
Potassium: 4.4 mEq/L (ref 3.5–5.1)
Sodium: 136 mEq/L (ref 135–145)
Total Bilirubin: 0.4 mg/dL (ref 0.2–1.2)
Total Protein: 6.2 g/dL (ref 6.0–8.3)

## 2014-05-14 LAB — LIPID PANEL
Cholesterol: 188 mg/dL (ref 0–200)
HDL: 74.6 mg/dL (ref >39.00)
LDL Cholesterol: 100 mg/dL — ABNORMAL HIGH (ref 0–99)
NonHDL: 113.4
Total CHOL/HDL Ratio: 3
Triglycerides: 66 mg/dL (ref 0.0–149.0)
VLDL: 13.2 mg/dL (ref 0.0–40.0)

## 2014-05-15 LAB — TSH: TSH: 1.08 u[IU]/mL (ref 0.35–4.50)

## 2014-05-21 ENCOUNTER — Encounter: Payer: Medicare Other | Admitting: Family Medicine

## 2014-05-22 ENCOUNTER — Telehealth: Payer: Self-pay | Admitting: Family Medicine

## 2014-05-22 NOTE — Telephone Encounter (Signed)
Patient missed her appt for yesterday for her CPE. Pt has already done her labs on 11/4 and wanted to know if she needs to be seen before the next available CPE slot which is in June.

## 2014-05-22 NOTE — Telephone Encounter (Signed)
No thanks  

## 2014-06-20 DIAGNOSIS — M7061 Trochanteric bursitis, right hip: Secondary | ICD-10-CM | POA: Diagnosis not present

## 2014-06-20 DIAGNOSIS — M7060 Trochanteric bursitis, unspecified hip: Secondary | ICD-10-CM | POA: Diagnosis not present

## 2014-07-16 ENCOUNTER — Other Ambulatory Visit: Payer: Self-pay | Admitting: *Deleted

## 2014-07-16 NOTE — Telephone Encounter (Signed)
Schedule winter or early spring f/u please and refill until then Thanks

## 2014-07-16 NOTE — Telephone Encounter (Signed)
Fax refill request, pt no showed her CPE in Nov, and no recent/future appt., please advise

## 2014-07-18 MED ORDER — OLMESARTAN MEDOXOMIL-HCTZ 40-25 MG PO TABS: 1.0000 | ORAL_TABLET | Freq: Every day | ORAL | Status: DC

## 2014-07-18 MED ORDER — DILTIAZEM HCL ER COATED BEADS 180 MG PO CP24: 180.0000 mg | ORAL_CAPSULE | Freq: Every day | ORAL | Status: DC

## 2014-07-18 NOTE — Telephone Encounter (Signed)
appt scheduled and med refilled 

## 2014-07-22 NOTE — Telephone Encounter (Signed)
Pt left v/m she had received email from [facility] that pharmacy had not received refills on diltiazem and benicar hct. Pt will call [facility] and speak with rep; rxs filled 07/18/2014.

## 2014-08-05 DIAGNOSIS — M7061 Trochanteric bursitis, right hip: Secondary | ICD-10-CM | POA: Diagnosis not present

## 2014-08-05 DIAGNOSIS — M545 Low back pain: Secondary | ICD-10-CM | POA: Diagnosis not present

## 2014-08-09 DIAGNOSIS — M545 Low back pain: Secondary | ICD-10-CM | POA: Diagnosis not present

## 2014-08-15 DIAGNOSIS — M545 Low back pain: Secondary | ICD-10-CM | POA: Diagnosis not present

## 2014-08-15 DIAGNOSIS — M7061 Trochanteric bursitis, right hip: Secondary | ICD-10-CM | POA: Diagnosis not present

## 2014-08-27 ENCOUNTER — Other Ambulatory Visit: Payer: Self-pay | Admitting: Family Medicine

## 2014-08-27 NOTE — Telephone Encounter (Signed)
Last prescribed on 12/23/13. Last seen on 02/28/14. Next appt 09/24/14.

## 2014-08-27 NOTE — Telephone Encounter (Signed)
Px written for call in   

## 2014-08-28 NOTE — Telephone Encounter (Signed)
Called in Rx to [facility] as instructed.

## 2014-09-02 DIAGNOSIS — M7061 Trochanteric bursitis, right hip: Secondary | ICD-10-CM | POA: Diagnosis not present

## 2014-09-02 DIAGNOSIS — M5441 Lumbago with sciatica, right side: Secondary | ICD-10-CM | POA: Diagnosis not present

## 2014-09-03 DIAGNOSIS — M9903 Segmental and somatic dysfunction of lumbar region: Secondary | ICD-10-CM | POA: Diagnosis not present

## 2014-09-03 DIAGNOSIS — M5441 Lumbago with sciatica, right side: Secondary | ICD-10-CM | POA: Diagnosis not present

## 2014-09-11 ENCOUNTER — Encounter: Payer: Self-pay | Admitting: Family Medicine

## 2014-09-11 ENCOUNTER — Ambulatory Visit (INDEPENDENT_AMBULATORY_CARE_PROVIDER_SITE_OTHER): Payer: Medicare Other | Admitting: Family Medicine

## 2014-09-11 VITALS — BP 122/74 | HR 76 | Temp 98.5°F | Wt 170.8 lb

## 2014-09-11 DIAGNOSIS — R0789 Other chest pain: Secondary | ICD-10-CM

## 2014-09-11 NOTE — Assessment & Plan Note (Addendum)
Not at all likely to be intrathoracic.  Use hydrocodone as needed, with miralax. Can reinforce the area with relief.  D/w pt, f/u prn.  No need to image at this point, as it would not likely change mgmt.  She agrees.

## 2014-09-11 NOTE — Progress Notes (Signed)
8 days ago she went to chiropractor.  Day before that she had R hip injected by ortho.   She had her lower back adjusted.   During adjustment, she felt a pain in the chest wall, "like something popped" in her L chest wall.  This is the second time she's had an episode like this.   Pain with deep breath, cough, sneezing.  No bruising.  No breast pain.    No R sided sx.  No FCNAVD.  No rash.  The pain feels not radicular, not sharp.    Meds, vitals, and allergies reviewed.   ROS: See HPI.  Otherwise, noncontributory.  nad ncat Neck supple rrr ctab L chest wall ttp, lower ribs, mid clavicular line, not at the breast itself, no bruising, no rash.  Less pain with deep breath with chest wall reinforcement.

## 2014-09-11 NOTE — Patient Instructions (Signed)
You likely have chest wall pain (not internal chest pain) and this should slowly improve.   "Give yourself a hug" when you need to cough, sneeze or laugh.  Take hydrocodone as needed and start taking miralax daily.   That should help. Take care.  Glad to see you.

## 2014-09-24 ENCOUNTER — Ambulatory Visit (INDEPENDENT_AMBULATORY_CARE_PROVIDER_SITE_OTHER): Payer: Medicare Other | Admitting: Family Medicine

## 2014-09-24 ENCOUNTER — Encounter: Payer: Self-pay | Admitting: Family Medicine

## 2014-09-24 VITALS — BP 122/80 | HR 79 | Temp 98.6°F | Wt 168.4 lb

## 2014-09-24 DIAGNOSIS — M797 Fibromyalgia: Secondary | ICD-10-CM | POA: Diagnosis not present

## 2014-09-24 DIAGNOSIS — F329 Major depressive disorder, single episode, unspecified: Secondary | ICD-10-CM | POA: Diagnosis not present

## 2014-09-24 DIAGNOSIS — I10 Essential (primary) hypertension: Secondary | ICD-10-CM

## 2014-09-24 DIAGNOSIS — F32A Depression, unspecified: Secondary | ICD-10-CM

## 2014-09-24 MED ORDER — DILTIAZEM HCL ER COATED BEADS 180 MG PO CP24: 180.0000 mg | ORAL_CAPSULE | Freq: Every day | ORAL | Status: DC

## 2014-09-24 MED ORDER — CITALOPRAM HYDROBROMIDE 20 MG PO TABS: ORAL_TABLET | ORAL | Status: DC

## 2014-09-24 MED ORDER — SIMVASTATIN 20 MG PO TABS: ORAL_TABLET | ORAL | Status: DC

## 2014-09-24 MED ORDER — GABAPENTIN 300 MG PO CAPS: ORAL_CAPSULE | ORAL | Status: AC

## 2014-09-24 MED ORDER — OLMESARTAN MEDOXOMIL-HCTZ 40-25 MG PO TABS: 1.0000 | ORAL_TABLET | Freq: Every day | ORAL | Status: DC

## 2014-09-24 NOTE — Patient Instructions (Signed)
Get back to exercise -I think that will help your energy levels  Get back to citalopram  Here is px for your medications  No other changes

## 2014-09-24 NOTE — Progress Notes (Signed)
Pre visit review using our clinic review tool, if applicable. No additional management support is needed unless otherwise documented below in the visit note. 

## 2014-09-24 NOTE — Progress Notes (Signed)
Subjective:    Patient ID: [name], female    DOB: [birth year], 69 y.o.   MRN: [id number]  HPI Here for f/u of chronic conditions   She is more tired and lethargic over the past week  Is feeling down as well  Does not think she has a virus  Just tired and head is not clear   Not going for exercise ([facility]  Setting up her glider at home and a total gym   She let her citalopram run out - did send in the px   bp is stable today  No cp or palpitations or headaches or edema  No side effects to medicines  BP Readings from Last 3 Encounters:  09/24/14 122/80  09/11/14 122/74  04/03/14 132/70     Started weight watchers - lost 9 lb so far   Patient Active Problem List   Diagnosis Date Noted  . Chest wall pain 09/11/2014  . Fatigue   . Diastolic dysfunction   . Ejection fraction   . Routine health maintenance 03/26/2011  . Carotid artery disease   . Chest pain   . Drug therapy   . PEDAL EDEMA 03/17/2010  . INSOMNIA, CHRONIC 04/17/2009  . OSTEOARTHROSIS UNSPEC WHETHER GEN/LOC ANK&FOOT 01/05/2009  . DEPRESSION 05/22/2008  . TINNITUS 08/30/2007  . Hyperlipidemia 04/05/2007  . PERIPHERAL NEUROPATHY 04/05/2007  . Essential hypertension 04/05/2007  . GASTROESOPHAGEAL REFLUX DISEASE 04/05/2007  . DEGENERATIVE JOINT DISEASE, CERVICAL SPINE 04/05/2007  . [place] DISEASE, LUMBAR 04/05/2007  . FIBROMYALGIA 04/05/2007  . MIGRAINES, HX OF 04/05/2007  . APPENDECTOMY, HX OF 04/05/2007   Past Medical History  Diagnosis Date  . Peripheral neuropathy   . Fibromyalgia   . Hyperlipemia   . Degenerative joint disease     cervical spine  . GERD (gastroesophageal reflux disease)   . Lumbar disc disease   . Hypertension   . Depression   . Carotid artery disease     Doppler, April, 2012,  6-43% R. ICA, 32-95% LICA... no significant change... frequency of Doppler moved to yearly because of stability  . Chest pain    Nuclear stress test 2006 normal  . Hx of colonoscopy   . Drug therapy     msucle aches with simvastatin, but tolerates 5mg  Creastor  . Migraines   . Small fiber neuropathy   . Obesity   . Ejection fraction     EF 60%, echo, December 20, 2011  . Fatigue     June, 2013  . Diastolic dysfunction     Echo report, June, 2013  . Diverticulosis of colon (without mention of hemorrhage) 10/03/2007    Colonoscopy  . Internal hemorrhoids without mention of complication 1/88/4166    Colonoscopy  . Reflux esophagitis 04/21/00    EGD  . IBS (irritable bowel syndrome)   . Sleep apnea   . Pancreatitis    Past Surgical History  Procedure Laterality Date  . Laparoscopic ovarian cystectomy  1960's  . Appendectomy  1961  . Shoulder surgery  2004    "frozen"; right  . Trigger finger release  1996    right thumb   History  Substance Use Topics  . Smoking status: Former Smoker -- 1.00 packs/day for 15 years    Types: Cigarettes    Quit date: 07/11/1996  . Smokeless tobacco: Never Used  . Alcohol Use: No   Family History  Problem Relation Age of  Onset  . Coronary artery disease Father   . Heart attack Father   . Heart disease Father     CAD/MI x 3, last fatal  . Diabetes Neg Hx   . Colon cancer Neg Hx   . Breast cancer Paternal Aunt     x 2  . Stroke Mother   . Dementia Mother   . Heart disease Mother     chf  . Obesity Brother   . Hypertension Brother    Allergies  Allergen Reactions  . Codeine Nausea And Vomiting  . Mobic [Meloxicam]     Chest pain   . Nitrofuran Derivatives Nausea And Vomiting  . Celebrex [Celecoxib] Rash   Current Outpatient Prescriptions on File Prior to Visit  Medication Sig Dispense Refill  . aspirin 81 MG tablet Take 81 mg by mouth daily.     . citalopram (CELEXA) 20 MG tablet Take 1/2  tablet by mouth  daily 45 tablet 3  . Coenzyme Q10 (COQ10 PO) Take by mouth.    . diltiazem (CARDIZEM CD) 180 MG 24 hr capsule Take 1 capsule (180 mg total) by mouth  daily. 90 capsule 0  . Fish Oil OIL 1 scoop by Does not apply route daily.    . fluocinonide gel (LIDEX) 2.70 % Apply 1 application topically 2 (two) times daily as needed. for oral ulcers     . gabapentin (NEURONTIN) 300 MG capsule Take 1 capsule by mouth  twice daily 180 capsule 3  . HYDROcodone-acetaminophen (NORCO) 10-325 MG per tablet Take 1 tablet by mouth every 6 (six) hours as needed. For pain 60 tablet 2  . hydrocortisone (ANUSOL-HC) 2.5 % rectal cream Place rectally as needed for hemorrhoids. 30 g 5  . hydrocortisone (ANUSOL-HC) 25 MG suppository Place 1 suppository (25 mg total) rectally 2 (two) times daily. 12 suppository 2  . methocarbamol (ROBAXIN) 500 MG tablet Take 1 tablet (500 mg total) by mouth 4 (four) times daily. 120 tablet 2  . Multiple Vitamin (MULTIVITAMIN WITH MINERALS) TABS Take 1 tablet by mouth daily.    . NON FORMULARY Take 1 capsule by mouth daily. 900mg  daily    . olmesartan-hydrochlorothiazide (BENICAR HCT) 40-25 MG per tablet Take 1 tablet by mouth daily. 90 tablet 0  . polyethylene glycol (MIRALAX / GLYCOLAX) packet Take 17 g by mouth daily.    Orlie Dakin Sodium (STOOL SOFTENER LAXATIVE PO) Take by mouth as needed.    . simvastatin (ZOCOR) 20 MG tablet Take one tablet by mouth in the evenings three times weekly (Mon, Wed, Fri). 36 tablet 3  . temazepam (RESTORIL) 15 MG capsule TAKE ONE CAPSULE BY MOUTH AT BEDTIME AS NEEDED 30 capsule 3  . [DISCONTINUED] diltiazem (DILACOR XR) 180 MG 24 hr capsule Take 1 capsule (180 mg total) by mouth daily. 30 capsule 1   No current facility-administered medications on file prior to visit.        Review of Systems  Review of Systems  Constitutional: Negative for fever, appetite change,  and unexpected weight change.  Eyes: Negative for pain and visual disturbance.  Respiratory: Negative for cough and shortness of breath.   Cardiovascular: Negative for cp or palpitations    Gastrointestinal: Negative for  nausea, diarrhea and constipation.  Genitourinary: Negative for urgency and frequency.  Skin: Negative for pallor or rash   Neurological: Negative for weakness, light-headedness, numbness and headaches.  Hematological: Negative for adenopathy. Does not bruise/bleed easily.  Psychiatric/Behavioral: pos  for dysphoric mood. The patient  is not nervous/anxious.  pos for occ tearfulness        Objective:   Physical Exam  Constitutional: She appears well-developed and well-nourished. No distress.  obese and well appearing    HENT:  Head: Normocephalic and atraumatic.  Mouth/Throat: Oropharynx is clear and moist.  Eyes: Conjunctivae and EOM are normal. Pupils are equal, round, and reactive to light. No scleral icterus.  Neck: Normal range of motion. Neck supple. No JVD present. Carotid bruit is not present. No thyromegaly present.  Cardiovascular: Normal rate, regular rhythm, normal heart sounds and intact distal pulses.   Pulmonary/Chest: Effort normal and breath sounds normal. No respiratory distress. She has no wheezes. She has no rales.  Musculoskeletal: She exhibits tenderness. She exhibits no edema.  Myofascial tenderness noted  No acute joint changes   Lymphadenopathy:    She has no cervical adenopathy.  Neurological: She is alert. She has normal reflexes. No cranial nerve deficit. She exhibits normal muscle tone. Coordination normal.  Skin: Skin is warm and dry. No rash noted. No erythema. No pallor.  Psychiatric: Her speech is normal and behavior is normal. Her mood appears not anxious. Her affect is blunt. Thought content is not paranoid. She exhibits a depressed mood. She expresses no homicidal and no suicidal ideation.          Assessment & Plan:   Problem List Items Addressed This Visit      Cardiovascular and Mediastinum   Essential hypertension - Primary    bp in fair control at this time  BP Readings from Last 1 Encounters:  09/24/14 122/80   No changes  needed Disc lifstyle change with low sodium diet and exercise  Medicines refilled       Relevant Medications   diltiazem (CARDIZEM CD) 24 hr capsule   simvastatin (ZOCOR) tablet   olmesartan-hydrochlorothiazide (BENICAR HCT) 40-25 MG per tablet     Musculoskeletal and Integument   Fibromyalgia    Reviewed and refilled medications More fatigue and brain fog/trouble concentrating lately  Will get back on citalopram  Also get back to her low impact exercise program F/u sept as planned         Other   Depression    Worse lately  Disc option of counseling for grief issues as well Will get back on her citalopram Also get back to regular exercise (has been off track) Update if no improvement       Relevant Medications   citalopram (CELEXA) tablet

## 2014-09-25 NOTE — Assessment & Plan Note (Signed)
Worse lately  Disc option of counseling for grief issues as well Will get back on her citalopram Also get back to regular exercise (has been off track) Update if no improvement

## 2014-09-25 NOTE — Assessment & Plan Note (Signed)
Reviewed and refilled medications More fatigue and brain fog/trouble concentrating lately  Will get back on citalopram  Also get back to her low impact exercise program F/u sept as planned

## 2014-09-25 NOTE — Assessment & Plan Note (Signed)
bp in fair control at this time  BP Readings from Last 1 Encounters:  09/24/14 122/80   No changes needed Disc lifstyle change with low sodium diet and exercise  Medicines refilled

## 2014-09-30 DIAGNOSIS — M7061 Trochanteric bursitis, right hip: Secondary | ICD-10-CM | POA: Diagnosis not present

## 2014-10-01 DIAGNOSIS — M9903 Segmental and somatic dysfunction of lumbar region: Secondary | ICD-10-CM | POA: Diagnosis not present

## 2014-10-01 DIAGNOSIS — M5441 Lumbago with sciatica, right side: Secondary | ICD-10-CM | POA: Diagnosis not present

## 2014-10-29 DIAGNOSIS — M5441 Lumbago with sciatica, right side: Secondary | ICD-10-CM | POA: Diagnosis not present

## 2014-10-29 DIAGNOSIS — M9903 Segmental and somatic dysfunction of lumbar region: Secondary | ICD-10-CM | POA: Diagnosis not present

## 2014-11-03 ENCOUNTER — Encounter: Payer: Self-pay | Admitting: Family Medicine

## 2014-11-03 MED ORDER — HYDROCORTISONE ACETATE 25 MG RE SUPP: 25.0000 mg | Freq: Two times a day (BID) | RECTAL | Status: DC

## 2014-11-03 NOTE — Telephone Encounter (Signed)
Pt left v/m and request cb at [phone]. Hydrocortisone supp # 12 cost to pt is $82.99 because ins will not cover med. Pt wants to know if there is a substitute med that could be sent to [facility].

## 2014-11-03 NOTE — Telephone Encounter (Signed)
Please refill times 3 , thanks 

## 2014-11-03 NOTE — Telephone Encounter (Signed)
Refill sent to pharmacy electronically as instructed.

## 2014-11-24 ENCOUNTER — Telehealth: Payer: Self-pay | Admitting: Family Medicine

## 2014-11-24 NOTE — Telephone Encounter (Signed)
That is fine with me if ok with Dr Ronnald Ramp

## 2014-11-24 NOTE — Telephone Encounter (Signed)
Patient is requesting to transfer from [facility] to [facility].  Please advise.

## 2014-11-27 NOTE — Telephone Encounter (Signed)
No new pts for me right now

## 2014-11-28 DIAGNOSIS — L570 Actinic keratosis: Secondary | ICD-10-CM | POA: Diagnosis not present

## 2014-11-28 DIAGNOSIS — D485 Neoplasm of uncertain behavior of skin: Secondary | ICD-10-CM | POA: Diagnosis not present

## 2014-11-28 DIAGNOSIS — D2262 Melanocytic nevi of left upper limb, including shoulder: Secondary | ICD-10-CM | POA: Diagnosis not present

## 2014-11-28 DIAGNOSIS — D225 Melanocytic nevi of trunk: Secondary | ICD-10-CM | POA: Diagnosis not present

## 2014-11-28 NOTE — Telephone Encounter (Signed)
Left patient vm with Dr. Ronnald Ramp response.

## 2014-12-08 ENCOUNTER — Emergency Department (HOSPITAL_COMMUNITY): Payer: Medicare Other

## 2014-12-08 ENCOUNTER — Observation Stay (HOSPITAL_COMMUNITY)
Admission: EM | Admit: 2014-12-08 | Discharge: 2014-12-09 | Disposition: A | Discharge status: Home / Self Care | Payer: Medicare Other | Attending: Cardiology | Admitting: Cardiology

## 2014-12-08 ENCOUNTER — Encounter (HOSPITAL_COMMUNITY): Payer: Self-pay | Admitting: *Deleted

## 2014-12-08 DIAGNOSIS — Z7982 Long term (current) use of aspirin: Secondary | ICD-10-CM | POA: Diagnosis not present

## 2014-12-08 DIAGNOSIS — R072 Precordial pain: Secondary | ICD-10-CM | POA: Diagnosis not present

## 2014-12-08 DIAGNOSIS — R0789 Other chest pain: Secondary | ICD-10-CM | POA: Diagnosis not present

## 2014-12-08 DIAGNOSIS — K219 Gastro-esophageal reflux disease without esophagitis: Secondary | ICD-10-CM | POA: Diagnosis not present

## 2014-12-08 DIAGNOSIS — E669 Obesity, unspecified: Secondary | ICD-10-CM | POA: Diagnosis not present

## 2014-12-08 DIAGNOSIS — F329 Major depressive disorder, single episode, unspecified: Secondary | ICD-10-CM | POA: Insufficient documentation

## 2014-12-08 DIAGNOSIS — G629 Polyneuropathy, unspecified: Secondary | ICD-10-CM | POA: Diagnosis not present

## 2014-12-08 DIAGNOSIS — Z7952 Long term (current) use of systemic steroids: Secondary | ICD-10-CM | POA: Insufficient documentation

## 2014-12-08 DIAGNOSIS — I10 Essential (primary) hypertension: Secondary | ICD-10-CM | POA: Diagnosis not present

## 2014-12-08 DIAGNOSIS — M519 Unspecified thoracic, thoracolumbar and lumbosacral intervertebral disc disorder: Secondary | ICD-10-CM | POA: Insufficient documentation

## 2014-12-08 DIAGNOSIS — Z79899 Other long term (current) drug therapy: Secondary | ICD-10-CM | POA: Insufficient documentation

## 2014-12-08 DIAGNOSIS — M47892 Other spondylosis, cervical region: Secondary | ICD-10-CM | POA: Insufficient documentation

## 2014-12-08 DIAGNOSIS — Z87891 Personal history of nicotine dependence: Secondary | ICD-10-CM | POA: Insufficient documentation

## 2014-12-08 DIAGNOSIS — G43909 Migraine, unspecified, not intractable, without status migrainosus: Secondary | ICD-10-CM | POA: Diagnosis not present

## 2014-12-08 DIAGNOSIS — K210 Gastro-esophageal reflux disease with esophagitis: Secondary | ICD-10-CM | POA: Diagnosis not present

## 2014-12-08 DIAGNOSIS — E785 Hyperlipidemia, unspecified: Secondary | ICD-10-CM | POA: Diagnosis not present

## 2014-12-08 DIAGNOSIS — G4730 Sleep apnea, unspecified: Secondary | ICD-10-CM | POA: Insufficient documentation

## 2014-12-08 DIAGNOSIS — R079 Chest pain, unspecified: Secondary | ICD-10-CM | POA: Diagnosis not present

## 2014-12-08 LAB — BASIC METABOLIC PANEL
Anion gap: 6 (ref 5–15)
BUN: 16 mg/dL (ref 6–20)
CO2: 27 mmol/L (ref 22–32)
Calcium: 9.4 mg/dL (ref 8.9–10.3)
Chloride: 101 mmol/L (ref 101–111)
Creatinine, Ser: 0.96 mg/dL (ref 0.44–1.00)
GFR calc Af Amer: >60 mL/min (ref >60)
GFR calc non Af Amer: 59 mL/min — ABNORMAL LOW (ref >60)
Glucose, Bld: 100 mg/dL — ABNORMAL HIGH (ref 65–99)
Potassium: 4 mmol/L (ref 3.5–5.1)
Sodium: 134 mmol/L — ABNORMAL LOW (ref 135–145)

## 2014-12-08 LAB — CBC
HCT: 36.6 % (ref 36.0–46.0)
Hemoglobin: 12.2 g/dL (ref 12.0–15.0)
MCH: 30.2 pg (ref 26.0–34.0)
MCHC: 33.3 g/dL (ref 30.0–36.0)
MCV: 90.6 fL (ref 78.0–100.0)
Platelets: 278 10*3/uL (ref 150–400)
RBC: 4.04 MIL/uL (ref 3.87–5.11)
RDW: 13.7 % (ref 11.5–15.5)
WBC: 5.6 10*3/uL (ref 4.0–10.5)

## 2014-12-08 LAB — CBC WITH DIFFERENTIAL/PLATELET
Basophils Absolute: 0 10*3/uL (ref 0.0–0.1)
Basophils Relative: 0 % (ref 0–1)
Eosinophils Absolute: 0.1 10*3/uL (ref 0.0–0.7)
Eosinophils Relative: 2 % (ref 0–5)
HCT: 37.1 % (ref 36.0–46.0)
Hemoglobin: 12.1 g/dL (ref 12.0–15.0)
Lymphocytes Relative: 30 % (ref 12–46)
Lymphs Abs: 1.6 10*3/uL (ref 0.7–4.0)
MCH: 29.7 pg (ref 26.0–34.0)
MCHC: 32.6 g/dL (ref 30.0–36.0)
MCV: 91.2 fL (ref 78.0–100.0)
Monocytes Absolute: 0.3 10*3/uL (ref 0.1–1.0)
Monocytes Relative: 5 % (ref 3–12)
Neutro Abs: 3.4 10*3/uL (ref 1.7–7.7)
Neutrophils Relative %: 63 % (ref 43–77)
Platelets: 274 10*3/uL (ref 150–400)
RBC: 4.07 MIL/uL (ref 3.87–5.11)
RDW: 13.5 % (ref 11.5–15.5)
WBC: 5.4 10*3/uL (ref 4.0–10.5)

## 2014-12-08 LAB — I-STAT CHEM 8, ED
BUN: 18 mg/dL (ref 6–20)
Calcium, Ion: 1.2 mmol/L (ref 1.13–1.30)
Chloride: 98 mmol/L — ABNORMAL LOW (ref 101–111)
Creatinine, Ser: 1.1 mg/dL — ABNORMAL HIGH (ref 0.44–1.00)
Glucose, Bld: 92 mg/dL (ref 65–99)
HCT: 39 % (ref 36.0–46.0)
Hemoglobin: 13.3 g/dL (ref 12.0–15.0)
Potassium: 3.9 mmol/L (ref 3.5–5.1)
Sodium: 134 mmol/L — ABNORMAL LOW (ref 135–145)
TCO2: 24 mmol/L (ref 0–100)

## 2014-12-08 LAB — COMPREHENSIVE METABOLIC PANEL
ALT: 17 U/L (ref 14–54)
AST: 22 U/L (ref 15–41)
Albumin: 3.8 g/dL (ref 3.5–5.0)
Alkaline Phosphatase: 50 U/L (ref 38–126)
Anion gap: 7 (ref 5–15)
BUN: 13 mg/dL (ref 6–20)
CO2: 27 mmol/L (ref 22–32)
Calcium: 9.3 mg/dL (ref 8.9–10.3)
Chloride: 99 mmol/L — ABNORMAL LOW (ref 101–111)
Creatinine, Ser: 0.98 mg/dL (ref 0.44–1.00)
GFR calc Af Amer: >60 mL/min (ref >60)
GFR calc non Af Amer: 58 mL/min — ABNORMAL LOW (ref >60)
Glucose, Bld: 131 mg/dL — ABNORMAL HIGH (ref 65–99)
Potassium: 3.8 mmol/L (ref 3.5–5.1)
Sodium: 133 mmol/L — ABNORMAL LOW (ref 135–145)
Total Bilirubin: 0.6 mg/dL (ref 0.3–1.2)
Total Protein: 6.1 g/dL — ABNORMAL LOW (ref 6.5–8.1)

## 2014-12-08 LAB — I-STAT TROPONIN, ED: Troponin i, poc: 0 ng/mL (ref 0.00–0.08)

## 2014-12-08 LAB — TSH: TSH: 1.308 u[IU]/mL (ref 0.350–4.500)

## 2014-12-08 LAB — TROPONIN I: Troponin I: <0.03 ng/mL (ref <0.031)

## 2014-12-08 MED ORDER — SODIUM CHLORIDE 0.9 % IJ SOLN: 3.0000 mL | INTRAMUSCULAR | Status: DC | PRN

## 2014-12-08 MED ORDER — GABAPENTIN 300 MG PO CAPS
300.0000 mg | ORAL_CAPSULE | Freq: Two times a day (BID) | ORAL | Status: DC
  Administered 2014-12-08 – 2014-12-09 (×2): 300 mg via ORAL
  Filled 2014-12-08 (×2): qty 1

## 2014-12-08 MED ORDER — METHOCARBAMOL 500 MG PO TABS
500.0000 mg | ORAL_TABLET | Freq: Four times a day (QID) | ORAL | Status: DC
  Filled 2014-12-08 (×2): qty 1

## 2014-12-08 MED ORDER — HYDROCHLOROTHIAZIDE 25 MG PO TABS
25.0000 mg | ORAL_TABLET | Freq: Every day | ORAL | Status: DC
  Administered 2014-12-09: 25 mg via ORAL
  Filled 2014-12-08: qty 1

## 2014-12-08 MED ORDER — TEMAZEPAM 15 MG PO CAPS
15.0000 mg | ORAL_CAPSULE | Freq: Every evening | ORAL | Status: DC | PRN
  Administered 2014-12-08: 15 mg via ORAL
  Filled 2014-12-08: qty 1

## 2014-12-08 MED ORDER — POLYETHYLENE GLYCOL 3350 17 G PO PACK: 17.0000 g | PACK | Freq: Every day | ORAL | Status: DC | PRN

## 2014-12-08 MED ORDER — ENOXAPARIN SODIUM 40 MG/0.4ML ~~LOC~~ SOLN
40.0000 mg | SUBCUTANEOUS | Status: DC
Start: 2014-12-08 — End: 2014-12-09
  Administered 2014-12-08: 40 mg via SUBCUTANEOUS
  Filled 2014-12-08: qty 0.4

## 2014-12-08 MED ORDER — ONDANSETRON HCL 4 MG/2ML IJ SOLN: 4.0000 mg | Freq: Four times a day (QID) | INTRAMUSCULAR | Status: DC | PRN

## 2014-12-08 MED ORDER — HYDROCODONE-ACETAMINOPHEN 10-325 MG PO TABS: 1.0000 | ORAL_TABLET | Freq: Four times a day (QID) | ORAL | Status: DC | PRN

## 2014-12-08 MED ORDER — IRBESARTAN 150 MG PO TABS
300.0000 mg | ORAL_TABLET | Freq: Every day | ORAL | Status: DC
  Administered 2014-12-09: 300 mg via ORAL
  Filled 2014-12-08: qty 2

## 2014-12-08 MED ORDER — SODIUM CHLORIDE 0.9 % IV SOLN: 250.0000 mL | INTRAVENOUS | Status: DC | PRN

## 2014-12-08 MED ORDER — ADULT MULTIVITAMIN W/MINERALS CH
1.0000 | ORAL_TABLET | Freq: Every day | ORAL | Status: DC
  Administered 2014-12-08 – 2014-12-09 (×2): 1 via ORAL
  Filled 2014-12-08 (×2): qty 1

## 2014-12-08 MED ORDER — HYDROCORTISONE 2.5 % RE CREA
TOPICAL_CREAM | RECTAL | Status: DC | PRN
  Filled 2014-12-08: qty 28.35

## 2014-12-08 MED ORDER — DILTIAZEM HCL ER COATED BEADS 180 MG PO CP24
180.0000 mg | ORAL_CAPSULE | Freq: Every day | ORAL | Status: DC
  Administered 2014-12-09: 180 mg via ORAL
  Filled 2014-12-08: qty 1

## 2014-12-08 MED ORDER — FLUOCINONIDE 0.05 % EX GEL
1.0000 "application " | Freq: Two times a day (BID) | CUTANEOUS | Status: DC | PRN
  Filled 2014-12-08: qty 15

## 2014-12-08 MED ORDER — CITALOPRAM HYDROBROMIDE 10 MG PO TABS
10.0000 mg | ORAL_TABLET | Freq: Every day | ORAL | Status: DC
  Administered 2014-12-09: 10 mg via ORAL
  Filled 2014-12-08: qty 1

## 2014-12-08 MED ORDER — OLMESARTAN MEDOXOMIL-HCTZ 40-25 MG PO TABS: 1.0000 | ORAL_TABLET | Freq: Every day | ORAL | Status: DC

## 2014-12-08 MED ORDER — ACETAMINOPHEN 500 MG PO TABS: 1000.0000 mg | ORAL_TABLET | Freq: Three times a day (TID) | ORAL | Status: DC | PRN

## 2014-12-08 MED ORDER — SODIUM CHLORIDE 0.9 % IJ SOLN
3.0000 mL | Freq: Two times a day (BID) | INTRAMUSCULAR | Status: DC
Start: 2014-12-08 — End: 2014-12-09
  Administered 2014-12-08 – 2014-12-09 (×2): 3 mL via INTRAVENOUS

## 2014-12-08 MED ORDER — ASPIRIN 81 MG PO CHEW
324.0000 mg | CHEWABLE_TABLET | Freq: Once | ORAL | Status: AC
  Administered 2014-12-08: 324 mg via ORAL
  Filled 2014-12-08: qty 4

## 2014-12-08 MED ORDER — SENNOSIDES-DOCUSATE SODIUM 8.6-50 MG PO TABS: 1.0000 | ORAL_TABLET | ORAL | Status: DC | PRN

## 2014-12-08 MED ORDER — SIMVASTATIN 20 MG PO TABS
20.0000 mg | ORAL_TABLET | Freq: Every day | ORAL | Status: DC
  Administered 2014-12-08: 20 mg via ORAL
  Filled 2014-12-08: qty 2

## 2014-12-08 MED ORDER — ASPIRIN EC 81 MG PO TBEC
81.0000 mg | DELAYED_RELEASE_TABLET | Freq: Every day | ORAL | Status: DC
  Administered 2014-12-09: 81 mg via ORAL
  Filled 2014-12-08 (×2): qty 1

## 2014-12-08 MED ORDER — NITROGLYCERIN 0.4 MG SL SUBL: 0.4000 mg | SUBLINGUAL_TABLET | SUBLINGUAL | Status: DC | PRN

## 2014-12-08 MED ORDER — SIMVASTATIN 20 MG PO TABS: 20.0000 mg | ORAL_TABLET | Freq: Every day | ORAL | Status: DC

## 2014-12-08 NOTE — ED Provider Notes (Signed)
CSN: [id number]     Arrival date & time 12/08/14  1331 History   First MD Initiated Contact with Patient 12/08/14 1418     Chief Complaint  Patient presents with  . Chest Pain     (Consider location/radiation/quality/duration/timing/severity/associated sxs/prior Treatment) The history is provided by the patient and medical records.    This is a 69 year old female with past medical history significant for hyperlipidemia, hypertension, depression, coronary artery disease, migraine headaches, fibromyalgia, peripheral neuropathy, presenting to the ED for chest pain. Patient states she was taking advantage of the long weekend and catching up on some spring cleaning. She states of the past several days she has began having chest pain with exertion. She states she has been washing windows, power washing the deck, mowing the lawn, deep cleaning her home, etc.  When pain occurs, it is a pressure across her upper chest.  She denies associated SOB, diaphoresis, nausea, vomiting, dizziness, or lightheadedness with the pain.  She states this morning she got up and attempted to wash the blinds in her home, however pain became worse and she was concerned so came in for evaluation.  Patient states she generally does not have any chest pain with exertion so this is atypical for her.  Patient's mother and father both had severe CAD and died of MI.  She is a former smoker.  She takes daily ASA but has not taken yet today as she takes it at night.  Patient states she has never had a formal stress test.  She did have a 2D echo in 2013 with estimated 60-65% EF.  Past Medical History  Diagnosis Date  . Peripheral neuropathy   . Fibromyalgia   . Hyperlipemia   . Degenerative joint disease     cervical spine  . GERD (gastroesophageal reflux disease)   . Lumbar disc disease   . Hypertension   . Depression   . Carotid artery disease     Doppler, April, 2012,  3-71% R. ICA, 06-26% LICA... no significant change...  frequency of Doppler moved to yearly because of stability  . Chest pain     Nuclear stress test 2006 normal  . Hx of colonoscopy   . Drug therapy     msucle aches with simvastatin, but tolerates 5mg  Creastor  . Migraines   . Small fiber neuropathy   . Obesity   . Ejection fraction     EF 60%, echo, December 20, 2011  . Fatigue     June, 2013  . Diastolic dysfunction     Echo report, June, 2013  . Diverticulosis of colon (without mention of hemorrhage) 10/03/2007    Colonoscopy  . Internal hemorrhoids without mention of complication 9/48/5462    Colonoscopy  . Reflux esophagitis 04/21/00    EGD  . IBS (irritable bowel syndrome)   . Sleep apnea   . Pancreatitis    Past Surgical History  Procedure Laterality Date  . Laparoscopic ovarian cystectomy  1960's  . Appendectomy  1961  . Shoulder surgery  2004    "frozen"; right  . Trigger finger release  1996    right thumb   Family History  Problem Relation Age of Onset  . Coronary artery disease Father   . Heart attack Father   . Heart disease Father     CAD/MI x 3, last fatal  . Diabetes Neg Hx   . Colon cancer Neg Hx   . Breast cancer Paternal Aunt     x 2  .  Stroke Mother   . Dementia Mother   . Heart disease Mother     chf  . Obesity Brother   . Hypertension Brother    History  Substance Use Topics  . Smoking status: Former Smoker -- 1.00 packs/day for 15 years    Types: Cigarettes    Quit date: 07/11/1996  . Smokeless tobacco: Never Used  . Alcohol Use: No   OB History    No data available     Review of Systems  Cardiovascular: Positive for chest pain.  All other systems reviewed and are negative.     Allergies  Codeine; Mobic; Nitrofuran derivatives; and Celebrex  Home Medications   Prior to Admission medications   Medication Sig Start Date End Date Taking? Authorizing Provider  aspirin 81 MG tablet Take 81 mg by mouth daily.     Historical Provider, MD  citalopram (CELEXA) 20 MG tablet Take 1/2   tablet by mouth  daily 09/24/14   Abner Greenspan, MD  Coenzyme Q10 (COQ10 PO) Take by mouth.    Historical Provider, MD  diltiazem (CARDIZEM CD) 180 MG 24 hr capsule Take 1 capsule (180 mg total) by mouth daily. 09/24/14   Abner Greenspan, MD  Fish Oil OIL 1 scoop by Does not apply route daily.    Historical Provider, MD  fluocinonide gel (LIDEX) 9.52 % Apply 1 application topically 2 (two) times daily as needed. for oral ulcers  03/22/11   Neena Rhymes, MD  gabapentin (NEURONTIN) 300 MG capsule Take 1 capsule by mouth  twice daily 09/24/14   Abner Greenspan, MD  HYDROcodone-acetaminophen [facility]) 10-325 MG per tablet Take 1 tablet by mouth every 6 (six) hours as needed. For pain 02/12/13   Neena Rhymes, MD  hydrocortisone (ANUSOL-HC) 2.5 % rectal cream Place rectally as needed for hemorrhoids. 09/06/13   Neena Rhymes, MD  hydrocortisone (ANUSOL-HC) 25 MG suppository Place 1 suppository (25 mg total) rectally 2 (two) times daily. 11/03/14   Abner Greenspan, MD  methocarbamol (ROBAXIN) 500 MG tablet Take 1 tablet (500 mg total) by mouth 4 (four) times daily. 02/12/13   Neena Rhymes, MD  Misc Natural Products (ADRENAL PO) Take by mouth. Fatigue fighter    Historical Provider, MD  Multiple Vitamin (MULTIVITAMIN WITH MINERALS) TABS Take 1 tablet by mouth daily.    Historical Provider, MD  NON FORMULARY Take 1 capsule by mouth daily. 900mg  daily    Historical Provider, MD  olmesartan-hydrochlorothiazide (BENICAR HCT) 40-25 MG per tablet Take 1 tablet by mouth daily. 09/24/14   Abner Greenspan, MD  polyethylene glycol (MIRALAX / GLYCOLAX) packet Take 17 g by mouth daily.    Historical Provider, MD  Sennosides-Docusate Sodium (STOOL SOFTENER LAXATIVE PO) Take by mouth as needed.    Historical Provider, MD  simvastatin (ZOCOR) 20 MG tablet Take one tablet by mouth in the evenings three times weekly (Mon, Wed, Fri). 09/24/14   Marne A [facility], MD  temazepam (RESTORIL) 15 MG capsule TAKE ONE CAPSULE BY MOUTH AT  BEDTIME AS NEEDED 08/27/14   Wynelle Fanny [facility], MD   BP 139/65 mmHg  Pulse 65  Temp(Src) 97.9 F (36.6 C) (Oral)  Resp 16  SpO2 97%   Physical Exam  Constitutional: She is oriented to person, place, and time. She appears well-developed and well-nourished. No distress.  HENT:  Head: Normocephalic and atraumatic.  Mouth/Throat: Oropharynx is clear and moist.  Eyes: Conjunctivae and EOM are normal. Pupils are equal,  round, and reactive to light.  Neck: Normal range of motion. Neck supple.  Cardiovascular: Normal rate, regular rhythm and normal heart sounds.   Pulmonary/Chest: Effort normal and breath sounds normal. No respiratory distress. She has no wheezes.  Abdominal: Soft. Bowel sounds are normal. There is no tenderness. There is no guarding.  Musculoskeletal: Normal range of motion. She exhibits no edema.  Neurological: She is alert and oriented to person, place, and time.  Skin: Skin is warm and dry. She is not diaphoretic.  Psychiatric: She has a normal mood and affect.  Nursing note and vitals reviewed.   ED Course  Procedures (including critical care time) Labs Review Labs Reviewed  BASIC METABOLIC PANEL - Abnormal; Notable for the following:    Sodium 134 (*)    Glucose, Bld 100 (*)    GFR calc non Af Amer 59 (*)    All other components within normal limits  I-STAT CHEM 8, ED - Abnormal; Notable for the following:    Sodium 134 (*)    Chloride 98 (*)    Creatinine, Ser 1.10 (*)    All other components within normal limits  CBC  I-STAT TROPOININ, ED    Imaging Review Dg Chest 2 View  12/08/2014   CLINICAL DATA:  Intermittent chest pain for 5 days.  EXAM: CHEST  2 VIEW  COMPARISON:  PA and lateral chest 05/26/2012.  FINDINGS: Heart size and mediastinal contours are within normal limits. Both lungs are clear. Visualized skeletal structures are unremarkable.  IMPRESSION: No acute disease.   Electronically Signed   By: Inge Rise M.D.   On: 12/08/2014 14:08     EKG  Interpretation   Date/Time:  Monday Dec 08 2014 13:37:43 EDT Ventricular Rate:  65 PR Interval:  152 QRS Duration: 80 QT Interval:  414 QTC Calculation: 430 R Axis:   48 Text Interpretation:  Normal sinus rhythm Normal ECG No significant change  since last tracing Confirmed by GOLDSTON  MD, SCOTT ([id number]) on 12/08/2014  2:10:45 PM      MDM   Final diagnoses:  Chest pain, unspecified chest pain type   69 year old female with exertional chest pain over the past several days while doing manual labor around her house. She denies any associated symptoms. She denies any pain at rest, no pain currently. EKG sinus rhythm without acute ischemic changes.  Labwork is reassuring, troponin negative. Chest x-ray is clear.  Patient has multiple cardiac RF including HTN, HLP, CAD, former smoker, and strong family cardiac hx.  Case discussed with cardiology-- will evaluate in the ED.  Disposition dependent upon cardiology recommendations.    Larene Pickett, PA-C 12/08/14 1625  Sherwood Gambler, MD 12/08/14 [phone]

## 2014-12-08 NOTE — ED Notes (Signed)
Pt in c/o intermittent chest pain since Thursday, pt is also in epigastric area, worse with exertion, chest pain has been constant the last few days, no distress noted

## 2014-12-08 NOTE — H&P (Signed)
CARDIOLOGY CONSULT NOTE   Patient ID: [name] MRN: [id number], DOB/AGE: Jan 11, 1946   Admit date: 12/08/2014 Date of Consult: 12/08/2014  Primary Physician: Loura Pardon, MD Primary Cardiologist: Dr Ron Parker  Reason for consult:  Chest pain  Problem List  Past Medical History  Diagnosis Date  . Peripheral neuropathy   . Fibromyalgia   . Hyperlipemia   . Degenerative joint disease     cervical spine  . GERD (gastroesophageal reflux disease)   . Lumbar disc disease   . Hypertension   . Depression   . Carotid artery disease     Doppler, April, 2012,  6-43% R. ICA, 32-95% LICA... no significant change... frequency of Doppler moved to yearly because of stability  . Chest pain     Nuclear stress test 2006 normal  . Hx of colonoscopy   . Drug therapy     msucle aches with simvastatin, but tolerates 5mg  Creastor  . Migraines   . Small fiber neuropathy   . Obesity   . Ejection fraction     EF 60%, echo, December 20, 2011  . Fatigue     June, 2013  . Diastolic dysfunction     Echo report, June, 2013  . Diverticulosis of colon (without mention of hemorrhage) 10/03/2007    Colonoscopy  . Internal hemorrhoids without mention of complication 1/88/4166    Colonoscopy  . Reflux esophagitis 04/21/00    EGD  . IBS (irritable bowel syndrome)   . Sleep apnea   . Pancreatitis     Past Surgical History  Procedure Laterality Date  . Laparoscopic ovarian cystectomy  1960's  . Appendectomy  1961  . Shoulder surgery  2004    "frozen"; right  . Trigger finger release  1996    right thumb    Allergies  Allergies  Allergen Reactions  . Codeine Nausea And Vomiting  . Mobic [Meloxicam]     Chest pain   . Nitrofuran Derivatives Nausea And Vomiting  . Celebrex [Celecoxib] Rash    HPI   69 year old female with h/o HTN and HLP followed by Dr Ron Parker in the past for CP, stress test many years ago, negative. She came to the ER for intermittent chest pain in the mid and lower sternum  that can present at rest and on exertion and is not associated with SOB, palpitation , diaphoresis or dizziness. This has been going on for about a week.  She had a nausea/diarrhea - acute GI illness about a week ago and felt that her pain might be secondary to irritated stomach/esophagus. She took pantoprazole in the last 3 days with minimal relief. Her father had multiple MI in his 30' and died in his 37'. She was a smoker, quit 18 years ago. She denies orthopnea, PND, syncope or palpitations.   Inpatient Medications  .  aspirin 81 MG tablet, Take 81 mg by mouth daily. , Disp: , Rfl:  .  citalopram (CELEXA) 20 MG tablet, Take 1/2  tablet by mouth  daily (Patient taking differently: Take 10 mg by mouth daily. Take 1/2  tablet by mouth  daily), Disp: 45 tablet, Rfl: 3 .  Coenzyme Q10 (CO Q 10 PO), Take 10 mLs by mouth daily., Disp: , Rfl:  .  diltiazem (CARDIZEM CD) 180 MG 24 hr capsule,  .  HYDROcodone-acetaminophen (NORCO) 10-325 MG per tablet, Take 1 tablet by mouth every 6 (six) hours as needed. For pain, Disp: 60 tablet, Rfl: 2 .  olmesartan-hydrochlorothiazide (BENICAR  HCT) 40-25 MG per tablet, Take 1 tablet by mouth daily.,  .  simvastatin (ZOCOR) 20 MG tablet,   Family History Family History  Problem Relation Age of Onset  . Coronary artery disease Father   . Heart attack Father   . Heart disease Father     CAD/MI x 3, last fatal  . Diabetes Neg Hx   . Colon cancer Neg Hx   . Breast cancer Paternal Aunt     x 2  . Stroke Mother   . Dementia Mother   . Heart disease Mother     chf  . Obesity Brother   . Hypertension Brother     Social History History   Social History  . Marital Status: Married    Spouse Name: N/A  . Number of Children: 0  . Years of Education: 13   Occupational History  . retired   .     Social History Main Topics  . Smoking status: Former Smoker -- 1.00 packs/day for 15 years    Types: Cigarettes    Quit date: 07/11/1996  . Smokeless tobacco:  Never Used  . Alcohol Use: No  . Drug Use: No  . Sexual Activity: No   Other Topics Concern  . Not on file   Social History Narrative   HS[employer], [employer] school, AA degree [employer]. Married '64- 21 years/divorced; married '98. No children. 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    Review of Systems  General:  No chills, fever, night sweats or weight changes.  Cardiovascular:  No chest pain, dyspnea on exertion, edema, orthopnea, palpitations, paroxysmal nocturnal dyspnea. Dermatological: No rash, lesions/masses Respiratory: No cough, dyspnea Urologic: No hematuria, dysuria Abdominal:   No nausea, vomiting, diarrhea, bright red blood per rectum, melena, or hematemesis Neurologic:  No visual changes, wkns, changes in mental status. All other systems reviewed and are otherwise negative except as noted above.  Physical Exam  Blood pressure 139/55, pulse 63, temperature 97.9 F (36.6 C), temperature source Oral, resp. rate 11, SpO2 100 %.  General: Pleasant, NAD Psych: Normal affect. Neuro: Alert and oriented X 3. Moves all extremities spontaneously. HEENT: Normal  Neck: Supple without bruits or JVD. Lungs:  Resp regular and unlabored, CTA. Heart: RRR no s3, s4, or murmurs. Abdomen: Soft, non-tender, non-distended, BS + x 4.  Extremities: No clubbing, cyanosis or edema. DP/PT/Radials 2+ and equal bilaterally.  Labs  No results for input(s): CKTOTAL, CKMB, TROPONINI in the last 72 hours. Lab Results  Component Value Date   WBC 5.6 12/08/2014   HGB 13.3 12/08/2014   HCT 39.0 12/08/2014   MCV 90.6 12/08/2014   PLT 278 12/08/2014    Recent Labs Lab 12/08/14 1342 12/08/14 1434  NA 134* 134*  K 4.0 3.9  CL 101 98*  CO2 27  --   BUN 16 18  CREATININE 0.96 1.10*  CALCIUM 9.4  --   GLUCOSE 100* 92   Lab Results  Component Value Date   CHOL 188 05/14/2014   HDL 74.60 05/14/2014   LDLCALC 100* 05/14/2014   TRIG 66.0  05/14/2014   Radiology/Studies  Dg Chest 2 View  12/08/2014   CLINICAL DATA:  Intermittent chest pain for 5 days.  EXAM: CHEST  2 VIEW  COMPARISON:  PA and lateral chest 05/26/2012.  FINDINGS: Heart size and mediastinal contours are within normal limits. Both lungs are clear. Visualized skeletal structures are unremarkable.  IMPRESSION: No acute  disease.   Electronically Signed   By: Inge Rise M.D.   On: 12/08/2014 14:08   Echocardiogram - 12/20/2011 Left ventricle: The cavity size was normal. Wall thickness was normal. Systolic function was normal. The estimated ejection fraction was in the range of 60% to 65%. Wall motion was normal; there were no regional wall motion abnormalities. Features are consistent with a pseudonormal left ventricular filling pattern, with concomitant abnormal relaxation and increased filling pressure (grade 2 diastolic dysfunction). - Mitral valve: Very mildly calcified annulus. - Pulmonary arteries: PA peak pressure: 36mm Hg (S).  ECG: SR, normal ECG     ASSESSMENT AND PLAN  69 year old   1. Chest pain - with some typical and some atypical features, negative troponin x1, normal ECG, we will continue to monitor we will schedule an exercise nuclear stress test for the morning - continue ASA, simvastatin, olmesartan - if negative stress test, we will discharge with a referral for GI  2. Hypertension  - controlled on the current regimen  3. HLP - on simvastatin 20 mg po daily   Signed, Dorothy Spark, MD, [facility] 12/08/2014, 3:43 PM

## 2014-12-09 ENCOUNTER — Other Ambulatory Visit (HOSPITAL_COMMUNITY): Payer: Medicare Other

## 2014-12-09 ENCOUNTER — Observation Stay (HOSPITAL_COMMUNITY): Payer: Medicare Other

## 2014-12-09 DIAGNOSIS — R079 Chest pain, unspecified: Secondary | ICD-10-CM | POA: Diagnosis not present

## 2014-12-09 DIAGNOSIS — G629 Polyneuropathy, unspecified: Secondary | ICD-10-CM | POA: Diagnosis not present

## 2014-12-09 DIAGNOSIS — R072 Precordial pain: Secondary | ICD-10-CM | POA: Diagnosis not present

## 2014-12-09 DIAGNOSIS — K210 Gastro-esophageal reflux disease with esophagitis: Secondary | ICD-10-CM

## 2014-12-09 DIAGNOSIS — I10 Essential (primary) hypertension: Secondary | ICD-10-CM | POA: Diagnosis not present

## 2014-12-09 DIAGNOSIS — E785 Hyperlipidemia, unspecified: Secondary | ICD-10-CM | POA: Diagnosis not present

## 2014-12-09 LAB — NM MYOCAR MULTI W/SPECT W/WALL MOTION / EF
LV dias vol: 38 mL
LV sys vol: 6 mL
RATE: 0
SDS: 4
SRS: 1
SSS: 4
TID: 0.83

## 2014-12-09 LAB — TROPONIN I
Troponin I: <0.03 ng/mL (ref <0.031)
Troponin I: <0.03 ng/mL (ref <0.031)

## 2014-12-09 LAB — LIPID PANEL
Cholesterol: 178 mg/dL (ref 0–200)
HDL: 60 mg/dL (ref >40)
LDL Cholesterol: 104 mg/dL — ABNORMAL HIGH (ref 0–99)
Total CHOL/HDL Ratio: 3 RATIO
Triglycerides: 69 mg/dL (ref <150)
VLDL: 14 mg/dL (ref 0–40)

## 2014-12-09 MED ORDER — ALUM & MAG HYDROXIDE-SIMETH 200-200-20 MG/5ML PO SUSP
30.0000 mL | ORAL | Status: DC | PRN
  Administered 2014-12-09 (×2): 30 mL via ORAL
  Filled 2014-12-09 (×2): qty 30

## 2014-12-09 MED ORDER — TECHNETIUM TC 99M SESTAMIBI GENERIC - CARDIOLITE
30.0000 | Freq: Once | INTRAVENOUS | Status: AC | PRN
  Administered 2014-12-09: 30 via INTRAVENOUS

## 2014-12-09 MED ORDER — TECHNETIUM TC 99M SESTAMIBI GENERIC - CARDIOLITE
10.0000 | Freq: Once | INTRAVENOUS | Status: AC | PRN
  Administered 2014-12-09: 10 via INTRAVENOUS

## 2014-12-09 MED ORDER — ATORVASTATIN CALCIUM 10 MG PO TABS: 10.0000 mg | ORAL_TABLET | Freq: Every day | ORAL | Status: DC

## 2014-12-09 NOTE — Discharge Summary (Signed)
Discharge Summary   Patient ID: [name],  MRN: [id number], DOB/AGE: Mar 25, 1946 69 y.o.  Admit date: 12/08/2014 Discharge date: 12/09/2014  Primary Care Provider: [facility] Primary Cardiologist: Dr. Ron Parker  Discharge Diagnoses Principal Problem:   Atypical chest pain Active Problems:   Hyperlipidemia   Essential hypertension   GASTROESOPHAGEAL REFLUX DISEASE   Allergies Allergies  Allergen Reactions  . Codeine Nausea And Vomiting  . Mobic [Meloxicam]     Chest pain   . Nitrofuran Derivatives Nausea And Vomiting  . Celebrex [Celecoxib] Rash    Procedures  NM Myocar Multi W/Spect W/Wall Motion / EF 12/09/14    Interpretation Summary      There was no ST segment deviation noted during stress.  No T wave inversion was noted during stress.  No evidence of ischemia  LV EF = 84%  Normal stress myoview. No evidence of ischemia. Normal LV function with EF = 84%.        Nuclear Stress Findings     Isotope administration The isotope used for nuclear imaging was [id number] Sestamibi. IV Site: right hand.Rest isotope was administered on 12/09/2014 at 09:30 with an IV injection of 10 mCi. Rest SPECT images were obtained approximately 45 minutes post tracer injection. Stress isotope was administered on 12/09/2014 at 11:30 with an IV injection of 30 mCi Stress SPECT images were obtained approximately 30 minutes post tracer injection.    Nuclear Study Quality There is no nuclear artifact present. Overall image quality is good.    Nuclear Measurements Study was gated.    Rest Perfusion There is smooth and normal uptake in all areas of the LV .    Stress Perfusion There is smooth and homogeneous uptake in all areas of the LV    Perfusion Summary There is no evidence of ischemia     History of Present Illness  69 year old female with h/o HTN and HLP followed by Dr Ron Parker in the past for CP, stress test many years ago, negative. She came to the ER  12/08/14 for intermittent chest pain in the mid and lower sternum that can present at rest and on exertion and is not associated with SOB, palpitation , diaphoresis or dizziness. This has been going on for about a week.  She had a nausea/diarrhea - acute GI illness about a week ago and felt that her pain might be secondary to irritated stomach/esophagus. She does have a history of esophagitis in the past. . She took pantoprazole in the last 3 days with minimal relief. Her father had multiple MI in his 85' and died in his 73'. She was a smoker, quit 18 years ago. She denies orthopnea, PND, syncope or palpitations.   Hospital Course  The patient was admitted 5/30 for observation for some typical and some atypical features of ACS, negative troponin x1, normal ECG. Plan for exercise nuclear stress test for the morning. She had a intermittent lower sternum/ right sided chest pain overnight, resolved itself within few minutes. Denied SOB or palpation. She took Maalox overnight that seems to be helped some. Negative troponin x3. TSH was normal. Her home dose of ASA, simvastatin and Benicar- HCT was continued. Her BP remained relatively stable during hospitalization. Her nuclear stress test showed no evidence of ischemia. She discharged 5/31 stably with plan to f/u with GI as soon as possible. She adviced to restart Omperazole that she stopped taking few months ago without any reason.   Discharge Vitals Blood pressure 128/59, pulse 83, temperature  98.5 F (36.9 C), temperature source Oral, resp. rate 15, height 5\' 1"  (1.549 m), weight 158 lb 12.8 oz (72.031 kg), SpO2 97 %.  Filed Weights   12/08/14 1831 12/09/14 0424  Weight: 160 lb 4.8 oz (72.712 kg) 158 lb 12.8 oz (72.031 kg)    Labs  CBC  Recent Labs  12/08/14 1342 12/08/14 1434 12/08/14 1940  WBC 5.6  --  5.4  NEUTROABS  --   --  3.4  HGB 12.2 13.3 12.1  HCT 36.6 39.0 37.1  MCV 90.6  --  91.2  PLT 278  --  789   Basic Metabolic  Panel  Recent Labs  12/08/14 1342 12/08/14 1434 12/08/14 1940  NA 134* 134* 133*  K 4.0 3.9 3.8  CL 101 98* 99*  CO2 27  --  27  GLUCOSE 100* 92 131*  BUN 16 18 13   CREATININE 0.96 1.10* 0.98  CALCIUM 9.4  --  9.3   Liver Function Tests  Recent Labs  12/08/14 1940  AST 22  ALT 17  ALKPHOS 50  BILITOT 0.6  PROT 6.1*  ALBUMIN 3.8   Cardiac Enzymes  Recent Labs  12/08/14 1940 12/09/14 0028 12/09/14 0624  TROPONINI <0.03 <0.03 <0.03   Fasting Lipid Panel  Recent Labs  12/09/14 0028  CHOL 178  HDL 60  LDLCALC 104*  TRIG 69  CHOLHDL 3.0   Thyroid Function Tests  Recent Labs  12/08/14 1940  TSH 1.308    Disposition  Pt is being discharged home today in good condition.  Follow-up Plans & Appointments  Follow-up Information    Follow up with Dola Argyle, MD On 01/14/2015.   Specialty:  Cardiology   Why:  @ 9:45 post hospital   Contact information:   [address] [address] [phone]       Schedule an appointment as soon as possible for a visit with GI doctor.   Contact information:   Make appoiment with you GI doctor sooner rather than later and start you omeprazole tomorrow          Discharge Instructions    Diet - low sodium heart healthy    Complete by:  As directed      Increase activity slowly    Complete by:  As directed   Activity as tolerated.  Avoid NSAID. Start taking your home omeprazole medication.           F/u Labs/Studies: None  Discharge Medications    Medication List    TAKE these medications        acetaminophen 500 MG tablet  Commonly known as:  TYLENOL  Take 1,000 mg by mouth every 8 (eight) hours as needed for mild pain or moderate pain.     aspirin 81 MG tablet  Take 81 mg by mouth daily.     citalopram 20 MG tablet  Commonly known as:  CELEXA  Take 1/2  tablet by mouth  daily     CO Q 10 PO  Take 10 mLs by mouth daily.     diltiazem 180 MG 24 hr capsule   Commonly known as:  CARDIZEM CD  Take 1 capsule (180 mg total) by mouth daily.     Fish Oil Oil  Take 1 scoop by mouth daily.     fluocinonide gel 0.05 %  Commonly known as:  LIDEX  Apply 1 application topically 2 (two) times daily as needed. for oral ulcers  gabapentin 300 MG capsule  Commonly known as:  NEURONTIN  Take 1 capsule by mouth  twice daily     HYDROcodone-acetaminophen 10-325 MG per tablet  Commonly known as:  NORCO  Take 1 tablet by mouth every 6 (six) hours as needed. For pain     hydrocortisone 2.5 % rectal cream  Commonly known as:  ANUSOL-HC  Place rectally as needed for hemorrhoids.     hydrocortisone 25 MG suppository  Commonly known as:  ANUSOL-HC  Place 1 suppository (25 mg total) rectally 2 (two) times daily.     methocarbamol 500 MG tablet  Commonly known as:  ROBAXIN  Take 1 tablet (500 mg total) by mouth 4 (four) times daily.     multivitamin with minerals Tabs tablet  Take 1 tablet by mouth daily.     olmesartan-hydrochlorothiazide 40-25 MG per tablet  Commonly known as:  BENICAR HCT  Take 1 tablet by mouth daily.     polyethylene glycol packet  Commonly known as:  MIRALAX / GLYCOLAX  Take 17 g by mouth daily as needed for mild constipation.     SALONPAS ARTHRITIS PAIN RELIEF EX  Apply 0.5 patches topically daily as needed (Pain).     simvastatin 20 MG tablet  Commonly known as:  ZOCOR  Take one tablet by mouth in the evenings three times weekly (Mon, Wed, Fri).     STOOL SOFTENER LAXATIVE PO  Take by mouth as needed.     temazepam 15 MG capsule  Commonly known as:  RESTORIL  TAKE ONE CAPSULE BY MOUTH AT BEDTIME AS NEEDED        Duration of Discharge Encounter   Greater than 30 minutes including physician time.  Signed, Nancy Arvin PA-C 12/09/2014, 4:24 PM

## 2014-12-09 NOTE — Progress Notes (Signed)
Patient Name: [name] Date of Encounter: 12/09/2014   SUBJECTIVE  She had a intermittent lowe sternum/ Right sided chest pain overnight, resolved itself within few minutes. Denies SOB or palpation. She took Maalox yesterday that seems to be helped some.   CURRENT MEDS . aspirin EC  81 mg Oral Daily  . citalopram  10 mg Oral Daily  . diltiazem  180 mg Oral Daily  . enoxaparin (LOVENOX) injection  40 mg Subcutaneous Q24H  . gabapentin  300 mg Oral BID  . irbesartan  300 mg Oral Daily   And  . hydrochlorothiazide  25 mg Oral Daily  . methocarbamol  500 mg Oral QID  . multivitamin with minerals  1 tablet Oral Daily  . simvastatin  20 mg Oral q1800  . sodium chloride  3 mL Intravenous Q12H    OBJECTIVE  Filed Vitals:   12/08/14 1831 12/08/14 1833 12/08/14 2036 12/09/14 0424  BP:  155/71 123/49 118/54  Pulse:  86 76 80  Temp:  98 F (36.7 C) 98.8 F (37.1 C) 97.8 F (36.6 C)  TempSrc:   Oral Oral  Resp:  18    Height: 5\' 1"  (1.549 m)     Weight: 160 lb 4.8 oz (72.712 kg)   158 lb 12.8 oz (72.031 kg)  SpO2:  100% 98% 100%    Intake/Output Summary (Last 24 hours) at 12/09/14 0743 Last data filed at 12/08/14 1833  Gross per 24 hour  Intake    240 ml  Output      0 ml  Net    240 ml   Filed Weights   12/08/14 1831 12/09/14 0424  Weight: 160 lb 4.8 oz (72.712 kg) 158 lb 12.8 oz (72.031 kg)    PHYSICAL EXAM  General: Pleasant, NAD. Neuro: Alert and oriented X 3. Moves all extremities spontaneously. Psych: Normal affect. HEENT:  Normal  Neck: Supple without bruits or JVD. Lungs:  Resp regular and unlabored, CTA. Heart: RRR no s3, s4, or murmurs. Abdomen: Soft, non-tender, non-distended, BS + x 4.  Extremities: No clubbing, cyanosis or edema. DP/PT/Radials 2+ and equal bilaterally.  Accessory Clinical Findings  CBC  Recent Labs  12/08/14 1342 12/08/14 1434 12/08/14 1940  WBC 5.6  --  5.4  NEUTROABS  --   --  3.4  HGB 12.2 13.3 12.1  HCT 36.6  39.0 37.1  MCV 90.6  --  91.2  PLT 278  --  322   Basic Metabolic Panel  Recent Labs  12/08/14 1342 12/08/14 1434 12/08/14 1940  NA 134* 134* 133*  K 4.0 3.9 3.8  CL 101 98* 99*  CO2 27  --  27  GLUCOSE 100* 92 131*  BUN 16 18 13   CREATININE 0.96 1.10* 0.98  CALCIUM 9.4  --  9.3   Liver Function Tests  Recent Labs  12/08/14 1940  AST 22  ALT 17  ALKPHOS 50  BILITOT 0.6  PROT 6.1*  ALBUMIN 3.8   Cardiac Enzymes  Recent Labs  12/08/14 1940 12/09/14 0028  TROPONINI <0.03 <0.03     Recent Labs  12/09/14 0028  CHOL 178  HDL 60  LDLCALC 104*  TRIG 69  CHOLHDL 3.0   Thyroid Function Tests  Recent Labs  12/08/14 1940  TSH 1.308    TELE  NSR   Radiology/Studies  Dg Chest 2 View  12/08/2014   CLINICAL DATA:  Intermittent chest pain for 5 days.  EXAM: CHEST  2 VIEW  COMPARISON:  PA and lateral chest 05/26/2012.  FINDINGS: Heart size and mediastinal contours are within normal limits. Both lungs are clear. Visualized skeletal structures are unremarkable.  IMPRESSION: No acute disease.   Electronically Signed   By: Inge Rise M.D.   On: 12/08/2014 14:08    ASSESSMENT AND PLAN    1. Chest pain - with some typical and some atypical features - negative troponin x3, normal ECG, TSH normal.  -  Exercise nuclear stress test today - if negative stress test, we will discharge with a referral for GI. She stopped taking her omeprazole about a month ago and since then her symptom seems to be worsen. She also had a history of Esophagitis and this pain kind of similar. I advice her to resume omeprazole daily.  - continue ASA, simvastatin, olmesartan   2. Hypertension  - Relatively controlled on the current regimen  3. HLP -12/09/2014: Cholesterol, Total 178; HDL-C 60; LDL (calc) 104*; Triglycerides 69; VLDL 14  -Continue simvastatin 20 mg po daily  Signed, Bhagat,Bhavinkumar PA-C Pager [phone]   The patient was seen, examined and discussed with  Bhagat,Bhavinkumar PA-C and I agree with the above.   ACS ruled out. Awaiting stress test results, the patient has had intermittent atypical chest pain overnight, mildly relieved by GI coctail. If negative stress test we will restart PPIs daily and schedule an early GI appointment.  Dorothy Spark, MD, [facility] 12/09/2014

## 2014-12-12 ENCOUNTER — Encounter: Payer: Self-pay | Admitting: Gastroenterology

## 2014-12-12 ENCOUNTER — Ambulatory Visit (INDEPENDENT_AMBULATORY_CARE_PROVIDER_SITE_OTHER): Payer: Medicare Other | Admitting: Gastroenterology

## 2014-12-12 VITALS — BP 108/58 | HR 60 | Ht 61.0 in | Wt 160.0 lb

## 2014-12-12 DIAGNOSIS — R0789 Other chest pain: Secondary | ICD-10-CM | POA: Diagnosis not present

## 2014-12-12 NOTE — Progress Notes (Signed)
HPI: This is a  very pleasant 69 year old woman who was sent to me by Abner Greenspan, MD  to evaluate chest pain  .    Chief complaint is chest pain  She was recently hospitalized for a brief stay with chest pains. She underwent cardiac evaluation and she tells me her pains are "noncardiac"  About a week ago, chest pains started. Eventually went to hospital. The pain is achey in chest and also across her abdomen. At a lot of tums and drank milk.  The pains do not seem to be better or worse when she eats.   Had been taking omeprazole, daily for years. Never really had pyrosis. But has had acid taste in her mouth.  Started weight watchers recently in March and stopped the PPI.    Restarted omeprazole in AM30 min prior to BF.  No dysphagia.  Has lost 16 pounds in 3 months.  Chest pains are improving since   Takes no NSAIDs.    Review of systems: Pertinent positive and negative review of systems were noted in the above HPI section. Complete review of systems was performed and was otherwise normal.   Past Medical History  Diagnosis Date  . Peripheral neuropathy   . Fibromyalgia   . Hyperlipemia   . Degenerative joint disease     cervical spine  . GERD (gastroesophageal reflux disease)   . Lumbar disc disease   . Hypertension   . Depression   . Carotid artery disease     Doppler, April, 2012,  8-88% R. ICA, 91-69% LICA... no significant change... frequency of Doppler moved to yearly because of stability  . Chest pain     Nuclear stress test 2006 normal  . Hx of colonoscopy   . Drug therapy     msucle aches with simvastatin, but tolerates 5mg  Creastor  . Migraines   . Small fiber neuropathy   . Obesity   . Ejection fraction     EF 60%, echo, December 20, 2011  . Fatigue     June, 2013  . Diastolic dysfunction     Echo report, June, 2013  . Diverticulosis of colon (without mention of hemorrhage) 10/03/2007    Colonoscopy  . Internal hemorrhoids without mention of  complication 4/50/3888    Colonoscopy  . Reflux esophagitis 04/21/00    EGD  . IBS (irritable bowel syndrome)   . Sleep apnea   . Pancreatitis     Past Surgical History  Procedure Laterality Date  . Laparoscopic ovarian cystectomy  1960's  . Appendectomy  1961  . Shoulder surgery  2004    "frozen"; right  . Trigger finger release  1996    right thumb    Current Outpatient Prescriptions  Medication Sig Dispense Refill  . acetaminophen (TYLENOL) 500 MG tablet Take 1,000 mg by mouth every 8 (eight) hours as needed for mild pain or moderate pain.    Marland Kitchen aspirin 81 MG tablet Take 81 mg by mouth daily.     . citalopram (CELEXA) 20 MG tablet Take 1/2  tablet by mouth  daily (Patient taking differently: Take 10 mg by mouth daily. Take 1/2  tablet by mouth  daily) 45 tablet 3  . Coenzyme Q10 (CO Q 10 PO) Take 10 mLs by mouth daily.    Marland Kitchen diltiazem (CARDIZEM CD) 180 MG 24 hr capsule Take 1 capsule (180 mg total) by mouth daily. 90 capsule 3  . Fish Oil OIL Take 1 scoop by mouth daily.     Marland Kitchen  fluocinonide gel (LIDEX) 1.24 % Apply 1 application topically 2 (two) times daily as needed. for oral ulcers     . gabapentin (NEURONTIN) 300 MG capsule Take 1 capsule by mouth  twice daily 180 capsule 3  . HYDROcodone-acetaminophen (NORCO) 10-325 MG per tablet Take 1 tablet by mouth every 6 (six) hours as needed. For pain 60 tablet 2  . hydrocortisone (ANUSOL-HC) 25 MG suppository Place 1 suppository (25 mg total) rectally 2 (two) times daily. 12 suppository 2  . Liniments (SALONPAS ARTHRITIS PAIN RELIEF EX) Apply 0.5 patches topically daily as needed (Pain).    . methocarbamol (ROBAXIN) 500 MG tablet Take 1 tablet (500 mg total) by mouth 4 (four) times daily. 120 tablet 2  . Multiple Vitamin (MULTIVITAMIN WITH MINERALS) TABS Take 1 tablet by mouth daily.    Marland Kitchen olmesartan-hydrochlorothiazide (BENICAR HCT) 40-25 MG per tablet Take 1 tablet by mouth daily. 90 tablet 3  . polyethylene glycol (MIRALAX /  GLYCOLAX) packet Take 17 g by mouth daily as needed for mild constipation.     Orlie Dakin Sodium (STOOL SOFTENER LAXATIVE PO) Take by mouth as needed.    . simvastatin (ZOCOR) 20 MG tablet Take one tablet by mouth in the evenings three times weekly (Mon, Wed, Fri). 36 tablet 3  . temazepam (RESTORIL) 15 MG capsule TAKE ONE CAPSULE BY MOUTH AT BEDTIME AS NEEDED 30 capsule 3  . [DISCONTINUED] diltiazem (DILACOR XR) 180 MG 24 hr capsule Take 1 capsule (180 mg total) by mouth daily. 30 capsule 1   No current facility-administered medications for this visit.    Allergies as of 12/12/2014 - Review Complete 12/12/2014  Allergen Reaction Noted  . Codeine Nausea And Vomiting   . Mobic [meloxicam]  02/28/2014  . Nitrofuran derivatives Nausea And Vomiting 03/22/2012  . Celebrex [celecoxib] Rash 11/22/2010    Family History  Problem Relation Age of Onset  . Coronary artery disease Father   . Heart attack Father   . Heart disease Father     CAD/MI x 3, last fatal  . Diabetes Neg Hx   . Colon cancer Neg Hx   . Breast cancer Paternal Aunt     x 2  . Stroke Mother   . Dementia Mother   . Heart disease Mother     chf  . Obesity Brother   . Hypertension Brother     History   Social History  . Marital Status: Married    Spouse Name: N/A  . Number of Children: 0  . Years of Education: 13   Occupational History  . retired   .     Social History Main Topics  . Smoking status: Former Smoker -- 1.00 packs/day for 15 years    Types: Cigarettes    Quit date: 07/11/1996  . Smokeless tobacco: Never Used  . Alcohol Use: No  . Drug Use: No  . Sexual Activity: No   Other Topics Concern  . Not on file   Social History Narrative   [employer], [employer] school, AA degree [employer]. Married '64- 21 years/divorced; married '98. No children. 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     Physical Exam: BP 108/58 mmHg  Pulse 60  Ht 5'  1" (1.549 m)  Wt 160 lb (72.576 kg)  BMI 30.25 kg/m2 Constitutional: generally well-appearing Psychiatric: alert and oriented x3 Eyes: extraocular movements intact Mouth: oral pharynx moist, no lesions Neck: supple no lymphadenopathy Cardiovascular: heart regular  rate and rhythm Lungs: clear to auscultation bilaterally Abdomen: soft, nontender, nondistended, no obvious ascites, no peritoneal signs, normal bowel sounds Extremities: no lower extremity edema bilaterally Skin: no lesions on visible extremities   Assessment and plan: 69 y.o. female with   chest pain, possibly from GERD it does seem that she has an issue with acid. Her chest pains have significantly improved since she restarted her proton pump inhibitor once daily. She has no alarm symptoms and so I recommended she simply stay on her proton pump inhibitor once daily as she had been doing for the past many years. She will call to report on her response in 4-5 weeks. If she is back to normal and there is no need for further testing. If she does not feel back to her usual and is still having intermittent chest pains, GERD issues then I think I would like to proceed with EGD to assess her esophagus, check for gastritis etc. [facility] 12/12/2014, 3:30 PM  Cc: Tower, Wynelle Fanny, MD

## 2014-12-12 NOTE — Patient Instructions (Signed)
Stay on omeprazole one pill 20-30 min before BF meal. Call in 4-5 weeks to report on your symptoms.

## 2015-01-05 ENCOUNTER — Other Ambulatory Visit: Payer: Self-pay

## 2015-01-12 ENCOUNTER — Encounter: Payer: Self-pay | Admitting: Cardiology

## 2015-01-12 DIAGNOSIS — R072 Precordial pain: Secondary | ICD-10-CM | POA: Insufficient documentation

## 2015-01-14 ENCOUNTER — Ambulatory Visit (INDEPENDENT_AMBULATORY_CARE_PROVIDER_SITE_OTHER): Payer: Medicare Other | Admitting: Cardiology

## 2015-01-14 ENCOUNTER — Encounter: Payer: Self-pay | Admitting: Cardiology

## 2015-01-14 VITALS — BP 118/70 | HR 67 | Ht 64.0 in | Wt 160.0 lb

## 2015-01-14 DIAGNOSIS — I779 Disorder of arteries and arterioles, unspecified: Secondary | ICD-10-CM | POA: Diagnosis not present

## 2015-01-14 DIAGNOSIS — I10 Essential (primary) hypertension: Secondary | ICD-10-CM | POA: Diagnosis not present

## 2015-01-14 DIAGNOSIS — R072 Precordial pain: Secondary | ICD-10-CM

## 2015-01-14 DIAGNOSIS — I739 Peripheral vascular disease, unspecified: Secondary | ICD-10-CM

## 2015-01-14 MED ORDER — HYDROCHLOROTHIAZIDE 25 MG PO TABS: 25.0000 mg | ORAL_TABLET | Freq: Every day | ORAL | Status: AC

## 2015-01-14 MED ORDER — LOSARTAN POTASSIUM 100 MG PO TABS: 100.0000 mg | ORAL_TABLET | Freq: Every day | ORAL | Status: AC

## 2015-01-14 NOTE — Assessment & Plan Note (Signed)
The patient has known carotid disease. This has been quite stable. We decided that she could have the study every 2 years. Her last study was April, 2015. Disease was stable then. She will be due to have follow-up carotid Doppler in April, 2017.

## 2015-01-14 NOTE — Patient Instructions (Signed)
**Note De-Identified Seraj Dunnam Obfuscation** Medication Instructions:  When you finish current bottle of Cardizem stop taking. Stop taking Benicar/HCTZ and start taking Losartan 100 mg daily and Hydrochlorothiazide 25 mg daily when you finish current supply of Benicar/HCTZ    Labwork: None  Testing/Procedures: None  Follow-Up: Your physician wants you to follow-up in: 1 year. You will receive a reminder letter in the mail two months in advance. If you don't receive a letter, please call our office to schedule the follow-up appointment.]

## 2015-01-14 NOTE — Assessment & Plan Note (Signed)
Her nuclear study in the hospital in May, 2016 revealed no significant ischemia. Ejection fraction was normal. No further workup.

## 2015-01-14 NOTE — Progress Notes (Signed)
Cardiology Office Note   Date:  01/14/2015   ID:  [name], DOB [birth year], MRN [id number]  PCP:  Catherine Pardon, MD  Cardiologist:  Catherine Argyle, MD   Chief Complaint  Patient presents with  . Appointment    Follow-up hospital visit after admission for chest discomfort      History of Present Illness: [name] is a 69 y.o. female who presents today to follow-up post hospitalization. She does not have documented coronary disease. She had some chest discomfort and was admitted for evaluation. During the hospitalization the nuclear stress study was normal. She was discharged home. It is clear now that her symptoms were related to GERD at that time. She is feeling better.  She has concerns about the cost of her blood pressure medicines. We will make some adjustments that will take affect after the current medicines that she has run out.  The patient will be changing primary care physicians in October, 2016. She will be seeing [name].    Past Medical History  Diagnosis Date  . Peripheral neuropathy   . Fibromyalgia   . Hyperlipemia   . Degenerative joint disease     cervical spine  . GERD (gastroesophageal reflux disease)   . Lumbar disc disease   . Hypertension   . Depression   . Carotid artery disease     Doppler, April, 2012,  0-10% R. ICA, 93-23% LICA... no significant change... frequency of Doppler moved to yearly because of stability  . Chest pain     Nuclear stress test 2006 normal  . Hx of colonoscopy   . Drug therapy     msucle aches with simvastatin, but tolerates 5mg  Creastor  . Migraines   . Small fiber neuropathy   . Obesity   . Ejection fraction     EF 60%, echo, December 20, 2011  . Fatigue     June, 2013  . Diastolic dysfunction     Echo report, June, 2013  . Diverticulosis of colon (without mention of hemorrhage) 10/03/2007    Colonoscopy  . Internal hemorrhoids without mention of complication 5/57/3220    Colonoscopy  . Reflux  esophagitis 04/21/00    EGD  . IBS (irritable bowel syndrome)   . Sleep apnea   . Pancreatitis     Past Surgical History  Procedure Laterality Date  . Laparoscopic ovarian cystectomy  1960's  . Appendectomy  1961  . Shoulder surgery  2004    "frozen"; right  . Trigger finger release  1996    right thumb    Patient Active Problem List   Diagnosis Date Noted  . Precordial chest pain 01/12/2015  . Fatigue   . Diastolic dysfunction   . Ejection fraction   . Routine health maintenance 03/26/2011  . Carotid artery disease   . Drug therapy   . PEDAL EDEMA 03/17/2010  . INSOMNIA, CHRONIC 04/17/2009  . OSTEOARTHROSIS UNSPEC WHETHER GEN/LOC ANK&FOOT 01/05/2009  . Depression 05/22/2008  . TINNITUS 08/30/2007  . Hyperlipidemia 04/05/2007  . PERIPHERAL NEUROPATHY 04/05/2007  . Essential hypertension 04/05/2007  . GASTROESOPHAGEAL REFLUX DISEASE 04/05/2007  . DEGENERATIVE JOINT DISEASE, CERVICAL SPINE 04/05/2007  . [place] DISEASE, LUMBAR 04/05/2007  . Fibromyalgia 04/05/2007  . MIGRAINES, HX OF 04/05/2007  . APPENDECTOMY, HX OF 04/05/2007      Current Outpatient Prescriptions  Medication Sig Dispense Refill  . acetaminophen (TYLENOL) 500 MG tablet Take 1,000 mg by mouth every 8 (eight) hours as needed for mild pain or  moderate pain.    Marland Kitchen aspirin 81 MG tablet Take 81 mg by mouth daily.     . citalopram (CELEXA) 20 MG tablet Take 1/2  tablet by mouth  daily (Patient taking differently: Take 10 mg by mouth daily. Take 1/2  tablet by mouth  daily) 45 tablet 3  . Coenzyme Q10 (CO Q 10 PO) Take 10 mLs by mouth daily.    Marland Kitchen diltiazem (CARDIZEM CD) 180 MG 24 hr capsule Take 1 capsule (180 mg total) by mouth daily. 90 capsule 3  . Fish Oil OIL Take 1 scoop by mouth daily.     Marland Kitchen gabapentin (NEURONTIN) 300 MG capsule Take 1 capsule by mouth  twice daily 180 capsule 3  . HYDROcodone-acetaminophen (NORCO) 10-325 MG per tablet Take 1 tablet by mouth every 6 (six) hours as needed. For pain 60  tablet 2  . Liniments (SALONPAS ARTHRITIS PAIN RELIEF EX) Apply 0.5 patches topically daily as needed (Pain).    . methocarbamol (ROBAXIN) 500 MG tablet Take 500 mg by mouth 4 (four) times daily as needed for muscle spasms.    . Multiple Vitamin (MULTIVITAMIN WITH MINERALS) TABS Take 1 tablet by mouth daily.    Marland Kitchen olmesartan-hydrochlorothiazide (BENICAR HCT) 40-25 MG per tablet Take 1 tablet by mouth daily. 90 tablet 3  . omeprazole (PRILOSEC) 40 MG capsule Take 40 mg by mouth daily.    . polyethylene glycol (MIRALAX / GLYCOLAX) packet Take 17 g by mouth daily as needed for mild constipation.     [name] Sodium (STOOL SOFTENER LAXATIVE PO) Take 1 capsule by mouth as needed (CONSTIPATION).     Marland Kitchen simvastatin (ZOCOR) 20 MG tablet Take one tablet by mouth in the evenings three times weekly (Mon, Wed, Fri). 36 tablet 3  . temazepam (RESTORIL) 15 MG capsule TAKE ONE CAPSULE BY MOUTH AT BEDTIME AS NEEDED 30 capsule 3  . [DISCONTINUED] diltiazem (DILACOR XR) 180 MG 24 hr capsule Take 1 capsule (180 mg total) by mouth daily. 30 capsule 1   No current facility-administered medications for this visit.    Allergies:   Codeine; Mobic; Nitrofuran derivatives; and Celebrex    Social History:  The patient  reports that she quit smoking about 18 years ago. Her smoking use included Cigarettes. She has a 15 pack-year smoking history. She has never used smokeless tobacco. She reports that she does not drink alcohol or use illicit drugs.   Family History:  The patient's family history includes Breast cancer in her paternal aunt; Coronary artery disease in her father; Dementia in her mother; Heart attack in her father; Heart disease in her father and mother; Hypertension in her brother; Obesity in her brother; Stroke in her mother. There is no history of Diabetes or Colon cancer.    ROS:  Please see the history of present illness.     Patient denies fever, chills, headache, sweats, rash, change in  vision, change in hearing, chest pain, cough, nausea or vomiting, urinary symptoms. All other systems are reviewed and are negative.   PHYSICAL EXAM: VS:  BP 118/70 mmHg  Pulse 67  Ht 5\' 4"  (1.626 m)  Wt 160 lb (72.576 kg)  BMI 27.45 kg/m2  SpO2 98% , Patient looks quite good today. She is oriented to person time and place. Affect is normal. Head is atraumatic. Sclera and conjunctiva are normal. There is no jugulovenous distention. Lungs are clear. Respiratory effort is nonlabored. Cardiac exam reveals S1 and S2. The abdomen is soft. There is  no peripheral edema. There are no musculoskeletal deformities. There are no skin rashes. Neurologic is grossly intact.  EKG:   EKG is not done today.   Recent Labs: 12/08/2014: ALT 17; BUN 13; Creatinine, Ser 0.98; Hemoglobin 12.1; Platelets 274; Potassium 3.8; Sodium 133*; TSH 1.308    Lipid Panel    Component Value Date/Time   CHOL 178 12/09/2014 0028   TRIG 69 12/09/2014 0028   TRIG 63 06/19/2006 0922   HDL 60 12/09/2014 0028   CHOLHDL 3.0 12/09/2014 0028   CHOLHDL 2.7 CALC 06/19/2006 0922   VLDL 14 12/09/2014 0028   LDLCALC 104* 12/09/2014 0028      Wt Readings from Last 3 Encounters:  01/14/15 160 lb (72.576 kg)  12/12/14 160 lb (72.576 kg)  12/09/14 158 lb 12.8 oz (72.031 kg)      Current medicines are reviewed  The patient understands her medicines.     ASSESSMENT AND PLAN:

## 2015-01-14 NOTE — Assessment & Plan Note (Signed)
Patient says that her medications are too expensive. She is on a combination medicine of Benicar and hydrochlorothiazide. She just received a 90 day supply. We will change this medication so that when it is time for her next renewal, she will receive losartan 100 mg. She will receive a separate pill of hydrochlorothiazide 25 mg. We will stop her diltiazem at that time as I have put her on a higher dose of ARB. Soon after that time she will have an appointment with Dr. Mertha Finders. If her blood pressure is elevated. I would suggest adding back a calcium channel blocker in the form of amlodipine 2.5 mg that would be very inexpensive for her.

## 2015-01-17 DIAGNOSIS — M7061 Trochanteric bursitis, right hip: Secondary | ICD-10-CM | POA: Diagnosis not present

## 2015-01-19 ENCOUNTER — Other Ambulatory Visit: Payer: Self-pay | Admitting: *Deleted

## 2015-01-19 MED ORDER — OMEPRAZOLE 40 MG PO CPDR: 40.0000 mg | DELAYED_RELEASE_CAPSULE | Freq: Every day | ORAL | Status: DC

## 2015-02-06 DIAGNOSIS — Z803 Family history of malignant neoplasm of breast: Secondary | ICD-10-CM | POA: Diagnosis not present

## 2015-02-06 DIAGNOSIS — Z1231 Encounter for screening mammogram for malignant neoplasm of breast: Secondary | ICD-10-CM | POA: Diagnosis not present

## 2015-02-15 ENCOUNTER — Encounter (HOSPITAL_COMMUNITY): Payer: Self-pay | Admitting: Emergency Medicine

## 2015-02-15 ENCOUNTER — Emergency Department (HOSPITAL_COMMUNITY)
Admission: EM | Admit: 2015-02-15 | Discharge: 2015-02-15 | Disposition: A | Discharge status: Home / Self Care | Payer: Medicare Other | Attending: Emergency Medicine | Admitting: Emergency Medicine

## 2015-02-15 ENCOUNTER — Emergency Department (HOSPITAL_COMMUNITY): Payer: Medicare Other

## 2015-02-15 DIAGNOSIS — R519 Headache, unspecified: Secondary | ICD-10-CM

## 2015-02-15 DIAGNOSIS — R253 Fasciculation: Secondary | ICD-10-CM | POA: Insufficient documentation

## 2015-02-15 DIAGNOSIS — Z8739 Personal history of other diseases of the musculoskeletal system and connective tissue: Secondary | ICD-10-CM | POA: Diagnosis not present

## 2015-02-15 DIAGNOSIS — I10 Essential (primary) hypertension: Secondary | ICD-10-CM | POA: Diagnosis not present

## 2015-02-15 DIAGNOSIS — K219 Gastro-esophageal reflux disease without esophagitis: Secondary | ICD-10-CM | POA: Insufficient documentation

## 2015-02-15 DIAGNOSIS — G43909 Migraine, unspecified, not intractable, without status migrainosus: Secondary | ICD-10-CM | POA: Insufficient documentation

## 2015-02-15 DIAGNOSIS — E669 Obesity, unspecified: Secondary | ICD-10-CM | POA: Insufficient documentation

## 2015-02-15 DIAGNOSIS — G479 Sleep disorder, unspecified: Secondary | ICD-10-CM | POA: Diagnosis not present

## 2015-02-15 DIAGNOSIS — E785 Hyperlipidemia, unspecified: Secondary | ICD-10-CM | POA: Insufficient documentation

## 2015-02-15 DIAGNOSIS — F329 Major depressive disorder, single episode, unspecified: Secondary | ICD-10-CM | POA: Diagnosis not present

## 2015-02-15 DIAGNOSIS — Z7982 Long term (current) use of aspirin: Secondary | ICD-10-CM | POA: Insufficient documentation

## 2015-02-15 DIAGNOSIS — Z79899 Other long term (current) drug therapy: Secondary | ICD-10-CM | POA: Diagnosis not present

## 2015-02-15 DIAGNOSIS — Z87891 Personal history of nicotine dependence: Secondary | ICD-10-CM | POA: Insufficient documentation

## 2015-02-15 DIAGNOSIS — R51 Headache: Secondary | ICD-10-CM

## 2015-02-15 LAB — COMPREHENSIVE METABOLIC PANEL
ALT: 16 U/L (ref 14–54)
AST: 23 U/L (ref 15–41)
Albumin: 4 g/dL (ref 3.5–5.0)
Alkaline Phosphatase: 59 U/L (ref 38–126)
Anion gap: 9 (ref 5–15)
BUN: 15 mg/dL (ref 6–20)
CO2: 26 mmol/L (ref 22–32)
Calcium: 9.5 mg/dL (ref 8.9–10.3)
Chloride: 98 mmol/L — ABNORMAL LOW (ref 101–111)
Creatinine, Ser: 0.98 mg/dL (ref 0.44–1.00)
GFR calc Af Amer: >60 mL/min (ref >60)
GFR calc non Af Amer: 57 mL/min — ABNORMAL LOW (ref >60)
Glucose, Bld: 100 mg/dL — ABNORMAL HIGH (ref 65–99)
Potassium: 4.2 mmol/L (ref 3.5–5.1)
Sodium: 133 mmol/L — ABNORMAL LOW (ref 135–145)
Total Bilirubin: 0.4 mg/dL (ref 0.3–1.2)
Total Protein: 6.3 g/dL — ABNORMAL LOW (ref 6.5–8.1)

## 2015-02-15 LAB — CBC WITH DIFFERENTIAL/PLATELET
Basophils Absolute: 0 10*3/uL (ref 0.0–0.1)
Basophils Relative: 0 % (ref 0–1)
Eosinophils Absolute: 0.1 10*3/uL (ref 0.0–0.7)
Eosinophils Relative: 1 % (ref 0–5)
HCT: 37.4 % (ref 36.0–46.0)
Hemoglobin: 12.5 g/dL (ref 12.0–15.0)
Lymphocytes Relative: 17 % (ref 12–46)
Lymphs Abs: 1.3 10*3/uL (ref 0.7–4.0)
MCH: 30.7 pg (ref 26.0–34.0)
MCHC: 33.4 g/dL (ref 30.0–36.0)
MCV: 91.9 fL (ref 78.0–100.0)
Monocytes Absolute: 0.6 10*3/uL (ref 0.1–1.0)
Monocytes Relative: 8 % (ref 3–12)
Neutro Abs: 5.7 10*3/uL (ref 1.7–7.7)
Neutrophils Relative %: 74 % (ref 43–77)
Platelets: 266 10*3/uL (ref 150–400)
RBC: 4.07 MIL/uL (ref 3.87–5.11)
RDW: 13.2 % (ref 11.5–15.5)
WBC: 7.6 10*3/uL (ref 4.0–10.5)

## 2015-02-15 LAB — SEDIMENTATION RATE: Sed Rate: 14 mm/hr (ref 0–22)

## 2015-02-15 MED ORDER — TEMAZEPAM 7.5 MG PO CAPS: 7.5000 mg | ORAL_CAPSULE | Freq: Every evening | ORAL | Status: DC | PRN

## 2015-02-15 NOTE — ED Provider Notes (Signed)
CSN: [id number]     Arrival date & time 02/15/15  1036 History   First MD Initiated Contact with Patient 02/15/15 1057     Chief Complaint  Patient presents with  . Headache    no pain just twitching     (Consider location/radiation/quality/duration/timing/severity/associated sxs/prior Treatment) HPI Comments: Patient presents to the emergency department for evaluation of twitching of her scalp and head with discomfort. Patient reports that she has been noticing intermittent episodes of feeling like her head is twitching. At times it goes down into the neck. It started on the right side of her head, but does occur globally. She has not identified any causal factors. Patient has not had any vision change. There is no numbness, drooling or weakness of extremities.  Patient reports that she has not been sleeping well the last couple of nights. She normally takes temazepam for sleep, but started to have memory loss, so 2 nights ago she stopped taking it. She had been taking it nightly for a long period of time. Symptoms began the next day.  Patient is a 69 y.o. female presenting with headaches.  Headache   Past Medical History  Diagnosis Date  . Peripheral neuropathy   . Fibromyalgia   . Hyperlipemia   . Degenerative joint disease     cervical spine  . GERD (gastroesophageal reflux disease)   . Lumbar disc disease   . Hypertension   . Depression   . Carotid artery disease     Doppler, April, 2012,  1-28% R. ICA, 78-67% LICA... no significant change... frequency of Doppler moved to yearly because of stability  . Chest pain     Nuclear stress test 2006 normal  . Hx of colonoscopy   . Drug therapy     msucle aches with simvastatin, but tolerates 5mg  Creastor  . Migraines   . Small fiber neuropathy   . Obesity   . Ejection fraction     EF 60%, echo, December 20, 2011  . Fatigue     June, 2013  . Diastolic dysfunction     Echo report, June, 2013  . Diverticulosis of colon (without  mention of hemorrhage) 10/03/2007    Colonoscopy  . Internal hemorrhoids without mention of complication 6/72/0947    Colonoscopy  . Reflux esophagitis 04/21/00    EGD  . IBS (irritable bowel syndrome)   . Sleep apnea   . Pancreatitis    Past Surgical History  Procedure Laterality Date  . Laparoscopic ovarian cystectomy  1960's  . Appendectomy  1961  . Shoulder surgery  2004    "frozen"; right  . Trigger finger release  1996    right thumb   Family History  Problem Relation Age of Onset  . Coronary artery disease Father   . Heart attack Father   . Heart disease Father     CAD/MI x 3, last fatal  . Diabetes Neg Hx   . Colon cancer Neg Hx   . Breast cancer Paternal Aunt     x 2  . Stroke Mother   . Dementia Mother   . Heart disease Mother     chf  . Obesity Brother   . Hypertension Brother    History  Substance Use Topics  . Smoking status: Former Smoker -- 1.00 packs/day for 15 years    Types: Cigarettes    Quit date: 07/11/1996  . Smokeless tobacco: Never Used  . Alcohol Use: No   OB History    No  data available     Review of Systems  Neurological: Positive for headaches.       Twitching  All other systems reviewed and are negative.     Allergies  Codeine; Mobic; Nitrofuran derivatives; and Celebrex  Home Medications   Prior to Admission medications   Medication Sig Start Date End Date Taking? Authorizing Provider  acetaminophen (TYLENOL) 500 MG tablet Take 1,000 mg by mouth every 8 (eight) hours as needed for mild pain or moderate pain.    Historical Provider, MD  aspirin 81 MG tablet Take 81 mg by mouth daily.     Historical Provider, MD  citalopram (CELEXA) 20 MG tablet Take 1/2  tablet by mouth  daily Patient taking differently: Take 10 mg by mouth daily. Take 1/2  tablet by mouth  daily 09/24/14   Abner Greenspan, MD  Coenzyme Q10 (CO Q 10 PO) Take 10 mLs by mouth daily.    Historical Provider, MD  Fish Oil OIL Take 1 scoop by mouth daily.      Historical Provider, MD  gabapentin (NEURONTIN) 300 MG capsule Take 1 capsule by mouth  twice daily 09/24/14   Abner Greenspan, MD  hydrochlorothiazide (HYDRODIURIL) 25 MG tablet Take 1 tablet (25 mg total) by mouth daily. 01/14/15   Carlena Bjornstad, MD  HYDROcodone-acetaminophen (NORCO) 10-325 MG per tablet Take 1 tablet by mouth every 6 (six) hours as needed. For pain 02/12/13   Neena Rhymes, MD  Lin[facility] ARTHRITIS PAIN RELIEF EX) Apply 0.5 patches topically daily as needed (Pain).    Historical Provider, MD  losartan (COZAAR) 100 MG tablet Take 1 tablet (100 mg total) by mouth daily. 01/14/15   Carlena Bjornstad, MD  methocarbamol (ROBAXIN) 500 MG tablet Take 500 mg by mouth 4 (four) times daily as needed for muscle spasms.    Historical Provider, MD  Multiple Vitamin (MULTIVITAMIN WITH MINERALS) TABS Take 1 tablet by mouth daily.    Historical Provider, MD  omeprazole (PRILOSEC) 40 MG capsule Take 1 capsule (40 mg total) by mouth daily. 01/19/15   Abner Greenspan, MD  polyethylene glycol (MIRALAX / GLYCOLAX) packet Take 17 g by mouth daily as needed for mild constipation.     Historical Provider, MD  Sennosides-Docusate Sodium (STOOL SOFTENER LAXATIVE PO) Take 1 capsule by mouth as needed (CONSTIPATION).     Historical Provider, MD  simvastatin (ZOCOR) 20 MG tablet Take one tablet by mouth in the evenings three times weekly (Mon, Wed, Fri). 09/24/14   Abner Greenspan, MD  temazepam (RESTORIL) 15 MG capsule TAKE ONE CAPSULE BY MOUTH AT BEDTIME AS NEEDED 08/27/14   Wynelle Fanny Tower, MD   BP 146/67 mmHg  Pulse 64  Temp(Src) 98.2 F (36.8 C) (Oral)  Resp 15  Ht 5\' 1"  (1.549 m)  Wt 154 lb (69.854 kg)  BMI 29.11 kg/m2  SpO2 100% Physical Exam  Constitutional: She is oriented to person, place, and time. She appears well-developed and well-nourished. No distress.  HENT:  Head: Normocephalic and atraumatic.  Right Ear: Hearing normal.  Left Ear: Hearing normal.  Nose: Nose normal.  Mouth/Throat:  Oropharynx is clear and moist and mucous membranes are normal.  Eyes: Conjunctivae and EOM are normal. Pupils are equal, round, and reactive to light.  Neck: Normal range of motion. Neck supple.  Cardiovascular: Regular rhythm, S1 normal and S2 normal.  Exam reveals no gallop and no friction rub.   No murmur heard. Pulmonary/Chest: Effort normal and breath  sounds normal. No respiratory distress. She exhibits no tenderness.  Abdominal: Soft. Normal appearance and bowel sounds are normal. There is no hepatosplenomegaly. There is no tenderness. There is no rebound, no guarding, no tenderness at McBurney's point and negative Murphy's sign. No hernia.  Musculoskeletal: Normal range of motion.  Neurological: She is alert and oriented to person, place, and time. She has normal strength. No cranial nerve deficit or sensory deficit. Coordination normal. GCS eye subscore is 4. GCS verbal subscore is 5. GCS motor subscore is 6.  Extraocular muscle movement: normal No visual field cut Pupils: equal and reactive both direct and consensual response is normal No nystagmus present    Sensory function is intact to light touch, pinprick Proprioception intact  Grip strength 5/5 symmetric in upper extremities No pronator drift Normal finger to nose bilaterally  Lower extremity strength 5/5 against gravity Normal heel to shin bilaterally  Gait: normal   Skin: Skin is warm, dry and intact. No rash noted. No cyanosis.  Psychiatric: She has a normal mood and affect. Her speech is normal and behavior is normal. Thought content normal.  Nursing note and vitals reviewed.   ED Course  Procedures (including critical care time) Labs Review Labs Reviewed  CBC WITH DIFFERENTIAL/PLATELET  COMPREHENSIVE METABOLIC PANEL  SEDIMENTATION RATE    Imaging Review No results found.   EKG Interpretation None      MDM   Final diagnoses:  Headache    Patient presents to the ER for evaluation of symptoms  that she can only describe as "twitching". She will categorize it as headache or pain, however. Symptoms occur primarily on the head region, but she has had some twitching of her neck as well. She is awake and alert when this occurs. There has not been anything to suggest seizure activity. Patient reports the symptoms began after she stopped taking temazepam. She has been taking it nightly for sleep, stopped taking it 2 nights ago. She reports that she has not been able to sleep well since. Symptoms are likely secondary to the sleeplessness, although cannot rule out withdrawal symptoms. This is not, however, consistent with seizure. Workup today has been unremarkable. Will prescribe low-dose Restoril at 7.5 mg at night time, attempt weaning off rather than stopping suddenly. Follow-up with PCP in the office.   Orpah Greek, MD 02/15/15 [phone]

## 2015-02-15 NOTE — Discharge Instructions (Signed)

## 2015-02-15 NOTE — ED Notes (Signed)
Pt. Stated, I started yesterday having some twitching on the rt side of my head and even my rt. Shoulder.

## 2015-02-18 ENCOUNTER — Encounter: Payer: Self-pay | Admitting: Family Medicine

## 2015-02-18 ENCOUNTER — Ambulatory Visit (INDEPENDENT_AMBULATORY_CARE_PROVIDER_SITE_OTHER): Payer: Medicare Other | Admitting: Family Medicine

## 2015-02-18 VITALS — BP 106/62 | HR 72 | Temp 98.4°F | Ht 64.0 in | Wt 155.2 lb

## 2015-02-18 DIAGNOSIS — G4700 Insomnia, unspecified: Secondary | ICD-10-CM

## 2015-02-18 NOTE — Progress Notes (Signed)
Pre visit review using our clinic review tool, if applicable. No additional management support is needed unless otherwise documented below in the visit note. 

## 2015-02-18 NOTE — Progress Notes (Signed)
Subjective:    Patient ID: [name], female    DOB: 1946-05-28, 69 y.o.   MRN: [id number]  HPI Here for side eff from temazepam   She had not taken it every night - intermittently for years - then started taking them every night for sleep (could not sleep) She stopped it (15 mg) and then the next day she had spasms/twitches in scalp in head  ED doctor gave her 7.5 mg to wean down (10 pills) - they are capsules   She is tired   She only takes 1/2 of citalopram  She would consider changing dose to pm instead of am  Gabapentin 300 bid - wonder it could increase to 600 at night   Depression and anxiety are under good control  Does water aerobics twice daily  Active lifestyle as well - walks in the house more  Feet are better since she lost weight   Fibromyalgia bothers her  Also back pain - seeing orthopedics - keeps her awake at night  She takes hydrocodone only if necessary- takes it rarely (tylenol instead)  Has deg disc and also hip bursitis      Review of Systems Review of Systems  Constitutional: Negative for fever, appetite change, fatigue and unexpected weight change.  Eyes: Negative for pain and visual disturbance.  Respiratory: Negative for cough and shortness of breath.   Cardiovascular: Negative for cp or palpitations    Gastrointestinal: Negative for nausea, diarrhea and constipation.  Genitourinary: Negative for urgency and frequency.  Skin: Negative for pallor or rash   MSK pos for chronic myofascial pain  Neurological: Negative for weakness, light-headedness, numbness and headaches.  Hematological: Negative for adenopathy. Does not bruise/bleed easily.  Psychiatric/Behavioral: Negative for dysphoric mood. The patient is not nervous/anxious.  pos for insomnia        Objective:   Physical Exam  Constitutional: She appears well-developed and well-nourished. No distress.  Well appearing   HENT:  Head: Normocephalic and atraumatic.  Mouth/Throat:  Oropharynx is clear and moist.  Eyes: Conjunctivae and EOM are normal. Pupils are equal, round, and reactive to light.  Neck: Normal range of motion. Neck supple. No JVD present. No thyromegaly present.  Cardiovascular: Normal rate, regular rhythm, normal heart sounds and intact distal pulses.  Exam reveals no gallop.   Pulmonary/Chest: Effort normal and breath sounds normal. No respiratory distress. She has no wheezes. She has no rales.  No crackles  Abdominal: Soft. Bowel sounds are normal. She exhibits no distension, no abdominal bruit and no mass. There is no tenderness.  Musculoskeletal: She exhibits no edema.  Some diffuse joint and muscle tenderness   Lymphadenopathy:    She has no cervical adenopathy.  Neurological: She is alert. She has normal reflexes. She displays no atrophy and no tremor. No cranial nerve deficit. She exhibits normal muscle tone. Coordination normal.  Skin: Skin is warm and dry. No rash noted.  Psychiatric: She has a normal mood and affect.          Assessment & Plan:   Problem List Items Addressed This Visit    Insomnia - Primary    With chronic pain and fibromyalgia Temazepam 15 was affecting memory but she had w/d symptoms when she stopped it cold (see ER note)_ rev in detail Will wean (now on 7.5)- take it every other day until gone -disc what to expect Move citalopram to pm  Consider inc gabapentin pm dose to 600 mg - she is not sure  she wants to do this yet  Disc sleep hygiene as well

## 2015-02-18 NOTE — Assessment & Plan Note (Signed)
With chronic pain and fibromyalgia Temazepam 15 was affecting memory but she had w/d symptoms when she stopped it cold (see ER note)_ rev in detail Will wean (now on 7.5)- take it every other day until gone -disc what to expect Move citalopram to pm  Consider inc gabapentin pm dose to 600 mg - she is not sure she wants to do this yet  Disc sleep hygiene as well

## 2015-02-18 NOTE — Patient Instructions (Signed)
Take your temazepam every other night until it is gone and then stay off of it  Change your citalopram to pm dosing  Stay off caffeine  Stay as active as you can  Think about the option of also increasing your gabapentin to 300 in the am and 600 in the pm- this would help sleep and back pain   (let me know in the future if you would like to do that)

## 2015-02-21 ENCOUNTER — Telehealth: Payer: Self-pay | Admitting: Family Medicine

## 2015-02-21 DIAGNOSIS — E785 Hyperlipidemia, unspecified: Secondary | ICD-10-CM

## 2015-02-21 DIAGNOSIS — I10 Essential (primary) hypertension: Secondary | ICD-10-CM

## 2015-02-21 NOTE — Telephone Encounter (Signed)
-----   Message from Marchia Bond sent at 02/20/2015  3:40 PM EDT ----- Regarding: cpx labs 8/15, need orders please :-) Please order  future cpx labs for pt's upcoming lab appt. Thanks Aniceto Boss

## 2015-02-23 ENCOUNTER — Other Ambulatory Visit (INDEPENDENT_AMBULATORY_CARE_PROVIDER_SITE_OTHER): Payer: Medicare Other

## 2015-02-23 DIAGNOSIS — I10 Essential (primary) hypertension: Secondary | ICD-10-CM

## 2015-02-23 DIAGNOSIS — E785 Hyperlipidemia, unspecified: Secondary | ICD-10-CM | POA: Diagnosis not present

## 2015-02-23 LAB — CBC WITH DIFFERENTIAL/PLATELET
Basophils Absolute: 0 10*3/uL (ref 0.0–0.1)
Basophils Relative: 0.3 % (ref 0.0–3.0)
Eosinophils Absolute: 0.1 10*3/uL (ref 0.0–0.7)
Eosinophils Relative: 1.6 % (ref 0.0–5.0)
HCT: 38.3 % (ref 36.0–46.0)
Hemoglobin: 12.9 g/dL (ref 12.0–15.0)
Lymphocytes Relative: 33.5 % (ref 12.0–46.0)
Lymphs Abs: 1.6 10*3/uL (ref 0.7–4.0)
MCHC: 33.6 g/dL (ref 30.0–36.0)
MCV: 92.9 fl (ref 78.0–100.0)
Monocytes Absolute: 0.3 10*3/uL (ref 0.1–1.0)
Monocytes Relative: 7.1 % (ref 3.0–12.0)
Neutro Abs: 2.7 10*3/uL (ref 1.4–7.7)
Neutrophils Relative %: 57.5 % (ref 43.0–77.0)
Platelets: 273 10*3/uL (ref 150.0–400.0)
RBC: 4.13 Mil/uL (ref 3.87–5.11)
RDW: 13.7 % (ref 11.5–15.5)
WBC: 4.7 10*3/uL (ref 4.0–10.5)

## 2015-02-23 LAB — COMPREHENSIVE METABOLIC PANEL
ALT: 13 U/L (ref 0–35)
AST: 17 U/L (ref 0–37)
Albumin: 3.8 g/dL (ref 3.5–5.2)
Alkaline Phosphatase: 52 U/L (ref 39–117)
BUN: 21 mg/dL (ref 6–23)
CO2: 30 mEq/L (ref 19–32)
Calcium: 9.5 mg/dL (ref 8.4–10.5)
Chloride: 103 mEq/L (ref 96–112)
Creatinine, Ser: 0.97 mg/dL (ref 0.40–1.20)
GFR: 60.34 mL/min (ref >60.00)
Glucose, Bld: 95 mg/dL (ref 70–99)
Potassium: 4.2 mEq/L (ref 3.5–5.1)
Sodium: 138 mEq/L (ref 135–145)
Total Bilirubin: 0.3 mg/dL (ref 0.2–1.2)
Total Protein: 6.2 g/dL (ref 6.0–8.3)

## 2015-02-23 LAB — LIPID PANEL
Cholesterol: 176 mg/dL (ref 0–200)
HDL: 67 mg/dL (ref >39.00)
LDL Cholesterol: 96 mg/dL (ref 0–99)
NonHDL: 108.65
Total CHOL/HDL Ratio: 3
Triglycerides: 62 mg/dL (ref 0.0–149.0)
VLDL: 12.4 mg/dL (ref 0.0–40.0)

## 2015-02-23 LAB — TSH: TSH: 0.93 u[IU]/mL (ref 0.35–4.50)

## 2015-02-25 ENCOUNTER — Ambulatory Visit (INDEPENDENT_AMBULATORY_CARE_PROVIDER_SITE_OTHER): Payer: Medicare Other | Admitting: Family Medicine

## 2015-02-25 ENCOUNTER — Encounter: Payer: Self-pay | Admitting: Family Medicine

## 2015-02-25 VITALS — BP 110/62 | HR 64 | Temp 98.4°F | Ht 61.5 in | Wt 154.8 lb

## 2015-02-25 DIAGNOSIS — I10 Essential (primary) hypertension: Secondary | ICD-10-CM

## 2015-02-25 DIAGNOSIS — Z23 Encounter for immunization: Secondary | ICD-10-CM

## 2015-02-25 DIAGNOSIS — E785 Hyperlipidemia, unspecified: Secondary | ICD-10-CM | POA: Diagnosis not present

## 2015-02-25 DIAGNOSIS — Z0000 Encounter for general adult medical examination without abnormal findings: Secondary | ICD-10-CM

## 2015-02-25 NOTE — Progress Notes (Signed)
Pre visit review using our clinic review tool, if applicable. No additional management support is needed unless otherwise documented below in the visit note. 

## 2015-02-25 NOTE — Progress Notes (Signed)
Subjective:    Patient ID: [name], female    DOB: May 17, 1946, 69 y.o.   MRN: [id number]  HPI Here for annual medicare wellness visit as well as chronic/acute medical problems   Has been doing well overall  Is off of the temazepam completely - she went off of it quicker than she thought she could  Does not have the shakes [place] of herself and mood is fine  Still on the citalopram   I have personally reviewed the Medicare Annual Wellness questionnaire and have noted 1. The patient's medical and social history 2. Their use of alcohol, tobacco or illicit drugs 3. Their current medications and supplements 4. The patient's functional ability including ADL's, fall risks, home safety risks and hearing or visual             impairment. 5. Diet and physical activities 6. Evidence for depression or mood disorders  The patients weight, height, BMI have been recorded in the chart and visual acuity is per eye clinic.  I have made referrals, counseling and provided education to the patient based review of the above and I have provided the pt with a written personalized care plan for preventive services. Reviewed and updated provider list, see scanned forms.  See scanned forms.  Routine anticipatory guidance given to patient.  See health maintenance. Colon cancer screening- 3/09 colonoscopy normal - up to date (no family history) Breast cancer screening mammogram 7/16 - normal (at [facility])- pt will send for a copy Self breast -no lumps or changes  Flu vaccine- allergic/hives, cannot have it  Tetanus vaccine  11/09 Pneumovax 9/13 - will get prevnar today  Zoster vaccine 11/09 Hep C screen - declines /not high risk  Advance directive has a living will and POA done already  Cognitive function addressed- see scanned forms- and if abnormal then additional documentation follows.  Had problems when she took temazepam- better now  occ problems with names , no more than that  Her mother had  dementia- she knows what to watch for  She does all of the banking and balances checkbook   PMH and SH reviewed  Meds, vitals, and allergies reviewed.   ROS: See HPI.  Otherwise negative.     Wt is down 1 lb with bmi of 28 Down 22 lb so far total - since march - going to [employer] (enjoys the meetings also)  bp is stable today  No cp or palpitations or headaches or edema  No side effects to medicines  BP Readings from Last 3 Encounters:  02/25/15 110/62  02/18/15 106/62  02/15/15 131/61     Hx of hyperlipidemia  Lab Results  Component Value Date   CHOL 176 02/23/2015   CHOL 178 12/09/2014   CHOL 188 05/14/2014   Lab Results  Component Value Date   HDL 67.00 02/23/2015   HDL 60 12/09/2014   HDL 74.60 05/14/2014   Lab Results  Component Value Date   LDLCALC 96 02/23/2015   LDLCALC 104* 12/09/2014   LDLCALC 100* 05/14/2014   Lab Results  Component Value Date   TRIG 62.0 02/23/2015   TRIG 69 12/09/2014   TRIG 66.0 05/14/2014   Lab Results  Component Value Date   CHOLHDL 3 02/23/2015   CHOLHDL 3.0 12/09/2014   CHOLHDL 3 05/14/2014   No results found for: LDLDIRECT   On simvastatin and diet  Doing well - really good with diet for weight watchers   Patient Active Problem List  Diagnosis Date Noted  . Encounter for Medicare annual wellness exam 02/25/2015  . Insomnia 02/18/2015  . Precordial chest pain 01/12/2015  . Fatigue   . Diastolic dysfunction   . Ejection fraction   . Routine health maintenance 03/26/2011  . Carotid artery disease   . Drug therapy   . PEDAL EDEMA 03/17/2010  . INSOMNIA, CHRONIC 04/17/2009  . OSTEOARTHROSIS UNSPEC WHETHER GEN/LOC ANK&FOOT 01/05/2009  . Depression 05/22/2008  . TINNITUS 08/30/2007  . Hyperlipidemia 04/05/2007  . PERIPHERAL NEUROPATHY 04/05/2007  . Essential hypertension 04/05/2007  . GASTROESOPHAGEAL REFLUX DISEASE 04/05/2007  . DEGENERATIVE JOINT DISEASE, CERVICAL SPINE 04/05/2007  . [place] DISEASE,  LUMBAR 04/05/2007  . Fibromyalgia 04/05/2007  . MIGRAINES, HX OF 04/05/2007  . APPENDECTOMY, HX OF 04/05/2007   Past Medical History  Diagnosis Date  . Peripheral neuropathy   . Fibromyalgia   . Hyperlipemia   . Degenerative joint disease     cervical spine  . GERD (gastroesophageal reflux disease)   . Lumbar disc disease   . Hypertension   . Depression   . Carotid artery disease     Doppler, April, 2012,  6-06% R. ICA, 30-16% LICA... no significant change... frequency of Doppler moved to yearly because of stability  . Chest pain     Nuclear stress test 2006 normal  . Hx of colonoscopy   . Drug therapy     msucle aches with simvastatin, but tolerates 5mg  Creastor  . Migraines   . Small fiber neuropathy   . Obesity   . Ejection fraction     EF 60%, echo, December 20, 2011  . Fatigue     June, 2013  . Diastolic dysfunction     Echo report, June, 2013  . Diverticulosis of colon (without mention of hemorrhage) 10/03/2007    Colonoscopy  . Internal hemorrhoids without mention of complication 0/04/9322    Colonoscopy  . Reflux esophagitis 04/21/00    EGD  . IBS (irritable bowel syndrome)   . Sleep apnea   . Pancreatitis    Past Surgical History  Procedure Laterality Date  . Laparoscopic ovarian cystectomy  1960's  . Appendectomy  1961  . Shoulder surgery  2004    "frozen"; right  . Trigger finger release  1996    right thumb   Social History  Substance Use Topics  . Smoking status: Former Smoker -- 1.00 packs/day for 15 years    Types: Cigarettes    Quit date: 07/11/1996  . Smokeless tobacco: Never Used  . Alcohol Use: No   Family History  Problem Relation Age of Onset  . Coronary artery disease Father   . Heart attack Father   . Heart disease Father     CAD/MI x 3, last fatal  . Diabetes Neg Hx   . Colon cancer Neg Hx   . Breast cancer Paternal Aunt     x 2  . Stroke Mother   . Dementia Mother   . Heart disease Mother     chf  . Obesity Brother   .  Hypertension Brother    Allergies  Allergen Reactions  . Codeine Nausea And Vomiting  . Mobic [Meloxicam] Other (See Comments)    REACTION: Chest pain   . Nitrofuran Derivatives Diarrhea  . Temazepam Other (See Comments)    Memory loss   . Celebrex [Celecoxib] Rash  . Influenza Vac Split [Flu Virus Vaccine] Rash    Rash on face and body   Current Outpatient Prescriptions on File  Prior to Visit  Medication Sig Dispense Refill  . acetaminophen (TYLENOL) 500 MG tablet Take 1,000 mg by mouth every 8 (eight) hours as needed for mild pain or moderate pain.    Marland Kitchen aspirin 81 MG tablet Take 81 mg by mouth daily.     . citalopram (CELEXA) 20 MG tablet Take 1/2  tablet by mouth  daily (Patient taking differently: Take 10 mg by mouth daily. Take 1/2  tablet by mouth  daily) 45 tablet 3  . Coenzyme Q10 (CO Q 10 PO) Take 10 mLs by mouth daily.    Marland Kitchen gabapentin (NEURONTIN) 300 MG capsule Take 1 capsule by mouth  twice daily 180 capsule 3  . hydrochlorothiazide (HYDRODIURIL) 25 MG tablet Take 1 tablet (25 mg total) by mouth daily. 90 tablet 3  . HYDROcodone-acetaminophen (NORCO) 10-325 MG per tablet Take 1 tablet by mouth every 6 (six) hours as needed. For pain 60 tablet 2  . Liniments (SALONPAS ARTHRITIS PAIN RELIEF EX) Apply 0.5 patches topically daily as needed (Pain).    Marland Kitchen losartan (COZAAR) 100 MG tablet Take 1 tablet (100 mg total) by mouth daily. 90 tablet 3  . methocarbamol (ROBAXIN) 500 MG tablet Take 500 mg by mouth 4 (four) times daily as needed for muscle spasms.    . Multiple Vitamin (MULTIVITAMIN WITH MINERALS) TABS Take 1 tablet by mouth daily.    Marland Kitchen omeprazole (PRILOSEC) 40 MG capsule Take 1 capsule (40 mg total) by mouth daily. 90 capsule 0  . simvastatin (ZOCOR) 20 MG tablet Take one tablet by mouth in the evenings three times weekly (Mon, Wed, Fri). 36 tablet 3  . [DISCONTINUED] diltiazem (DILACOR XR) 180 MG 24 hr capsule Take 1 capsule (180 mg total) by mouth daily. 30 capsule 1   No  current facility-administered medications on file prior to visit.     Review of Systems Review of Systems  Constitutional: Negative for fever, appetite change, fatigue and unexpected weight change.  Eyes: Negative for pain and visual disturbance.  Respiratory: Negative for cough and shortness of breath.   Cardiovascular: Negative for cp or palpitations    Gastrointestinal: Negative for nausea, diarrhea and constipation.  Genitourinary: Negative for urgency and frequency.  Skin: Negative for pallor or rash   Neurological: Negative for weakness, light-headedness, numbness and headaches.  Hematological: Negative for adenopathy. Does not bruise/bleed easily.  Psychiatric/Behavioral: Negative for dysphoric mood. The patient is not nervous/anxious.         Objective:   Physical Exam  Constitutional: She appears well-developed and well-nourished. No distress.  HENT:  Head: Normocephalic and atraumatic.  Right Ear: External ear normal.  Left Ear: External ear normal.  Nose: Nose normal.  Mouth/Throat: Oropharynx is clear and moist.  Eyes: Conjunctivae and EOM are normal. Pupils are equal, round, and reactive to light. Right eye exhibits no discharge. Left eye exhibits no discharge. No scleral icterus.  Neck: Normal range of motion. Neck supple. No JVD present. Carotid bruit is not present. No thyromegaly present.  Cardiovascular: Normal rate, regular rhythm, normal heart sounds and intact distal pulses.  Exam reveals no gallop.   Pulmonary/Chest: Effort normal and breath sounds normal. No respiratory distress. She has no wheezes. She has no rales.  Abdominal: Soft. Bowel sounds are normal. She exhibits no distension and no mass. There is no tenderness.  Genitourinary: No breast swelling, tenderness, discharge or bleeding.  Breast exam: No mass, nodules, thickening, tenderness, bulging, retraction, inflamation, nipple discharge or skin changes noted.  No axillary  or clavicular LA.        Musculoskeletal: She exhibits no edema or tenderness.  Lymphadenopathy:    She has no cervical adenopathy.  Neurological: She is alert. She has normal reflexes. No cranial nerve deficit. She exhibits normal muscle tone. Coordination normal.  Skin: Skin is warm and dry. No rash noted. No erythema. No pallor.  Psychiatric: She has a normal mood and affect.          Assessment & Plan:   Problem List Items Addressed This Visit    Encounter for Medicare annual wellness exam - Primary    Reviewed health habits including diet and exercise and skin cancer prevention Reviewed appropriate screening tests for age  Also reviewed health mt list, fam hx and immunization status , as well as social and family history   See HPI Labs reviewed prevnar vaccine today  Your 2 year mark for bone density test is 10/16 - let us know any time after that if you want to schedule that       Essential hypertension    bp in fair control at this time  BP Readings from Last 1 Encounters:  02/25/15 110/62   No changes needed Disc lifstyle change with low sodium diet and exercise  Labs reviewed       Hyperlipidemia    Well controlled with simvastatin and diet  Disc goals for lipids and reasons to control them Rev labs with pt Rev low sat fat diet in detail        Other Visit Diagnoses    Need for prophylactic vaccination against Streptococcus pneumoniae (pneumococcus)        Relevant Orders    Pneumococcal conjugate vaccine 13-valent IM (Completed)

## 2015-02-25 NOTE — Patient Instructions (Signed)
prevnar vaccine today  Labs look stable  I'm glad you are feeling well  Your 2 year mark for bone density test is 10/16 - let us know any time after that if you want to schedule that

## 2015-02-26 DIAGNOSIS — M5441 Lumbago with sciatica, right side: Secondary | ICD-10-CM | POA: Diagnosis not present

## 2015-02-26 DIAGNOSIS — M9903 Segmental and somatic dysfunction of lumbar region: Secondary | ICD-10-CM | POA: Diagnosis not present

## 2015-02-26 NOTE — Assessment & Plan Note (Signed)
bp in fair control at this time  BP Readings from Last 1 Encounters:  02/25/15 110/62   No changes needed Disc lifstyle change with low sodium diet and exercise  Labs reviewed

## 2015-02-26 NOTE — Assessment & Plan Note (Signed)
Well controlled with simvastatin and diet  Disc goals for lipids and reasons to control them Rev labs with pt Rev low sat fat diet in detail

## 2015-02-26 NOTE — Assessment & Plan Note (Signed)
Reviewed health habits including diet and exercise and skin cancer prevention Reviewed appropriate screening tests for age  Also reviewed health mt list, fam hx and immunization status , as well as social and family history   See HPI Labs reviewed prevnar vaccine today  Your 2 year mark for bone density test is 10/16 - let us know any time after that if you want to schedule that

## 2015-03-19 DIAGNOSIS — Z961 Presence of intraocular lens: Secondary | ICD-10-CM | POA: Diagnosis not present

## 2015-04-01 ENCOUNTER — Other Ambulatory Visit: Payer: Medicare Other

## 2015-04-06 ENCOUNTER — Encounter: Payer: Medicare Other | Admitting: Family Medicine

## 2015-04-13 DIAGNOSIS — M7061 Trochanteric bursitis, right hip: Secondary | ICD-10-CM | POA: Diagnosis not present

## 2015-04-13 DIAGNOSIS — M5441 Lumbago with sciatica, right side: Secondary | ICD-10-CM | POA: Diagnosis not present

## 2015-04-15 ENCOUNTER — Other Ambulatory Visit: Payer: Self-pay | Admitting: Orthopedic Surgery

## 2015-04-15 DIAGNOSIS — M7061 Trochanteric bursitis, right hip: Secondary | ICD-10-CM

## 2015-04-21 DIAGNOSIS — F329 Major depressive disorder, single episode, unspecified: Secondary | ICD-10-CM | POA: Diagnosis not present

## 2015-04-21 DIAGNOSIS — K219 Gastro-esophageal reflux disease without esophagitis: Secondary | ICD-10-CM | POA: Diagnosis not present

## 2015-04-21 DIAGNOSIS — M5431 Sciatica, right side: Secondary | ICD-10-CM | POA: Diagnosis not present

## 2015-04-21 DIAGNOSIS — M5136 Other intervertebral disc degeneration, lumbar region: Secondary | ICD-10-CM | POA: Diagnosis not present

## 2015-04-21 DIAGNOSIS — G603 Idiopathic progressive neuropathy: Secondary | ICD-10-CM | POA: Diagnosis not present

## 2015-04-21 DIAGNOSIS — E785 Hyperlipidemia, unspecified: Secondary | ICD-10-CM | POA: Diagnosis not present

## 2015-04-21 DIAGNOSIS — I10 Essential (primary) hypertension: Secondary | ICD-10-CM | POA: Diagnosis not present

## 2015-04-22 ENCOUNTER — Telehealth: Payer: Self-pay | Admitting: Cardiology

## 2015-04-22 NOTE — Telephone Encounter (Signed)
Pt states that she seen Dr. Inda Merlin, PCP, yesterday and she informed him of medication changes Dr. Ron Parker made at her last appt with him (D/C Benicar/HCTZ and diltiazem when current supply runs out, then start Losartan 100mg  QD and HCTZ 25mg  QD). Dr. Inda Merlin was unsure that Dr. Ron Parker had actually wanted pt to stop her Diltiazem and asked that she contact our office to make sure this was correct. Informed pt that per Dr. Wynelle Beckmann note he did state for pt to stop the Diltiazem. Pt verbalized understanding and was in agreement with this plan.

## 2015-04-22 NOTE — Telephone Encounter (Signed)
New problem    Pt need to speak to nurse concerning how she need to take her medications. Please call pt.

## 2015-04-24 ENCOUNTER — Other Ambulatory Visit: Payer: Medicare Other

## 2015-04-27 ENCOUNTER — Other Ambulatory Visit: Payer: Self-pay | Admitting: Orthopedic Surgery

## 2015-04-27 ENCOUNTER — Inpatient Hospital Stay
Admission: RE | Admit: 2015-04-27 | Discharge: 2015-04-27 | Disposition: A | Discharge status: Home / Self Care | Payer: Self-pay | Source: Ambulatory Visit | Attending: Orthopedic Surgery | Admitting: Orthopedic Surgery

## 2015-04-27 ENCOUNTER — Ambulatory Visit
Admission: RE | Admit: 2015-04-27 | Discharge: 2015-04-27 | Disposition: A | Discharge status: Home / Self Care | Payer: Medicare Other | Source: Ambulatory Visit | Attending: Orthopedic Surgery | Admitting: Orthopedic Surgery

## 2015-04-27 DIAGNOSIS — M4806 Spinal stenosis, lumbar region: Secondary | ICD-10-CM | POA: Diagnosis not present

## 2015-04-27 DIAGNOSIS — M5126 Other intervertebral disc displacement, lumbar region: Secondary | ICD-10-CM | POA: Diagnosis not present

## 2015-04-27 DIAGNOSIS — R52 Pain, unspecified: Secondary | ICD-10-CM

## 2015-04-27 DIAGNOSIS — M7061 Trochanteric bursitis, right hip: Secondary | ICD-10-CM

## 2015-04-27 MED ORDER — IOHEXOL 180 MG/ML  SOLN
18.0000 mL | Freq: Once | INTRAMUSCULAR | Status: DC | PRN
  Administered 2015-04-27: 18 mL via INTRATHECAL

## 2015-04-27 NOTE — Discharge Instructions (Signed)
Myelogram Discharge Instructions  1. Go home and rest quietly for the next 24 hours.  It is important to lie flat for the next 24 hours.  Get up only to go to the restroom.  You may lie in the bed or on a couch on your back, your stomach, your left side or your right side.  You may have one pillow under your head.  You may have pillows between your knees while you are on your side or under your knees while you are on your back.  2. DO NOT drive today.  Recline the seat as far back as it will go, while still wearing your seat belt, on the way home.  3. You may get up to go to the bathroom as needed.  You may sit up for 10 minutes to eat.  You may resume your normal diet and medications unless otherwise indicated.  Drink lots of extra fluids today and tomorrow.  4. The incidence of headache, nausea, or vomiting is about 5% (one in 20 patients).  If you develop a headache, lie flat and drink plenty of fluids until the headache goes away.  Caffeinated beverages may be helpful.  If you develop severe nausea and vomiting or a headache that does not go away with flat bed rest, call [phone].  5. You may resume normal activities after your 24 hours of bed rest is over; however, do not exert yourself strongly or do any heavy lifting tomorrow. If when you get up you have a headache when standing, go back to bed and force fluids for another 24 hours.  6. Call your physician for a follow-up appointment.  The results of your myelogram will be sent directly to your physician by the following day.  7. If you have any questions or if complications develop after you arrive home, please call [phone].  Discharge instructions have been explained to the patient.  The patient, or the person responsible for the patient, fully understands these instructions.       May resume Citalopram on Oct. 18, 2016, after 9:30 am.

## 2015-04-27 NOTE — Progress Notes (Signed)
Pt states she has been off Citalopram for the past 2 days.  Discharge instructions explained to pt.

## 2015-05-01 DIAGNOSIS — M5441 Lumbago with sciatica, right side: Secondary | ICD-10-CM | POA: Diagnosis not present

## 2015-05-01 DIAGNOSIS — G8929 Other chronic pain: Secondary | ICD-10-CM | POA: Diagnosis not present

## 2015-05-01 DIAGNOSIS — M545 Low back pain: Secondary | ICD-10-CM | POA: Diagnosis not present

## 2015-05-27 DIAGNOSIS — M8588 Other specified disorders of bone density and structure, other site: Secondary | ICD-10-CM | POA: Diagnosis not present

## 2015-06-02 DIAGNOSIS — M5136 Other intervertebral disc degeneration, lumbar region: Secondary | ICD-10-CM | POA: Diagnosis not present

## 2015-06-02 DIAGNOSIS — M545 Low back pain: Secondary | ICD-10-CM | POA: Diagnosis not present

## 2015-06-02 DIAGNOSIS — M546 Pain in thoracic spine: Secondary | ICD-10-CM | POA: Diagnosis not present

## 2015-06-02 DIAGNOSIS — M5030 Other cervical disc degeneration, unspecified cervical region: Secondary | ICD-10-CM | POA: Diagnosis not present

## 2015-06-02 DIAGNOSIS — M9903 Segmental and somatic dysfunction of lumbar region: Secondary | ICD-10-CM | POA: Diagnosis not present

## 2015-06-02 DIAGNOSIS — M542 Cervicalgia: Secondary | ICD-10-CM | POA: Diagnosis not present

## 2015-06-02 DIAGNOSIS — M9901 Segmental and somatic dysfunction of cervical region: Secondary | ICD-10-CM | POA: Diagnosis not present

## 2015-06-02 DIAGNOSIS — M5134 Other intervertebral disc degeneration, thoracic region: Secondary | ICD-10-CM | POA: Diagnosis not present

## 2015-06-02 DIAGNOSIS — M9902 Segmental and somatic dysfunction of thoracic region: Secondary | ICD-10-CM | POA: Diagnosis not present

## 2015-06-09 DIAGNOSIS — M542 Cervicalgia: Secondary | ICD-10-CM | POA: Diagnosis not present

## 2015-06-09 DIAGNOSIS — M5136 Other intervertebral disc degeneration, lumbar region: Secondary | ICD-10-CM | POA: Diagnosis not present

## 2015-06-09 DIAGNOSIS — M9903 Segmental and somatic dysfunction of lumbar region: Secondary | ICD-10-CM | POA: Diagnosis not present

## 2015-06-09 DIAGNOSIS — M5134 Other intervertebral disc degeneration, thoracic region: Secondary | ICD-10-CM | POA: Diagnosis not present

## 2015-06-09 DIAGNOSIS — M9901 Segmental and somatic dysfunction of cervical region: Secondary | ICD-10-CM | POA: Diagnosis not present

## 2015-06-09 DIAGNOSIS — M546 Pain in thoracic spine: Secondary | ICD-10-CM | POA: Diagnosis not present

## 2015-06-09 DIAGNOSIS — M5030 Other cervical disc degeneration, unspecified cervical region: Secondary | ICD-10-CM | POA: Diagnosis not present

## 2015-06-09 DIAGNOSIS — M9902 Segmental and somatic dysfunction of thoracic region: Secondary | ICD-10-CM | POA: Diagnosis not present

## 2015-06-09 DIAGNOSIS — M545 Low back pain: Secondary | ICD-10-CM | POA: Diagnosis not present

## 2015-06-15 DIAGNOSIS — M542 Cervicalgia: Secondary | ICD-10-CM | POA: Diagnosis not present

## 2015-06-15 DIAGNOSIS — M9902 Segmental and somatic dysfunction of thoracic region: Secondary | ICD-10-CM | POA: Diagnosis not present

## 2015-06-15 DIAGNOSIS — M545 Low back pain: Secondary | ICD-10-CM | POA: Diagnosis not present

## 2015-06-15 DIAGNOSIS — M546 Pain in thoracic spine: Secondary | ICD-10-CM | POA: Diagnosis not present

## 2015-06-15 DIAGNOSIS — M9903 Segmental and somatic dysfunction of lumbar region: Secondary | ICD-10-CM | POA: Diagnosis not present

## 2015-06-15 DIAGNOSIS — M5030 Other cervical disc degeneration, unspecified cervical region: Secondary | ICD-10-CM | POA: Diagnosis not present

## 2015-06-15 DIAGNOSIS — M9901 Segmental and somatic dysfunction of cervical region: Secondary | ICD-10-CM | POA: Diagnosis not present

## 2015-06-15 DIAGNOSIS — M5134 Other intervertebral disc degeneration, thoracic region: Secondary | ICD-10-CM | POA: Diagnosis not present

## 2015-06-15 DIAGNOSIS — M5136 Other intervertebral disc degeneration, lumbar region: Secondary | ICD-10-CM | POA: Diagnosis not present

## 2015-06-17 DIAGNOSIS — M9902 Segmental and somatic dysfunction of thoracic region: Secondary | ICD-10-CM | POA: Diagnosis not present

## 2015-06-17 DIAGNOSIS — M9903 Segmental and somatic dysfunction of lumbar region: Secondary | ICD-10-CM | POA: Diagnosis not present

## 2015-06-17 DIAGNOSIS — M545 Low back pain: Secondary | ICD-10-CM | POA: Diagnosis not present

## 2015-06-17 DIAGNOSIS — M5134 Other intervertebral disc degeneration, thoracic region: Secondary | ICD-10-CM | POA: Diagnosis not present

## 2015-06-17 DIAGNOSIS — M9901 Segmental and somatic dysfunction of cervical region: Secondary | ICD-10-CM | POA: Diagnosis not present

## 2015-06-17 DIAGNOSIS — M542 Cervicalgia: Secondary | ICD-10-CM | POA: Diagnosis not present

## 2015-06-17 DIAGNOSIS — M5030 Other cervical disc degeneration, unspecified cervical region: Secondary | ICD-10-CM | POA: Diagnosis not present

## 2015-06-17 DIAGNOSIS — M546 Pain in thoracic spine: Secondary | ICD-10-CM | POA: Diagnosis not present

## 2015-06-17 DIAGNOSIS — M5136 Other intervertebral disc degeneration, lumbar region: Secondary | ICD-10-CM | POA: Diagnosis not present

## 2015-06-19 DIAGNOSIS — M542 Cervicalgia: Secondary | ICD-10-CM | POA: Diagnosis not present

## 2015-06-19 DIAGNOSIS — M546 Pain in thoracic spine: Secondary | ICD-10-CM | POA: Diagnosis not present

## 2015-06-19 DIAGNOSIS — M5136 Other intervertebral disc degeneration, lumbar region: Secondary | ICD-10-CM | POA: Diagnosis not present

## 2015-06-19 DIAGNOSIS — M545 Low back pain: Secondary | ICD-10-CM | POA: Diagnosis not present

## 2015-06-19 DIAGNOSIS — M9902 Segmental and somatic dysfunction of thoracic region: Secondary | ICD-10-CM | POA: Diagnosis not present

## 2015-06-19 DIAGNOSIS — M9903 Segmental and somatic dysfunction of lumbar region: Secondary | ICD-10-CM | POA: Diagnosis not present

## 2015-06-19 DIAGNOSIS — M9901 Segmental and somatic dysfunction of cervical region: Secondary | ICD-10-CM | POA: Diagnosis not present

## 2015-06-19 DIAGNOSIS — M5134 Other intervertebral disc degeneration, thoracic region: Secondary | ICD-10-CM | POA: Diagnosis not present

## 2015-06-19 DIAGNOSIS — M5030 Other cervical disc degeneration, unspecified cervical region: Secondary | ICD-10-CM | POA: Diagnosis not present

## 2015-06-22 DIAGNOSIS — M542 Cervicalgia: Secondary | ICD-10-CM | POA: Diagnosis not present

## 2015-06-22 DIAGNOSIS — M9901 Segmental and somatic dysfunction of cervical region: Secondary | ICD-10-CM | POA: Diagnosis not present

## 2015-06-22 DIAGNOSIS — M5136 Other intervertebral disc degeneration, lumbar region: Secondary | ICD-10-CM | POA: Diagnosis not present

## 2015-06-22 DIAGNOSIS — M546 Pain in thoracic spine: Secondary | ICD-10-CM | POA: Diagnosis not present

## 2015-06-22 DIAGNOSIS — M545 Low back pain: Secondary | ICD-10-CM | POA: Diagnosis not present

## 2015-06-22 DIAGNOSIS — M9903 Segmental and somatic dysfunction of lumbar region: Secondary | ICD-10-CM | POA: Diagnosis not present

## 2015-06-22 DIAGNOSIS — M9902 Segmental and somatic dysfunction of thoracic region: Secondary | ICD-10-CM | POA: Diagnosis not present

## 2015-06-22 DIAGNOSIS — M5134 Other intervertebral disc degeneration, thoracic region: Secondary | ICD-10-CM | POA: Diagnosis not present

## 2015-06-22 DIAGNOSIS — M5030 Other cervical disc degeneration, unspecified cervical region: Secondary | ICD-10-CM | POA: Diagnosis not present

## 2015-06-29 DIAGNOSIS — M542 Cervicalgia: Secondary | ICD-10-CM | POA: Diagnosis not present

## 2015-06-29 DIAGNOSIS — M545 Low back pain: Secondary | ICD-10-CM | POA: Diagnosis not present

## 2015-06-29 DIAGNOSIS — M5030 Other cervical disc degeneration, unspecified cervical region: Secondary | ICD-10-CM | POA: Diagnosis not present

## 2015-06-29 DIAGNOSIS — M5136 Other intervertebral disc degeneration, lumbar region: Secondary | ICD-10-CM | POA: Diagnosis not present

## 2015-06-29 DIAGNOSIS — M546 Pain in thoracic spine: Secondary | ICD-10-CM | POA: Diagnosis not present

## 2015-06-29 DIAGNOSIS — M5134 Other intervertebral disc degeneration, thoracic region: Secondary | ICD-10-CM | POA: Diagnosis not present

## 2015-06-29 DIAGNOSIS — M9902 Segmental and somatic dysfunction of thoracic region: Secondary | ICD-10-CM | POA: Diagnosis not present

## 2015-06-29 DIAGNOSIS — M9901 Segmental and somatic dysfunction of cervical region: Secondary | ICD-10-CM | POA: Diagnosis not present

## 2015-06-29 DIAGNOSIS — M9903 Segmental and somatic dysfunction of lumbar region: Secondary | ICD-10-CM | POA: Diagnosis not present

## 2015-07-01 DIAGNOSIS — M5030 Other cervical disc degeneration, unspecified cervical region: Secondary | ICD-10-CM | POA: Diagnosis not present

## 2015-07-01 DIAGNOSIS — M5136 Other intervertebral disc degeneration, lumbar region: Secondary | ICD-10-CM | POA: Diagnosis not present

## 2015-07-01 DIAGNOSIS — M546 Pain in thoracic spine: Secondary | ICD-10-CM | POA: Diagnosis not present

## 2015-07-01 DIAGNOSIS — M9903 Segmental and somatic dysfunction of lumbar region: Secondary | ICD-10-CM | POA: Diagnosis not present

## 2015-07-01 DIAGNOSIS — M545 Low back pain: Secondary | ICD-10-CM | POA: Diagnosis not present

## 2015-07-01 DIAGNOSIS — M542 Cervicalgia: Secondary | ICD-10-CM | POA: Diagnosis not present

## 2015-07-01 DIAGNOSIS — M5134 Other intervertebral disc degeneration, thoracic region: Secondary | ICD-10-CM | POA: Diagnosis not present

## 2015-07-01 DIAGNOSIS — M9901 Segmental and somatic dysfunction of cervical region: Secondary | ICD-10-CM | POA: Diagnosis not present

## 2015-07-01 DIAGNOSIS — M9902 Segmental and somatic dysfunction of thoracic region: Secondary | ICD-10-CM | POA: Diagnosis not present

## 2015-07-03 DIAGNOSIS — M5134 Other intervertebral disc degeneration, thoracic region: Secondary | ICD-10-CM | POA: Diagnosis not present

## 2015-07-03 DIAGNOSIS — M5136 Other intervertebral disc degeneration, lumbar region: Secondary | ICD-10-CM | POA: Diagnosis not present

## 2015-07-03 DIAGNOSIS — M546 Pain in thoracic spine: Secondary | ICD-10-CM | POA: Diagnosis not present

## 2015-07-03 DIAGNOSIS — M9903 Segmental and somatic dysfunction of lumbar region: Secondary | ICD-10-CM | POA: Diagnosis not present

## 2015-07-03 DIAGNOSIS — M9902 Segmental and somatic dysfunction of thoracic region: Secondary | ICD-10-CM | POA: Diagnosis not present

## 2015-07-03 DIAGNOSIS — M542 Cervicalgia: Secondary | ICD-10-CM | POA: Diagnosis not present

## 2015-07-03 DIAGNOSIS — M545 Low back pain: Secondary | ICD-10-CM | POA: Diagnosis not present

## 2015-07-03 DIAGNOSIS — M9901 Segmental and somatic dysfunction of cervical region: Secondary | ICD-10-CM | POA: Diagnosis not present

## 2015-07-03 DIAGNOSIS — M5030 Other cervical disc degeneration, unspecified cervical region: Secondary | ICD-10-CM | POA: Diagnosis not present

## 2015-07-08 DIAGNOSIS — M546 Pain in thoracic spine: Secondary | ICD-10-CM | POA: Diagnosis not present

## 2015-07-08 DIAGNOSIS — M542 Cervicalgia: Secondary | ICD-10-CM | POA: Diagnosis not present

## 2015-07-08 DIAGNOSIS — M5030 Other cervical disc degeneration, unspecified cervical region: Secondary | ICD-10-CM | POA: Diagnosis not present

## 2015-07-08 DIAGNOSIS — M5134 Other intervertebral disc degeneration, thoracic region: Secondary | ICD-10-CM | POA: Diagnosis not present

## 2015-07-08 DIAGNOSIS — M9901 Segmental and somatic dysfunction of cervical region: Secondary | ICD-10-CM | POA: Diagnosis not present

## 2015-07-08 DIAGNOSIS — M5136 Other intervertebral disc degeneration, lumbar region: Secondary | ICD-10-CM | POA: Diagnosis not present

## 2015-07-08 DIAGNOSIS — M9902 Segmental and somatic dysfunction of thoracic region: Secondary | ICD-10-CM | POA: Diagnosis not present

## 2015-07-08 DIAGNOSIS — M9903 Segmental and somatic dysfunction of lumbar region: Secondary | ICD-10-CM | POA: Diagnosis not present

## 2015-07-08 DIAGNOSIS — M545 Low back pain: Secondary | ICD-10-CM | POA: Diagnosis not present

## 2015-07-20 ENCOUNTER — Telehealth: Payer: Self-pay | Admitting: Gastroenterology

## 2015-07-20 NOTE — Telephone Encounter (Signed)
Pt states she has been having abdominal pain in her upper abdomen along with bloating and acid reflux along with belching. States her stool is a yellow color and she has been having BRB from her rectum. Pt wonders if this is related to her gallbladder and pt requests to be seen. Pt scheduled to see Lori Hvozdovic, PA-C 07/23/15@1 :15pm. Pt aware of appt.

## 2015-07-23 ENCOUNTER — Encounter: Payer: Self-pay | Admitting: Physician Assistant

## 2015-07-23 ENCOUNTER — Other Ambulatory Visit (INDEPENDENT_AMBULATORY_CARE_PROVIDER_SITE_OTHER): Payer: Medicare Other

## 2015-07-23 ENCOUNTER — Ambulatory Visit (INDEPENDENT_AMBULATORY_CARE_PROVIDER_SITE_OTHER): Payer: Medicare Other | Admitting: Physician Assistant

## 2015-07-23 ENCOUNTER — Encounter: Payer: Self-pay | Admitting: *Deleted

## 2015-07-23 VITALS — BP 104/60 | HR 76 | Ht 61.25 in

## 2015-07-23 DIAGNOSIS — R1011 Right upper quadrant pain: Secondary | ICD-10-CM

## 2015-07-23 DIAGNOSIS — K644 Residual hemorrhoidal skin tags: Secondary | ICD-10-CM

## 2015-07-23 DIAGNOSIS — R1013 Epigastric pain: Secondary | ICD-10-CM

## 2015-07-23 DIAGNOSIS — K219 Gastro-esophageal reflux disease without esophagitis: Secondary | ICD-10-CM | POA: Diagnosis not present

## 2015-07-23 DIAGNOSIS — K648 Other hemorrhoids: Secondary | ICD-10-CM | POA: Diagnosis not present

## 2015-07-23 LAB — CBC WITH DIFFERENTIAL/PLATELET
Basophils Absolute: 0 10*3/uL (ref 0.0–0.1)
Basophils Relative: 0.2 % (ref 0.0–3.0)
Eosinophils Absolute: 0.1 10*3/uL (ref 0.0–0.7)
Eosinophils Relative: 1.6 % (ref 0.0–5.0)
HCT: 37.8 % (ref 36.0–46.0)
Hemoglobin: 12.6 g/dL (ref 12.0–15.0)
Lymphocytes Relative: 31.8 % (ref 12.0–46.0)
Lymphs Abs: 2 10*3/uL (ref 0.7–4.0)
MCHC: 33.3 g/dL (ref 30.0–36.0)
MCV: 89.8 fl (ref 78.0–100.0)
Monocytes Absolute: 0.5 10*3/uL (ref 0.1–1.0)
Monocytes Relative: 7.7 % (ref 3.0–12.0)
Neutro Abs: 3.8 10*3/uL (ref 1.4–7.7)
Neutrophils Relative %: 58.7 % (ref 43.0–77.0)
Platelets: 298 10*3/uL (ref 150.0–400.0)
RBC: 4.21 Mil/uL (ref 3.87–5.11)
RDW: 13.5 % (ref 11.5–15.5)
WBC: 6.4 10*3/uL (ref 4.0–10.5)

## 2015-07-23 LAB — COMPREHENSIVE METABOLIC PANEL
ALT: 12 U/L (ref 0–35)
AST: 16 U/L (ref 0–37)
Albumin: 3.9 g/dL (ref 3.5–5.2)
Alkaline Phosphatase: 54 U/L (ref 39–117)
BUN: 17 mg/dL (ref 6–23)
CO2: 31 mEq/L (ref 19–32)
Calcium: 9.4 mg/dL (ref 8.4–10.5)
Chloride: 102 mEq/L (ref 96–112)
Creatinine, Ser: 0.97 mg/dL (ref 0.40–1.20)
GFR: 60.27 mL/min (ref >60.00)
Glucose, Bld: 97 mg/dL (ref 70–99)
Potassium: 3.9 mEq/L (ref 3.5–5.1)
Sodium: 137 mEq/L (ref 135–145)
Total Bilirubin: 0.3 mg/dL (ref 0.2–1.2)
Total Protein: 6 g/dL (ref 6.0–8.3)

## 2015-07-23 LAB — LIPASE: Lipase: 47 U/L (ref 11.0–59.0)

## 2015-07-23 NOTE — Progress Notes (Signed)
Patient ID: [name], female   DOB: 09-14-1945, 70 y.o.   MRN: [id number]     History of Present Illness: [name]  Who has previously been evaluated by Dr. Ardis Hughs. She was last evaluated in June 2013. Earlier this year she had been hospitalized with chest pain. She underwent cardiac evaluation and reports that her pains were "noncardiac". He has a long-standing history of GERD and had been on omeprazole daily for years. She was advised to continue her omeprazole by Dr. Ardis Hughs. She states she then saw her new PCP and was advised to stop her omeprazole and use it only on a when necessary basis. About a week ago she again developed epigastric pain and nausea. She reports that she has been having epigastric and right upper quadrant pain with bloating whenever she eats her right upper quadrant pain occasionally radiates to the scapula. She states for several days her stools looked light tan. She feels this is different than her previous GERD pain  And is concerned that she may have a gallbladder issue. She has been using her omeprazole every other day but feels it is not making a difference in her discomfort. She has not had any fever or chills. She also reports that she has been having some bloody spotting on the toilet tissue and some rectal discomfort. She thinks it is her hemorrhoids acting up. Her last colonoscopy was on 10/03/2007 by Dr. Sharlett Iles at which time she was noted to have some diverticulosis and hemorrhoids but an otherwise normal exam. She was advised to have surveillance in 10 years.   Past Medical History  Diagnosis Date  . Peripheral neuropathy ([place])   . Fibromyalgia   . Hyperlipemia   . Degenerative joint disease     cervical spine  . GERD (gastroesophageal reflux disease)   . Lumbar disc disease   . Hypertension   . Depression   . Carotid artery disease ([place])     Doppler, April, 2012,  [id number] R. ICA, [id number] LICA... no significant change... frequency of Doppler  moved to yearly because of stability  . Chest pain     Nuclear stress test 2006 normal  . Hx of colonoscopy   . Drug therapy     msucle aches with simvastatin, but tolerates 5mg  Creastor  . Migraines   . Small fiber neuropathy ([facility])   . Obesity   . Ejection fraction     EF 60%, echo, December 20, 2011  . Fatigue     June, 2013  . Diastolic dysfunction     Echo report, June, 2013  . Diverticulosis of colon (without mention of hemorrhage) 10/03/2007    Colonoscopy  . Internal hemorrhoids without mention of complication [id number]    Colonoscopy  . Reflux esophagitis 04/21/00    EGD  . IBS (irritable bowel syndrome)   . Sleep apnea   . Pancreatitis     Past Surgical History  Procedure Laterality Date  . Laparoscopic ovarian cystectomy  1960's  . Appendectomy  1961  . Shoulder surgery  2004    "frozen"; right  . Trigger finger release  1996    right thumb   Family History  Problem Relation Age of Onset  . Coronary artery disease Father   . Heart attack Father   . Heart disease Father     CAD/MI x 3, last fatal  . Diabetes Neg Hx   . Colon cancer Neg Hx   . Breast cancer Paternal Aunt  x 2  . Stroke Mother   . Dementia Mother   . Heart disease Mother     chf  . Obesity Brother   . Hypertension Brother    Social History  Substance Use Topics  . Smoking status: Former Smoker -- 1.00 packs/day for 15 years    Types: Cigarettes    Quit date: 07/11/1996  . Smokeless tobacco: Never Used  . Alcohol Use: No   Current Outpatient Prescriptions  Medication Sig Dispense Refill  . acetaminophen (TYLENOL) 500 MG tablet Take 1,000 mg by mouth every 8 (eight) hours as needed for mild pain or moderate pain.    Marland Kitchen aspirin 81 MG tablet Take 81 mg by mouth daily.     . citalopram (CELEXA) 20 MG tablet Take 1/2  tablet by mouth  daily (Patient taking differently: Take 10 mg by mouth daily. Take 1/2  tablet by mouth  daily) 45 tablet 3  . Coenzyme Q10 (CO Q 10 PO) Take 10 mLs by  mouth daily.    Marland Kitchen gabapentin (NEURONTIN) 300 MG capsule Take 1 capsule by mouth  twice daily 180 capsule 3  . hydrochlorothiazide (HYDRODIURIL) 25 MG tablet Take 1 tablet (25 mg total) by mouth daily. 90 tablet 3  . HYDROcodone-acetaminophen (NORCO) 10-325 MG per tablet Take 1 tablet by mouth every 6 (six) hours as needed. For pain 60 tablet 2  . Liniments (SALONPAS ARTHRITIS PAIN RELIEF EX) Apply 0.5 patches topically daily as needed (Pain).    Marland Kitchen losartan (COZAAR) 100 MG tablet Take 1 tablet (100 mg total) by mouth daily. 90 tablet 3  . methocarbamol (ROBAXIN) 500 MG tablet Take 500 mg by mouth 4 (four) times daily as needed for muscle spasms.    . Multiple Vitamin (MULTIVITAMIN WITH MINERALS) TABS Take 1 tablet by mouth daily.    Marland Kitchen omeprazole (PRILOSEC) 40 MG capsule Take 1 capsule (40 mg total) by mouth daily. 90 capsule 0  . simvastatin (ZOCOR) 20 MG tablet Take one tablet by mouth in the evenings three times weekly (Mon, Wed, Fri). 36 tablet 3  . [DISCONTINUED] diltiazem (DILACOR XR) 180 MG 24 hr capsule Take 1 capsule (180 mg total) by mouth daily. 30 capsule 1   No current facility-administered medications for this visit.   Allergies  Allergen Reactions  . Mobic [Meloxicam] Other (See Comments)    REACTION: Chest pain   . Codeine Nausea And Vomiting  . Nitrofuran Derivatives Diarrhea  . Temazepam Other (See Comments)    Memory loss   . Celebrex [Celecoxib] Rash  . Influenza Vac Split [Flu Virus Vaccine] Rash    Rash on face and body     Review of Systems: Gen: Denies any fever, chills, sweats, anorexia, fatigue, weakness, malaise, weight loss, and sleep disorder CV: Denies chest pain, angina, palpitations, syncope, orthopnea, PND, peripheral edema, and claudication. Resp: Denies dyspnea at rest, dyspnea with exercise, cough, sputum, wheezing, coughing up blood, and pleurisy. GI: Denies vomiting blood, jaundice, and fecal incontinence.   Denies dysphagia or odynophagia. GU :  Denies urinary burning, blood in urine, urinary frequency, urinary hesitancy, nocturnal urination, and urinary incontinence. MS: Denies joint pain, limitation of movement, and swelling, stiffness, low back pain, extremity pain. Denies muscle weakness, cramps, atrophy.  Derm: Denies rash, itching, dry skin, hives, moles, warts, or unhealing ulcers.  Psych: Denies depression, anxiety, memory loss, suicidal ideation, hallucinations, paranoia, and confusion. Heme: Denies bruising, bleeding, and enlarged lymph nodes. Neuro:  Denies any headaches, dizziness, paresthesia Endo:  Denies any problems with DM, thyroid, adrenal  LAB RESULTS:  Recent Labs  07/23/15 1407  WBC 6.4  HGB 12.6  HCT 37.8  PLT 298.0   BMET  Recent Labs  07/23/15 1407  NA 137  K 3.9  CL 102  CO2 31  GLUCOSE 97  BUN 17  CREATININE 0.97  CALCIUM 9.4   LFT  Recent Labs  07/23/15 1407  PROT 6.0  ALBUMIN 3.9  AST 16  ALT 12  ALKPHOS 54  BILITOT 0.3     Physical Exam: BP 104/60 mmHg  Pulse 76  Ht 5' 1.25" (1.556 m) General: Pleasant, well developed ,  occasionfemale in no acute distress Head: Normocephalic and atraumatic Eyes:  sclerae anicteric, conjunctiva pink  Ears: Normal auditory acuity Lungs: Clear throughout to auscultation Heart: Regular rate and rhythm Abdomen: Soft, non distended,  Mild epigastric and right upper quadrant tenderness to palpation with no rebound or guarding. No masses, no hepatomegaly. Normal bowel sounds Rectal:  Small external hemorrhoids and skin tags. Brown stool, heme negative. Musculoskeletal: Symmetrical with no gross deformities  Extremities: No edema  Neurological: Alert oriented x 4, grossly nonfocal Psychological:  Alert and cooperative. Normal mood and affect  Assessment and Recommendations:  49.-year-old female with a long-standing history of GERD presenting with epigastric and right upper quadrant pain that radiates to the scapula of one week's duration.  An antireflux regimen has been reviewed and she's been advised to use her omeprazole on a daily basis. A CBC, comprehensive metabolic panel, and lipase will be obtained and she will be scheduled for an abdominal ultrasound to evaluate for possible cholelithiasis or cholecystitis.  If her gallbladder ultrasound is nonrevealing and she continues to have postprandial right upper quadrant pain she will be considered for a HIDA scan with C[facility].  If this is nonrevealing, she will be scheduled for an EGD.    With regards to her hemorrhoids. She's been instructed to use Tucks wipes. She will try Calmol4 suppositories  1 per rectum 3 times daily for 7 days.    she will follow-up in 2 months, sooner if needed.          Shamra Bradeen, Deloris Ping 07/23/2015,

## 2015-07-23 NOTE — Patient Instructions (Addendum)
Your physician has requested that you go to the basement for lab work before leaving today.   You have been scheduled for an abdominal ultrasound at [facility] (1st floor of hospital) on Wed 07/29/15/ at 8 am. Please arrive 15 minutes prior to your appointment for registration. Make certain not to have anything to eat or drink 6 hours prior to your appointment. Should you need to reschedule your appointment, please contact radiology at [phone]. This test typically takes about 30 minutes to perform.  Use your omeprazole as directed.   Use Calmol4 suppositories one per rectum 2-3 times a day for 7-10 days.  We have given you a handout on hemorrhoids.   Please use OTC Tucks wipes.   Please call to make a follow up with Dr. Ardis Hughs in 3 months.

## 2015-07-24 NOTE — Progress Notes (Signed)
i agree with the above note, plan 

## 2015-07-29 ENCOUNTER — Ambulatory Visit (HOSPITAL_COMMUNITY)
Admission: RE | Admit: 2015-07-29 | Discharge: 2015-07-29 | Disposition: A | Discharge status: Home / Self Care | Payer: Medicare Other | Source: Ambulatory Visit | Attending: Physician Assistant | Admitting: Physician Assistant

## 2015-07-29 DIAGNOSIS — K219 Gastro-esophageal reflux disease without esophagitis: Secondary | ICD-10-CM

## 2015-07-29 DIAGNOSIS — R1011 Right upper quadrant pain: Secondary | ICD-10-CM | POA: Diagnosis not present

## 2015-07-29 DIAGNOSIS — R1013 Epigastric pain: Secondary | ICD-10-CM | POA: Insufficient documentation

## 2015-08-17 DIAGNOSIS — M545 Low back pain: Secondary | ICD-10-CM | POA: Diagnosis not present

## 2015-08-17 DIAGNOSIS — M5030 Other cervical disc degeneration, unspecified cervical region: Secondary | ICD-10-CM | POA: Diagnosis not present

## 2015-08-17 DIAGNOSIS — M542 Cervicalgia: Secondary | ICD-10-CM | POA: Diagnosis not present

## 2015-08-17 DIAGNOSIS — M5136 Other intervertebral disc degeneration, lumbar region: Secondary | ICD-10-CM | POA: Diagnosis not present

## 2015-08-17 DIAGNOSIS — M9903 Segmental and somatic dysfunction of lumbar region: Secondary | ICD-10-CM | POA: Diagnosis not present

## 2015-08-17 DIAGNOSIS — M546 Pain in thoracic spine: Secondary | ICD-10-CM | POA: Diagnosis not present

## 2015-08-17 DIAGNOSIS — M5134 Other intervertebral disc degeneration, thoracic region: Secondary | ICD-10-CM | POA: Diagnosis not present

## 2015-08-17 DIAGNOSIS — M9901 Segmental and somatic dysfunction of cervical region: Secondary | ICD-10-CM | POA: Diagnosis not present

## 2015-08-17 DIAGNOSIS — M9902 Segmental and somatic dysfunction of thoracic region: Secondary | ICD-10-CM | POA: Diagnosis not present

## 2015-08-19 DIAGNOSIS — M542 Cervicalgia: Secondary | ICD-10-CM | POA: Diagnosis not present

## 2015-08-19 DIAGNOSIS — M5030 Other cervical disc degeneration, unspecified cervical region: Secondary | ICD-10-CM | POA: Diagnosis not present

## 2015-08-19 DIAGNOSIS — M5134 Other intervertebral disc degeneration, thoracic region: Secondary | ICD-10-CM | POA: Diagnosis not present

## 2015-08-19 DIAGNOSIS — M9902 Segmental and somatic dysfunction of thoracic region: Secondary | ICD-10-CM | POA: Diagnosis not present

## 2015-08-19 DIAGNOSIS — M545 Low back pain: Secondary | ICD-10-CM | POA: Diagnosis not present

## 2015-08-19 DIAGNOSIS — M546 Pain in thoracic spine: Secondary | ICD-10-CM | POA: Diagnosis not present

## 2015-08-19 DIAGNOSIS — M9901 Segmental and somatic dysfunction of cervical region: Secondary | ICD-10-CM | POA: Diagnosis not present

## 2015-08-19 DIAGNOSIS — M5136 Other intervertebral disc degeneration, lumbar region: Secondary | ICD-10-CM | POA: Diagnosis not present

## 2015-08-19 DIAGNOSIS — M9903 Segmental and somatic dysfunction of lumbar region: Secondary | ICD-10-CM | POA: Diagnosis not present

## 2015-08-21 DIAGNOSIS — M5136 Other intervertebral disc degeneration, lumbar region: Secondary | ICD-10-CM | POA: Diagnosis not present

## 2015-08-21 DIAGNOSIS — M542 Cervicalgia: Secondary | ICD-10-CM | POA: Diagnosis not present

## 2015-08-21 DIAGNOSIS — M5030 Other cervical disc degeneration, unspecified cervical region: Secondary | ICD-10-CM | POA: Diagnosis not present

## 2015-08-21 DIAGNOSIS — M9903 Segmental and somatic dysfunction of lumbar region: Secondary | ICD-10-CM | POA: Diagnosis not present

## 2015-08-21 DIAGNOSIS — M5134 Other intervertebral disc degeneration, thoracic region: Secondary | ICD-10-CM | POA: Diagnosis not present

## 2015-08-21 DIAGNOSIS — M546 Pain in thoracic spine: Secondary | ICD-10-CM | POA: Diagnosis not present

## 2015-08-21 DIAGNOSIS — M545 Low back pain: Secondary | ICD-10-CM | POA: Diagnosis not present

## 2015-08-21 DIAGNOSIS — M9902 Segmental and somatic dysfunction of thoracic region: Secondary | ICD-10-CM | POA: Diagnosis not present

## 2015-08-21 DIAGNOSIS — M9901 Segmental and somatic dysfunction of cervical region: Secondary | ICD-10-CM | POA: Diagnosis not present

## 2015-08-24 DIAGNOSIS — M9901 Segmental and somatic dysfunction of cervical region: Secondary | ICD-10-CM | POA: Diagnosis not present

## 2015-08-24 DIAGNOSIS — M9902 Segmental and somatic dysfunction of thoracic region: Secondary | ICD-10-CM | POA: Diagnosis not present

## 2015-08-24 DIAGNOSIS — M546 Pain in thoracic spine: Secondary | ICD-10-CM | POA: Diagnosis not present

## 2015-08-24 DIAGNOSIS — M5030 Other cervical disc degeneration, unspecified cervical region: Secondary | ICD-10-CM | POA: Diagnosis not present

## 2015-08-24 DIAGNOSIS — M545 Low back pain: Secondary | ICD-10-CM | POA: Diagnosis not present

## 2015-08-24 DIAGNOSIS — M5134 Other intervertebral disc degeneration, thoracic region: Secondary | ICD-10-CM | POA: Diagnosis not present

## 2015-08-24 DIAGNOSIS — M5136 Other intervertebral disc degeneration, lumbar region: Secondary | ICD-10-CM | POA: Diagnosis not present

## 2015-08-24 DIAGNOSIS — M542 Cervicalgia: Secondary | ICD-10-CM | POA: Diagnosis not present

## 2015-08-24 DIAGNOSIS — M9903 Segmental and somatic dysfunction of lumbar region: Secondary | ICD-10-CM | POA: Diagnosis not present

## 2015-08-26 DIAGNOSIS — M9903 Segmental and somatic dysfunction of lumbar region: Secondary | ICD-10-CM | POA: Diagnosis not present

## 2015-08-26 DIAGNOSIS — M545 Low back pain: Secondary | ICD-10-CM | POA: Diagnosis not present

## 2015-08-26 DIAGNOSIS — M9901 Segmental and somatic dysfunction of cervical region: Secondary | ICD-10-CM | POA: Diagnosis not present

## 2015-08-26 DIAGNOSIS — M5030 Other cervical disc degeneration, unspecified cervical region: Secondary | ICD-10-CM | POA: Diagnosis not present

## 2015-08-26 DIAGNOSIS — M546 Pain in thoracic spine: Secondary | ICD-10-CM | POA: Diagnosis not present

## 2015-08-26 DIAGNOSIS — M5134 Other intervertebral disc degeneration, thoracic region: Secondary | ICD-10-CM | POA: Diagnosis not present

## 2015-08-26 DIAGNOSIS — M542 Cervicalgia: Secondary | ICD-10-CM | POA: Diagnosis not present

## 2015-08-26 DIAGNOSIS — M9902 Segmental and somatic dysfunction of thoracic region: Secondary | ICD-10-CM | POA: Diagnosis not present

## 2015-08-26 DIAGNOSIS — M5136 Other intervertebral disc degeneration, lumbar region: Secondary | ICD-10-CM | POA: Diagnosis not present

## 2015-08-28 DIAGNOSIS — M5136 Other intervertebral disc degeneration, lumbar region: Secondary | ICD-10-CM | POA: Diagnosis not present

## 2015-08-28 DIAGNOSIS — M5134 Other intervertebral disc degeneration, thoracic region: Secondary | ICD-10-CM | POA: Diagnosis not present

## 2015-08-28 DIAGNOSIS — M546 Pain in thoracic spine: Secondary | ICD-10-CM | POA: Diagnosis not present

## 2015-08-28 DIAGNOSIS — M9902 Segmental and somatic dysfunction of thoracic region: Secondary | ICD-10-CM | POA: Diagnosis not present

## 2015-08-28 DIAGNOSIS — M5030 Other cervical disc degeneration, unspecified cervical region: Secondary | ICD-10-CM | POA: Diagnosis not present

## 2015-08-28 DIAGNOSIS — M545 Low back pain: Secondary | ICD-10-CM | POA: Diagnosis not present

## 2015-08-28 DIAGNOSIS — M9903 Segmental and somatic dysfunction of lumbar region: Secondary | ICD-10-CM | POA: Diagnosis not present

## 2015-08-28 DIAGNOSIS — M9901 Segmental and somatic dysfunction of cervical region: Secondary | ICD-10-CM | POA: Diagnosis not present

## 2015-08-28 DIAGNOSIS — M542 Cervicalgia: Secondary | ICD-10-CM | POA: Diagnosis not present

## 2015-08-31 DIAGNOSIS — M546 Pain in thoracic spine: Secondary | ICD-10-CM | POA: Diagnosis not present

## 2015-08-31 DIAGNOSIS — M9903 Segmental and somatic dysfunction of lumbar region: Secondary | ICD-10-CM | POA: Diagnosis not present

## 2015-08-31 DIAGNOSIS — M5136 Other intervertebral disc degeneration, lumbar region: Secondary | ICD-10-CM | POA: Diagnosis not present

## 2015-08-31 DIAGNOSIS — M9902 Segmental and somatic dysfunction of thoracic region: Secondary | ICD-10-CM | POA: Diagnosis not present

## 2015-08-31 DIAGNOSIS — M542 Cervicalgia: Secondary | ICD-10-CM | POA: Diagnosis not present

## 2015-08-31 DIAGNOSIS — M5134 Other intervertebral disc degeneration, thoracic region: Secondary | ICD-10-CM | POA: Diagnosis not present

## 2015-08-31 DIAGNOSIS — M9901 Segmental and somatic dysfunction of cervical region: Secondary | ICD-10-CM | POA: Diagnosis not present

## 2015-08-31 DIAGNOSIS — M5030 Other cervical disc degeneration, unspecified cervical region: Secondary | ICD-10-CM | POA: Diagnosis not present

## 2015-08-31 DIAGNOSIS — M545 Low back pain: Secondary | ICD-10-CM | POA: Diagnosis not present

## 2015-09-02 DIAGNOSIS — M546 Pain in thoracic spine: Secondary | ICD-10-CM | POA: Diagnosis not present

## 2015-09-02 DIAGNOSIS — M9903 Segmental and somatic dysfunction of lumbar region: Secondary | ICD-10-CM | POA: Diagnosis not present

## 2015-09-02 DIAGNOSIS — M9901 Segmental and somatic dysfunction of cervical region: Secondary | ICD-10-CM | POA: Diagnosis not present

## 2015-09-02 DIAGNOSIS — M542 Cervicalgia: Secondary | ICD-10-CM | POA: Diagnosis not present

## 2015-09-02 DIAGNOSIS — M9902 Segmental and somatic dysfunction of thoracic region: Secondary | ICD-10-CM | POA: Diagnosis not present

## 2015-09-02 DIAGNOSIS — M5030 Other cervical disc degeneration, unspecified cervical region: Secondary | ICD-10-CM | POA: Diagnosis not present

## 2015-09-02 DIAGNOSIS — M545 Low back pain: Secondary | ICD-10-CM | POA: Diagnosis not present

## 2015-09-02 DIAGNOSIS — M5136 Other intervertebral disc degeneration, lumbar region: Secondary | ICD-10-CM | POA: Diagnosis not present

## 2015-09-02 DIAGNOSIS — M5134 Other intervertebral disc degeneration, thoracic region: Secondary | ICD-10-CM | POA: Diagnosis not present

## 2015-09-07 DIAGNOSIS — M9902 Segmental and somatic dysfunction of thoracic region: Secondary | ICD-10-CM | POA: Diagnosis not present

## 2015-09-07 DIAGNOSIS — M5030 Other cervical disc degeneration, unspecified cervical region: Secondary | ICD-10-CM | POA: Diagnosis not present

## 2015-09-07 DIAGNOSIS — M546 Pain in thoracic spine: Secondary | ICD-10-CM | POA: Diagnosis not present

## 2015-09-07 DIAGNOSIS — M5136 Other intervertebral disc degeneration, lumbar region: Secondary | ICD-10-CM | POA: Diagnosis not present

## 2015-09-07 DIAGNOSIS — M9901 Segmental and somatic dysfunction of cervical region: Secondary | ICD-10-CM | POA: Diagnosis not present

## 2015-09-07 DIAGNOSIS — M545 Low back pain: Secondary | ICD-10-CM | POA: Diagnosis not present

## 2015-09-07 DIAGNOSIS — M9903 Segmental and somatic dysfunction of lumbar region: Secondary | ICD-10-CM | POA: Diagnosis not present

## 2015-09-07 DIAGNOSIS — M542 Cervicalgia: Secondary | ICD-10-CM | POA: Diagnosis not present

## 2015-09-07 DIAGNOSIS — M5134 Other intervertebral disc degeneration, thoracic region: Secondary | ICD-10-CM | POA: Diagnosis not present

## 2015-09-09 DIAGNOSIS — M9901 Segmental and somatic dysfunction of cervical region: Secondary | ICD-10-CM | POA: Diagnosis not present

## 2015-09-09 DIAGNOSIS — M5030 Other cervical disc degeneration, unspecified cervical region: Secondary | ICD-10-CM | POA: Diagnosis not present

## 2015-09-09 DIAGNOSIS — M9902 Segmental and somatic dysfunction of thoracic region: Secondary | ICD-10-CM | POA: Diagnosis not present

## 2015-09-09 DIAGNOSIS — M5136 Other intervertebral disc degeneration, lumbar region: Secondary | ICD-10-CM | POA: Diagnosis not present

## 2015-09-09 DIAGNOSIS — M5134 Other intervertebral disc degeneration, thoracic region: Secondary | ICD-10-CM | POA: Diagnosis not present

## 2015-09-09 DIAGNOSIS — M542 Cervicalgia: Secondary | ICD-10-CM | POA: Diagnosis not present

## 2015-09-09 DIAGNOSIS — M545 Low back pain: Secondary | ICD-10-CM | POA: Diagnosis not present

## 2015-09-09 DIAGNOSIS — M546 Pain in thoracic spine: Secondary | ICD-10-CM | POA: Diagnosis not present

## 2015-09-09 DIAGNOSIS — M9903 Segmental and somatic dysfunction of lumbar region: Secondary | ICD-10-CM | POA: Diagnosis not present

## 2015-09-14 DIAGNOSIS — M5136 Other intervertebral disc degeneration, lumbar region: Secondary | ICD-10-CM | POA: Diagnosis not present

## 2015-09-14 DIAGNOSIS — M546 Pain in thoracic spine: Secondary | ICD-10-CM | POA: Diagnosis not present

## 2015-09-14 DIAGNOSIS — M545 Low back pain: Secondary | ICD-10-CM | POA: Diagnosis not present

## 2015-09-14 DIAGNOSIS — M542 Cervicalgia: Secondary | ICD-10-CM | POA: Diagnosis not present

## 2015-09-14 DIAGNOSIS — M9901 Segmental and somatic dysfunction of cervical region: Secondary | ICD-10-CM | POA: Diagnosis not present

## 2015-09-14 DIAGNOSIS — M5030 Other cervical disc degeneration, unspecified cervical region: Secondary | ICD-10-CM | POA: Diagnosis not present

## 2015-09-14 DIAGNOSIS — M9903 Segmental and somatic dysfunction of lumbar region: Secondary | ICD-10-CM | POA: Diagnosis not present

## 2015-09-14 DIAGNOSIS — M9902 Segmental and somatic dysfunction of thoracic region: Secondary | ICD-10-CM | POA: Diagnosis not present

## 2015-09-14 DIAGNOSIS — M5134 Other intervertebral disc degeneration, thoracic region: Secondary | ICD-10-CM | POA: Diagnosis not present

## 2015-09-22 DIAGNOSIS — M542 Cervicalgia: Secondary | ICD-10-CM | POA: Diagnosis not present

## 2015-09-22 DIAGNOSIS — M9903 Segmental and somatic dysfunction of lumbar region: Secondary | ICD-10-CM | POA: Diagnosis not present

## 2015-09-22 DIAGNOSIS — M545 Low back pain: Secondary | ICD-10-CM | POA: Diagnosis not present

## 2015-09-22 DIAGNOSIS — M546 Pain in thoracic spine: Secondary | ICD-10-CM | POA: Diagnosis not present

## 2015-09-22 DIAGNOSIS — M5136 Other intervertebral disc degeneration, lumbar region: Secondary | ICD-10-CM | POA: Diagnosis not present

## 2015-09-22 DIAGNOSIS — M5134 Other intervertebral disc degeneration, thoracic region: Secondary | ICD-10-CM | POA: Diagnosis not present

## 2015-09-22 DIAGNOSIS — M9901 Segmental and somatic dysfunction of cervical region: Secondary | ICD-10-CM | POA: Diagnosis not present

## 2015-09-22 DIAGNOSIS — M5030 Other cervical disc degeneration, unspecified cervical region: Secondary | ICD-10-CM | POA: Diagnosis not present

## 2015-09-22 DIAGNOSIS — M9902 Segmental and somatic dysfunction of thoracic region: Secondary | ICD-10-CM | POA: Diagnosis not present

## 2015-09-29 ENCOUNTER — Ambulatory Visit: Payer: Medicare Other | Admitting: Gastroenterology

## 2015-10-21 DIAGNOSIS — Z6830 Body mass index (BMI) 30.0-30.9, adult: Secondary | ICD-10-CM | POA: Diagnosis not present

## 2015-10-21 DIAGNOSIS — M5136 Other intervertebral disc degeneration, lumbar region: Secondary | ICD-10-CM | POA: Diagnosis not present

## 2015-10-21 DIAGNOSIS — E785 Hyperlipidemia, unspecified: Secondary | ICD-10-CM | POA: Diagnosis not present

## 2015-10-21 DIAGNOSIS — G603 Idiopathic progressive neuropathy: Secondary | ICD-10-CM | POA: Diagnosis not present

## 2015-10-21 DIAGNOSIS — M8580 Other specified disorders of bone density and structure, unspecified site: Secondary | ICD-10-CM | POA: Diagnosis not present

## 2015-10-21 DIAGNOSIS — F329 Major depressive disorder, single episode, unspecified: Secondary | ICD-10-CM | POA: Diagnosis not present

## 2015-10-21 DIAGNOSIS — I10 Essential (primary) hypertension: Secondary | ICD-10-CM | POA: Diagnosis not present

## 2015-10-21 DIAGNOSIS — K219 Gastro-esophageal reflux disease without esophagitis: Secondary | ICD-10-CM | POA: Diagnosis not present

## 2015-10-21 DIAGNOSIS — E669 Obesity, unspecified: Secondary | ICD-10-CM | POA: Diagnosis not present

## 2015-10-21 DIAGNOSIS — M5431 Sciatica, right side: Secondary | ICD-10-CM | POA: Diagnosis not present

## 2015-11-27 DIAGNOSIS — Z872 Personal history of diseases of the skin and subcutaneous tissue: Secondary | ICD-10-CM | POA: Diagnosis not present

## 2015-11-27 DIAGNOSIS — D2339 Other benign neoplasm of skin of other parts of face: Secondary | ICD-10-CM | POA: Diagnosis not present

## 2015-11-27 DIAGNOSIS — D1801 Hemangioma of skin and subcutaneous tissue: Secondary | ICD-10-CM | POA: Diagnosis not present

## 2015-11-27 DIAGNOSIS — D225 Melanocytic nevi of trunk: Secondary | ICD-10-CM | POA: Diagnosis not present

## 2015-11-27 DIAGNOSIS — L814 Other melanin hyperpigmentation: Secondary | ICD-10-CM | POA: Diagnosis not present

## 2016-01-14 DIAGNOSIS — Z803 Family history of malignant neoplasm of breast: Secondary | ICD-10-CM | POA: Diagnosis not present

## 2016-01-14 DIAGNOSIS — Z1231 Encounter for screening mammogram for malignant neoplasm of breast: Secondary | ICD-10-CM | POA: Diagnosis not present

## 2016-01-18 ENCOUNTER — Telehealth: Payer: Self-pay | Admitting: Cardiology

## 2016-01-18 DIAGNOSIS — I739 Peripheral vascular disease, unspecified: Principal | ICD-10-CM

## 2016-01-18 DIAGNOSIS — I779 Disorder of arteries and arterioles, unspecified: Secondary | ICD-10-CM

## 2016-01-18 NOTE — Telephone Encounter (Signed)
Pt is not established with a cardiologist since Dr Ron Parker retired. She is due for f/u now and her Carotid duplex was due in April 2017. I have requested that Lorenda Peck, Clinical supervisor,  talk with scheduling to establish the pt a new cardiologist and then the nursing staff can order her Carotid Duplex under the correct cardiologist.

## 2016-01-18 NOTE — Telephone Encounter (Signed)
New message   Pt is calling to see who her new Dr. Is she is a former Forensic psychologist pt and   It shows that [place] did her Date of Consult: 12/08/2014/ she wants rn to call he to see when is it time to schedule corotid of her neck

## 2016-01-18 NOTE — Telephone Encounter (Signed)
The pt is now scheduled to see Dr Johnsie Cancel on 8/8. I have ordered the pts Carotid Duplex and sent a message to [facility] to call the pt to arrange date and time.  The pt is aware.  Will forward this message to Dr Johnsie Cancel and his nurse, Jeannene Patella, as Juluis Rainier.

## 2016-01-21 ENCOUNTER — Ambulatory Visit (HOSPITAL_COMMUNITY)
Admission: RE | Admit: 2016-01-21 | Discharge: 2016-01-21 | Disposition: A | Discharge status: Home / Self Care | Payer: Medicare Other | Source: Ambulatory Visit | Attending: Cardiovascular Disease | Admitting: Cardiovascular Disease

## 2016-01-21 DIAGNOSIS — K219 Gastro-esophageal reflux disease without esophagitis: Secondary | ICD-10-CM | POA: Diagnosis not present

## 2016-01-21 DIAGNOSIS — I6523 Occlusion and stenosis of bilateral carotid arteries: Secondary | ICD-10-CM | POA: Diagnosis not present

## 2016-01-21 DIAGNOSIS — I10 Essential (primary) hypertension: Secondary | ICD-10-CM | POA: Diagnosis not present

## 2016-01-21 DIAGNOSIS — F329 Major depressive disorder, single episode, unspecified: Secondary | ICD-10-CM | POA: Insufficient documentation

## 2016-01-21 DIAGNOSIS — I779 Disorder of arteries and arterioles, unspecified: Secondary | ICD-10-CM | POA: Diagnosis not present

## 2016-01-21 DIAGNOSIS — G4730 Sleep apnea, unspecified: Secondary | ICD-10-CM | POA: Diagnosis not present

## 2016-01-21 DIAGNOSIS — I739 Peripheral vascular disease, unspecified: Secondary | ICD-10-CM

## 2016-01-21 DIAGNOSIS — E785 Hyperlipidemia, unspecified: Secondary | ICD-10-CM | POA: Insufficient documentation

## 2016-01-25 ENCOUNTER — Telehealth: Payer: Self-pay | Admitting: Cardiovascular Disease

## 2016-01-25 NOTE — Telephone Encounter (Signed)
Informed pt of carotid results. Pt verbalized understanding.  

## 2016-01-25 NOTE — Telephone Encounter (Signed)
F/u message ° °Pt returning RN call. Please call back to discuss  °

## 2016-02-03 DIAGNOSIS — L988 Other specified disorders of the skin and subcutaneous tissue: Secondary | ICD-10-CM | POA: Diagnosis not present

## 2016-02-08 DIAGNOSIS — M19041 Primary osteoarthritis, right hand: Secondary | ICD-10-CM | POA: Diagnosis not present

## 2016-02-08 DIAGNOSIS — M67441 Ganglion, right hand: Secondary | ICD-10-CM | POA: Diagnosis not present

## 2016-02-08 DIAGNOSIS — M6748 Ganglion, other site: Secondary | ICD-10-CM | POA: Diagnosis not present

## 2016-02-12 ENCOUNTER — Ambulatory Visit: Payer: Medicare Other | Admitting: Cardiovascular Disease

## 2016-02-15 ENCOUNTER — Other Ambulatory Visit: Payer: Self-pay | Admitting: Orthopedic Surgery

## 2016-02-15 DIAGNOSIS — M67441 Ganglion, right hand: Secondary | ICD-10-CM | POA: Diagnosis not present

## 2016-02-15 DIAGNOSIS — M19041 Primary osteoarthritis, right hand: Secondary | ICD-10-CM | POA: Diagnosis not present

## 2016-02-16 ENCOUNTER — Ambulatory Visit: Payer: Medicare Other | Admitting: Cardiovascular Disease

## 2016-02-25 ENCOUNTER — Encounter: Payer: Self-pay | Admitting: Cardiovascular Disease

## 2016-03-11 DIAGNOSIS — M65312 Trigger thumb, left thumb: Secondary | ICD-10-CM | POA: Diagnosis not present

## 2016-03-23 ENCOUNTER — Ambulatory Visit: Payer: Medicare Other | Admitting: Cardiovascular Disease

## 2016-03-28 DIAGNOSIS — M7061 Trochanteric bursitis, right hip: Secondary | ICD-10-CM | POA: Diagnosis not present

## 2016-03-28 DIAGNOSIS — M7062 Trochanteric bursitis, left hip: Secondary | ICD-10-CM | POA: Diagnosis not present

## 2016-03-30 DIAGNOSIS — M8580 Other specified disorders of bone density and structure, unspecified site: Secondary | ICD-10-CM | POA: Diagnosis not present

## 2016-03-30 DIAGNOSIS — Z0001 Encounter for general adult medical examination with abnormal findings: Secondary | ICD-10-CM | POA: Diagnosis not present

## 2016-03-30 DIAGNOSIS — I10 Essential (primary) hypertension: Secondary | ICD-10-CM | POA: Diagnosis not present

## 2016-03-30 DIAGNOSIS — L988 Other specified disorders of the skin and subcutaneous tissue: Secondary | ICD-10-CM | POA: Diagnosis not present

## 2016-03-30 DIAGNOSIS — M5136 Other intervertebral disc degeneration, lumbar region: Secondary | ICD-10-CM | POA: Diagnosis not present

## 2016-03-30 DIAGNOSIS — Z6830 Body mass index (BMI) 30.0-30.9, adult: Secondary | ICD-10-CM | POA: Diagnosis not present

## 2016-03-30 DIAGNOSIS — F324 Major depressive disorder, single episode, in partial remission: Secondary | ICD-10-CM | POA: Diagnosis not present

## 2016-03-30 DIAGNOSIS — Z23 Encounter for immunization: Secondary | ICD-10-CM | POA: Diagnosis not present

## 2016-03-30 DIAGNOSIS — G603 Idiopathic progressive neuropathy: Secondary | ICD-10-CM | POA: Diagnosis not present

## 2016-03-30 DIAGNOSIS — E785 Hyperlipidemia, unspecified: Secondary | ICD-10-CM | POA: Diagnosis not present

## 2016-03-30 DIAGNOSIS — E559 Vitamin D deficiency, unspecified: Secondary | ICD-10-CM | POA: Diagnosis not present

## 2016-03-30 DIAGNOSIS — Z79899 Other long term (current) drug therapy: Secondary | ICD-10-CM | POA: Diagnosis not present

## 2016-03-30 DIAGNOSIS — E669 Obesity, unspecified: Secondary | ICD-10-CM | POA: Diagnosis not present

## 2016-04-27 DIAGNOSIS — G603 Idiopathic progressive neuropathy: Secondary | ICD-10-CM | POA: Diagnosis not present

## 2016-04-27 DIAGNOSIS — M5416 Radiculopathy, lumbar region: Secondary | ICD-10-CM | POA: Diagnosis not present

## 2016-04-27 DIAGNOSIS — F324 Major depressive disorder, single episode, in partial remission: Secondary | ICD-10-CM | POA: Diagnosis not present

## 2016-04-27 DIAGNOSIS — M5136 Other intervertebral disc degeneration, lumbar region: Secondary | ICD-10-CM | POA: Diagnosis not present

## 2016-05-05 ENCOUNTER — Other Ambulatory Visit: Payer: Self-pay | Admitting: Internal Medicine

## 2016-05-05 DIAGNOSIS — M5416 Radiculopathy, lumbar region: Secondary | ICD-10-CM

## 2016-05-14 ENCOUNTER — Inpatient Hospital Stay: Admission: RE | Admit: 2016-05-14 | Payer: Medicare Other | Source: Ambulatory Visit

## 2016-05-18 NOTE — Progress Notes (Signed)
Cardiology Office Note   Date:  05/20/2016   ID:  [name], DOB [birth year], MRN [id number]  PCP:  Henrine Screws, MD  Cardiologist:  Jenkins Rouge, MD   No chief complaint on file.     History of Present Illness: [name] is a 70 y.o. female who presents today to follow-up New to me previous patient of Dr Ron Parker She does not have documented coronary disease. She had some chest discomfort 2016  and was admitted for evaluation. During the hospitalization the nuclear stress study was normal. She was discharged home. It is clear now that her symptoms were related to GERD at that time. She is feeling better.  Compliant with BP meds  Has carotid dx with plaque no stenosis by duplex 01/21/16 new criteria  Cough last month from bronchitis. Husband health fine he has 2 childrenand 3 grandchildren  Past Medical History:  Diagnosis Date  . Carotid artery disease ([place])    Doppler, April, 2012,  [id number] R. ICA, [id number] LICA... no significant change... frequency of Doppler moved to yearly because of stability  . Chest pain    Nuclear stress test 2006 normal  . Degenerative joint disease    cervical spine  . Depression   . Diastolic dysfunction    Echo report, June, 2013  . Diverticulosis of colon (without mention of hemorrhage) 10/03/2007   Colonoscopy  . Drug therapy    msucle aches with simvastatin, but tolerates 5mg  Creastor  . Ejection fraction    EF 60%, echo, December 20, 2011  . Fatigue    June, 2013  . Fibromyalgia   . GERD (gastroesophageal reflux disease)   . Hx of colonoscopy   . Hyperlipemia   . Hypertension   . IBS (irritable bowel syndrome)   . Internal hemorrhoids without mention of complication [id number]   Colonoscopy  . Lumbar disc disease   . Migraines   . Obesity   . Pancreatitis   . Peripheral neuropathy ([place])   . Reflux esophagitis 04/21/00   EGD  . Sleep apnea   . Small fiber neuropathy [facility])     Past Surgical History:  Procedure  Laterality Date  . APPENDECTOMY  1961  . LAPAROSCOPIC OVARIAN CYSTECTOMY  1960's  . SHOULDER SURGERY  2004   "frozen"; right  . TRIGGER FINGER RELEASE  1996   right thumb    Patient Active Problem List   Diagnosis Date Noted  . Encounter for Medicare annual wellness exam 02/25/2015  . Insomnia 02/18/2015  . Precordial chest pain 01/12/2015  . Fatigue   . Diastolic dysfunction   . Ejection fraction   . Routine health maintenance 03/26/2011  . Carotid artery disease ([place])   . Drug therapy   . PEDAL EDEMA 03/17/2010  . INSOMNIA, CHRONIC 04/17/2009  . OSTEOARTHROSIS UNSPEC WHETHER GEN/LOC ANK&FOOT 01/05/2009  . Depression 05/22/2008  . TINNITUS 08/30/2007  . Hyperlipidemia 04/05/2007  . PERIPHERAL NEUROPATHY 04/05/2007  . Essential hypertension 04/05/2007  . GASTROESOPHAGEAL REFLUX DISEASE 04/05/2007  . DEGENERATIVE JOINT DISEASE, CERVICAL SPINE 04/05/2007  . [place] DISEASE, LUMBAR 04/05/2007  . Fibromyalgia 04/05/2007  . MIGRAINES, HX OF 04/05/2007  . APPENDECTOMY, HX OF 04/05/2007      Current Outpatient Prescriptions  Medication Sig Dispense Refill  . acetaminophen (TYLENOL) 500 MG tablet Take 1,000 mg by mouth every 8 (eight) hours as needed for mild pain or moderate pain.    Marland Kitchen aspirin 81 MG tablet Take 81 mg by mouth daily.     Marland Kitchen  citalopram (CELEXA) 20 MG tablet Take 1/2  tablet by mouth  daily (Patient taking differently: Take 10 mg by mouth every other day. Take 1/2  tablet by mouth  daily) 45 tablet 3  . gabapentin (NEURONTIN) 300 MG capsule Take 1 capsule by mouth  twice daily 180 capsule 3  . hydrochlorothiazide (HYDRODIURIL) 25 MG tablet Take 1 tablet (25 mg total) by mouth daily. 90 tablet 3  . HYDROcodone-acetaminophen (NORCO) 10-325 MG per tablet Take 1 tablet by mouth every 6 (six) hours as needed. For pain 60 tablet 2  . Liniments ([place] ARTHRITIS PAIN RELIEF EX) Apply 0.5 patches topically daily as needed (Pain).    Marland Kitchen losartan (COZAAR) 100 MG tablet Take 1  tablet (100 mg total) by mouth daily. 90 tablet 3  . methocarbamol (ROBAXIN) 500 MG tablet Take 500 mg by mouth 4 (four) times daily as needed for muscle spasms.    Marland Kitchen omeprazole (PRILOSEC) 40 MG capsule Take 1 capsule (40 mg total) by mouth daily. 90 capsule 0  . simvastatin (ZOCOR) 20 MG tablet Take one tablet by mouth in the evenings three times weekly (Mon, Wed, Fri). 36 tablet 3   No current facility-administered medications for this visit.     Allergies:   Mobic [meloxicam]; Codeine; Nitrofuran derivatives; Temazepam; Celebrex [celecoxib]; and Influenza vac split [flu virus vaccine]    Social History:  The patient  reports that she quit smoking about 19 years ago. Her smoking use included Cigarettes. She has a 15.00 pack-year smoking history. She has never used smokeless tobacco. She reports that she does not drink alcohol or use drugs.   Family History:  The patient's family history includes Breast cancer in her paternal aunt; Coronary artery disease in her father; Dementia in her mother; Heart attack in her father; Heart disease in her father and mother; Hypertension in her brother; Obesity in her brother; Stroke in her mother.    ROS:  Please see the history of present illness.     Patient denies fever, chills, headache, sweats, rash, change in vision, change in hearing, chest pain, cough, nausea or vomiting, urinary symptoms. All other systems are reviewed and are negative.   PHYSICAL EXAM: VS:  BP (!) 148/82 (BP Location: Right Arm, Patient Position: Sitting, Cuff Size: Normal)   Pulse 82   Resp 18   Ht 5\' 1"  (1.549 m)   Wt 74.8 kg (165 lb)   SpO2 98%   BMI 31.18 kg/m  , Patient looks quite good today. She is oriented to person time and place. Affect is normal. Head is atraumatic. Sclera and conjunctiva are normal. There is no jugulovenous distention. Lungs are clear. Respiratory effort is nonlabored. Cardiac exam reveals S1 and S2. The abdomen is soft. There is no peripheral  edema. There are no musculoskeletal deformities. There are no skin rashes. Neurologic is grossly intact.  EKG:   05/20/16 NSR normal ECG    Recent Labs: 07/23/2015: ALT 12; BUN 17; Creatinine, Ser 0.97; Hemoglobin 12.6; Platelets 298.0; Potassium 3.9; Sodium 137    Lipid Panel    Component Value Date/Time   CHOL 176 02/23/2015 0844   TRIG 62.0 02/23/2015 0844   TRIG 63 06/19/2006 0922   HDL 67.00 02/23/2015 0844   CHOLHDL 3 02/23/2015 0844   VLDL 12.4 02/23/2015 0844   LDLCALC 96 02/23/2015 0844      Wt Readings from Last 3 Encounters:  05/20/16 74.8 kg (165 lb)  02/25/15 70.2 kg (154 lb 12 oz)  02/18/15  70.4 kg (155 lb 4 oz)      Current medicines are reviewed  The patient understands her medicines.     ASSESSMENT AND PLAN: Chest Pain: resolved normal myovue 2016 HTN:  Well controlled.  Continue current medications and low sodium Dash type diet.   Carotid: plaque no stenosis f/u duplex 01/2018 ASA  Chol:   Cholesterol is at goal.  Continue current dose of statin and diet Rx.  No myalgias or side effects.  F/U  LFT's in 6 months. Lab Results  Component Value Date   LDLCALC 96 02/23/2015            Jenkins Rouge

## 2016-05-20 ENCOUNTER — Encounter: Payer: Self-pay | Admitting: Cardiovascular Disease

## 2016-05-20 ENCOUNTER — Ambulatory Visit (INDEPENDENT_AMBULATORY_CARE_PROVIDER_SITE_OTHER): Payer: Medicare Other | Admitting: Cardiovascular Disease

## 2016-05-20 VITALS — BP 148/82 | HR 82 | Resp 18 | Ht 61.0 in | Wt 165.0 lb

## 2016-05-20 DIAGNOSIS — I10 Essential (primary) hypertension: Secondary | ICD-10-CM | POA: Diagnosis not present

## 2016-05-20 NOTE — Patient Instructions (Addendum)

## 2016-05-23 NOTE — Addendum Note (Signed)
Addended by: Aris Georgia, Ellee Wawrzyniak L on: 05/23/2016 01:23 PM   Modules accepted: Orders

## 2016-05-31 ENCOUNTER — Ambulatory Visit
Admission: RE | Admit: 2016-05-31 | Discharge: 2016-05-31 | Disposition: A | Discharge status: Home / Self Care | Payer: Medicare Other | Source: Ambulatory Visit | Attending: Internal Medicine | Admitting: Internal Medicine

## 2016-05-31 DIAGNOSIS — M48061 Spinal stenosis, lumbar region without neurogenic claudication: Secondary | ICD-10-CM | POA: Diagnosis not present

## 2016-05-31 DIAGNOSIS — M5416 Radiculopathy, lumbar region: Secondary | ICD-10-CM

## 2016-06-07 DIAGNOSIS — F324 Major depressive disorder, single episode, in partial remission: Secondary | ICD-10-CM | POA: Diagnosis not present

## 2016-06-07 DIAGNOSIS — M5136 Other intervertebral disc degeneration, lumbar region: Secondary | ICD-10-CM | POA: Diagnosis not present

## 2016-06-07 DIAGNOSIS — M48061 Spinal stenosis, lumbar region without neurogenic claudication: Secondary | ICD-10-CM | POA: Diagnosis not present

## 2016-06-07 DIAGNOSIS — M5416 Radiculopathy, lumbar region: Secondary | ICD-10-CM | POA: Diagnosis not present

## 2016-06-07 DIAGNOSIS — G603 Idiopathic progressive neuropathy: Secondary | ICD-10-CM | POA: Diagnosis not present

## 2016-06-08 DIAGNOSIS — M65312 Trigger thumb, left thumb: Secondary | ICD-10-CM | POA: Diagnosis not present

## 2016-06-17 DIAGNOSIS — M4807 Spinal stenosis, lumbosacral region: Secondary | ICD-10-CM | POA: Diagnosis not present

## 2016-06-17 DIAGNOSIS — M5137 Other intervertebral disc degeneration, lumbosacral region: Secondary | ICD-10-CM | POA: Diagnosis not present

## 2016-06-17 DIAGNOSIS — Z6831 Body mass index (BMI) 31.0-31.9, adult: Secondary | ICD-10-CM | POA: Diagnosis not present

## 2016-06-17 DIAGNOSIS — I10 Essential (primary) hypertension: Secondary | ICD-10-CM | POA: Diagnosis not present

## 2016-07-06 DIAGNOSIS — M791 Myalgia: Secondary | ICD-10-CM | POA: Diagnosis not present

## 2016-07-06 DIAGNOSIS — J101 Influenza due to other identified influenza virus with other respiratory manifestations: Secondary | ICD-10-CM | POA: Diagnosis not present

## 2016-07-10 ENCOUNTER — Emergency Department (HOSPITAL_COMMUNITY)
Admission: EM | Admit: 2016-07-10 | Discharge: 2016-07-10 | Disposition: A | Discharge status: Home / Self Care | Payer: Medicare Other | Attending: Emergency Medicine | Admitting: Emergency Medicine

## 2016-07-10 ENCOUNTER — Emergency Department (HOSPITAL_COMMUNITY): Payer: Medicare Other

## 2016-07-10 ENCOUNTER — Encounter (HOSPITAL_COMMUNITY): Payer: Self-pay | Admitting: *Deleted

## 2016-07-10 DIAGNOSIS — I2510 Atherosclerotic heart disease of native coronary artery without angina pectoris: Secondary | ICD-10-CM | POA: Diagnosis not present

## 2016-07-10 DIAGNOSIS — R05 Cough: Secondary | ICD-10-CM | POA: Diagnosis not present

## 2016-07-10 DIAGNOSIS — J111 Influenza due to unidentified influenza virus with other respiratory manifestations: Secondary | ICD-10-CM | POA: Insufficient documentation

## 2016-07-10 DIAGNOSIS — I5030 Unspecified diastolic (congestive) heart failure: Secondary | ICD-10-CM | POA: Insufficient documentation

## 2016-07-10 DIAGNOSIS — I110 Hypertensive heart disease with heart failure: Secondary | ICD-10-CM | POA: Insufficient documentation

## 2016-07-10 DIAGNOSIS — R404 Transient alteration of awareness: Secondary | ICD-10-CM | POA: Diagnosis not present

## 2016-07-10 DIAGNOSIS — Z79899 Other long term (current) drug therapy: Secondary | ICD-10-CM | POA: Insufficient documentation

## 2016-07-10 DIAGNOSIS — J40 Bronchitis, not specified as acute or chronic: Secondary | ICD-10-CM | POA: Diagnosis not present

## 2016-07-10 DIAGNOSIS — Z87891 Personal history of nicotine dependence: Secondary | ICD-10-CM | POA: Insufficient documentation

## 2016-07-10 DIAGNOSIS — Z7982 Long term (current) use of aspirin: Secondary | ICD-10-CM | POA: Insufficient documentation

## 2016-07-10 DIAGNOSIS — R531 Weakness: Secondary | ICD-10-CM | POA: Diagnosis not present

## 2016-07-10 LAB — I-STAT CG4 LACTIC ACID, ED: Lactic Acid, Venous: 0.8 mmol/L (ref 0.5–1.9)

## 2016-07-10 LAB — CBC WITH DIFFERENTIAL/PLATELET
Basophils Absolute: 0 10*3/uL (ref 0.0–0.1)
Basophils Relative: 0 %
Eosinophils Absolute: 0 10*3/uL (ref 0.0–0.7)
Eosinophils Relative: 0 %
HCT: 39.3 % (ref 36.0–46.0)
Hemoglobin: 13.1 g/dL (ref 12.0–15.0)
Lymphocytes Relative: 19 %
Lymphs Abs: 1.7 10*3/uL (ref 0.7–4.0)
MCH: 29.6 pg (ref 26.0–34.0)
MCHC: 33.3 g/dL (ref 30.0–36.0)
MCV: 88.9 fL (ref 78.0–100.0)
Monocytes Absolute: 0.7 10*3/uL (ref 0.1–1.0)
Monocytes Relative: 8 %
Neutro Abs: 6.5 10*3/uL (ref 1.7–7.7)
Neutrophils Relative %: 73 %
Platelets: 302 10*3/uL (ref 150–400)
RBC: 4.42 MIL/uL (ref 3.87–5.11)
RDW: 12.9 % (ref 11.5–15.5)
WBC: 9 10*3/uL (ref 4.0–10.5)

## 2016-07-10 LAB — COMPREHENSIVE METABOLIC PANEL
ALT: 22 U/L (ref 14–54)
AST: 23 U/L (ref 15–41)
Albumin: 4.2 g/dL (ref 3.5–5.0)
Alkaline Phosphatase: 64 U/L (ref 38–126)
Anion gap: 9 (ref 5–15)
BUN: 18 mg/dL (ref 6–20)
CO2: 28 mmol/L (ref 22–32)
Calcium: 9.3 mg/dL (ref 8.9–10.3)
Chloride: 98 mmol/L — ABNORMAL LOW (ref 101–111)
Creatinine, Ser: 0.78 mg/dL (ref 0.44–1.00)
GFR calc Af Amer: >60 mL/min (ref >60)
GFR calc non Af Amer: >60 mL/min (ref >60)
Glucose, Bld: 104 mg/dL — ABNORMAL HIGH (ref 65–99)
Potassium: 3.3 mmol/L — ABNORMAL LOW (ref 3.5–5.1)
Sodium: 135 mmol/L (ref 135–145)
Total Bilirubin: 0.6 mg/dL (ref 0.3–1.2)
Total Protein: 7.4 g/dL (ref 6.5–8.1)

## 2016-07-10 LAB — URINALYSIS, ROUTINE W REFLEX MICROSCOPIC
Bilirubin Urine: NEGATIVE
Glucose, UA: NEGATIVE mg/dL
Hgb urine dipstick: NEGATIVE
Ketones, ur: NEGATIVE mg/dL
Leukocytes, UA: NEGATIVE
Nitrite: NEGATIVE
Protein, ur: NEGATIVE mg/dL
Specific Gravity, Urine: 1.015 (ref 1.005–1.030)
pH: 7 (ref 5.0–8.0)

## 2016-07-10 LAB — I-STAT TROPONIN, ED: Troponin i, poc: 0 ng/mL (ref 0.00–0.08)

## 2016-07-10 MED ORDER — SODIUM CHLORIDE 0.9 % IV BOLUS (SEPSIS)
1000.0000 mL | Freq: Once | INTRAVENOUS | Status: AC
  Administered 2016-07-10: 1000 mL via INTRAVENOUS

## 2016-07-10 MED ORDER — ALBUTEROL SULFATE HFA 108 (90 BASE) MCG/ACT IN AERS: 1.0000 | INHALATION_SPRAY | Freq: Four times a day (QID) | RESPIRATORY_TRACT | 0 refills | Status: DC | PRN

## 2016-07-10 NOTE — ED Triage Notes (Addendum)
EMS reports pt was seen and treated at [facility] on Christmas for Flu, feels like meds are not working, returned her rather than UC again. Ambulated to EMS truck. CBG 137   [id number]

## 2016-07-10 NOTE — ED Provider Notes (Signed)
[place] DEPT Provider Note   CSN: [id number] Arrival date & time: 07/10/16  1831     History   Chief Complaint Chief Complaint  Patient presents with  . Influenza    HPI [name] is a 70 y.o. female history of CAD, diastolic heart failure, HTN, HL, here presenting with cough, congestion, shortness of breath. Patient was diagnosed with flu A about 5 days ago and is almost done with her tamiflu. States that over the last several days, she has been diffusely weak. She has poor appetite as well. Denies any persistent fevers or vomiting or abdominal pain. Denies any urinary symptoms.  The history is provided by the patient.    Past Medical History:  Diagnosis Date  . Carotid artery disease ([place])    Doppler, April, 2012,  [id number] R. ICA, [id number] LICA... no significant change... frequency of Doppler moved to yearly because of stability  . Chest pain    Nuclear stress test 2006 normal  . Degenerative joint disease    cervical spine  . Depression   . Diastolic dysfunction    Echo report, June, 2013  . Diverticulosis of colon (without mention of hemorrhage) 10/03/2007   Colonoscopy  . Drug therapy    msucle aches with simvastatin, but tolerates 5mg  Creastor  . Ejection fraction    EF 60%, echo, December 20, 2011  . Fatigue    June, 2013  . Fibromyalgia   . GERD (gastroesophageal reflux disease)   . Hx of colonoscopy   . Hyperlipemia   . Hypertension   . IBS (irritable bowel syndrome)   . Internal hemorrhoids without mention of complication [id number]   Colonoscopy  . Lumbar disc disease   . Migraines   . Obesity   . Pancreatitis   . Peripheral neuropathy ([place])   . Reflux esophagitis 04/21/00   EGD  . Sleep apnea   . Small fiber neuropathy [facility])     Patient Active Problem List   Diagnosis Date Noted  . Encounter for Medicare annual wellness exam 02/25/2015  . Insomnia 02/18/2015  . Precordial chest pain 01/12/2015  . Fatigue   . Diastolic dysfunction   .  Ejection fraction   . Routine health maintenance 03/26/2011  . Carotid artery disease ([place])   . Drug therapy   . PEDAL EDEMA 03/17/2010  . INSOMNIA, CHRONIC 04/17/2009  . OSTEOARTHROSIS UNSPEC WHETHER GEN/LOC ANK&FOOT 01/05/2009  . Depression 05/22/2008  . TINNITUS 08/30/2007  . Hyperlipidemia 04/05/2007  . PERIPHERAL NEUROPATHY 04/05/2007  . Essential hypertension 04/05/2007  . GASTROESOPHAGEAL REFLUX DISEASE 04/05/2007  . DEGENERATIVE JOINT DISEASE, CERVICAL SPINE 04/05/2007  . [place] DISEASE, LUMBAR 04/05/2007  . Fibromyalgia 04/05/2007  . MIGRAINES, HX OF 04/05/2007  . APPENDECTOMY, HX OF 04/05/2007    Past Surgical History:  Procedure Laterality Date  . APPENDECTOMY  1961  . LAPAROSCOPIC OVARIAN CYSTECTOMY  1960's  . SHOULDER SURGERY  2004   "frozen"; right  . TRIGGER FINGER RELEASE  1996   right thumb    OB History    No data available       Home Medications    Prior to Admission medications   Medication Sig Start Date End Date Taking? Authorizing Provider  acetaminophen (TYLENOL) 500 MG tablet Take 1,000 mg by mouth every 8 (eight) hours as needed for mild pain or moderate pain.   Yes Historical Provider, MD  aspirin 81 MG tablet Take 81 mg by mouth daily.    Yes Historical Provider, MD  benzonatate Lavella Lemons)  200 MG capsule Take 200 mg by mouth 3 (three) times daily as needed for cough. Started 12/27 07/06/16  Yes Historical Provider, MD  citalopram (CELEXA) 20 MG tablet Take 1/2  tablet by mouth  daily Patient taking differently: Take 10 mg by mouth every other day. Take 1/2  tablet by mouth every other day 09/24/14  Yes Abner Greenspan, MD  gabapentin (NEURONTIN) 300 MG capsule Take 1 capsule by mouth  twice daily 09/24/14  Yes Abner Greenspan, MD  hydrochlorothiazide (HYDRODIURIL) 25 MG tablet Take 1 tablet (25 mg total) by mouth daily. 01/14/15  Yes Carlena Bjornstad, MD  HYDROcodone-acetaminophen [facility]) 10-325 MG per tablet Take 1 tablet by mouth every 6 (six) hours as  needed. For pain 02/12/13  Yes Neena Rhymes, MD  Liniments [facility] ARTHRITIS PAIN RELIEF EX) Apply 0.5 patches topically daily as needed (Pain).   Yes Historical Provider, MD  losartan (COZAAR) 100 MG tablet Take 1 tablet (100 mg total) by mouth daily. 01/14/15  Yes Carlena Bjornstad, MD  methocarbamol (ROBAXIN) 500 MG tablet Take 500 mg by mouth 4 (four) times daily as needed for muscle spasms.   Yes Historical Provider, MD  omeprazole (PRILOSEC) 40 MG capsule Take 1 capsule (40 mg total) by mouth daily. 01/19/15  Yes Abner Greenspan, MD  oseltamivir (TAMIFLU) 75 MG capsule Take 75 mg by mouth 2 (two) times daily. Started 12/27 07/06/16  Yes Historical Provider, MD  simvastatin (ZOCOR) 20 MG tablet Take one tablet by mouth in the evenings three times weekly (Mon, Wed, Fri). 09/24/14  Yes Abner Greenspan, MD  ondansetron (ZOFRAN-ODT) 4 MG disintegrating tablet Take 4 mg by mouth 2 (two) times daily as needed for nausea/vomiting. 07/08/16   Historical Provider, MD    Family History Family History  Problem Relation Age of Onset  . Coronary artery disease Father   . Heart attack Father   . Heart disease Father     CAD/MI x 3, last fatal  . Breast cancer Paternal Aunt     x 2  . Stroke Mother   . Dementia Mother   . Heart disease Mother     chf  . Obesity Brother   . Hypertension Brother   . Diabetes Neg Hx   . Colon cancer Neg Hx     Social History Social History  Substance Use Topics  . Smoking status: Former Smoker    Packs/day: 1.00    Years: 15.00    Types: Cigarettes    Quit date: 07/11/1996  . Smokeless tobacco: Never Used  . Alcohol use No     Allergies   Mobic [meloxicam]; Codeine; Nitrofuran derivatives; Temazepam; Celebrex [celecoxib]; and Influenza vac split [flu virus vaccine]   Review of Systems Review of Systems  Constitutional: Positive for appetite change.  Neurological: Positive for weakness.  All other systems reviewed and are negative.    Physical  Exam Updated Vital Signs BP 155/67   Pulse 80   Temp 97.7 F (36.5 C) (Oral)   Resp 20   SpO2 100%   Physical Exam  Constitutional: She is oriented to person, place, and time.  Tired, mildly dehydrated   HENT:  Head: Normocephalic.  MM slightly dry   Eyes: EOM are normal. Pupils are equal, round, and reactive to light.  Neck: Normal range of motion. Neck supple.  Cardiovascular: Normal rate, regular rhythm and normal heart sounds.   Pulmonary/Chest:  Diminished throughout, no obvious wheezing or crackles  Abdominal: Soft. Bowel sounds are normal. She exhibits no distension. There is no tenderness.  Musculoskeletal: Normal range of motion.  Neurological: She is alert and oriented to person, place, and time. No cranial nerve deficit. Coordination normal.  Skin: Skin is warm.  Psychiatric: She has a normal mood and affect.  Nursing note and vitals reviewed.    ED Treatments / Results  Labs (all labs ordered are listed, but only abnormal results are displayed) Labs Reviewed  COMPREHENSIVE METABOLIC PANEL - Abnormal; Notable for the following:       Result Value   Potassium 3.3 (*)    Chloride 98 (*)    Glucose, Bld 104 (*)    All other components within normal limits  CULTURE, BLOOD (ROUTINE X 2)  CULTURE, BLOOD (ROUTINE X 2)  URINE CULTURE  CBC WITH DIFFERENTIAL/PLATELET  URINALYSIS, ROUTINE W REFLEX MICROSCOPIC  I-STAT CG4 LACTIC ACID, ED  I-STAT TROPOININ, ED    EKG  EKG Interpretation  Date/Time:  Sunday July 10 2016 20:18:30 EST Ventricular Rate:  77 PR Interval:    QRS Duration: 99 QT Interval:  400 QTC Calculation: 453 R Axis:   63 Text Interpretation:  Sinus rhythm Consider left atrial enlargement No significant change since last tracing Confirmed by Herberto Ledwell  MD, Evanee Lubrano ([id number]) on 07/10/2016 8:39:50 PM       Radiology Dg Chest 2 View  Result Date: 07/10/2016 CLINICAL DATA:  Cough and weakness for 1 week. EXAM: CHEST  2 VIEW COMPARISON:  Chest  x-ray 12/08/2014 FINDINGS: The cardiac silhouette, mediastinal and hilar contours are within normal limits and stable. Mild peribronchial thickening could suggest bronchitis but no infiltrates or effusions. The bony thorax is intact. IMPRESSION: Possible mild bronchitis but no infiltrates or effusions. Electronically Signed   By: Marijo Sanes M.D.   On: 07/10/2016 19:41    Procedures Procedures (including critical care time)  Medications Ordered in ED Medications  sodium chloride 0.9 % bolus 1,000 mL (0 mLs Intravenous Stopped 07/10/16 2033)     Initial Impression / Assessment and Plan / ED Course  I have reviewed the triage vital signs and the nursing notes.  Pertinent labs & imaging results that were available during my care of the patient were reviewed by me and considered in my medical decision making (see chart for details).  Clinical Course     BEIJA BASICH is a 70 y.o. female here with cough, weakness. Consider post flu pneumonia vs persistent cough from influenza. Will check labs, cultures, CXR, UA. Will hydrate and reassess.   9:11 PM Labs unremarkable. UA nl. CXR showed bronchitis. No wheezing on exam. Patient has no pneumonia on CXR. Vitals stable. Will dc home with albuterol prn cough or bronchitis. Recommend continue tylenol, motrin, increase hydration. She asked about cough medicines and I told her to use over the counter medicines as prescription cough medicines may not be good with patient with flu.    Final Clinical Impressions(s) / ED Diagnoses   Final diagnoses:  None    New Prescriptions New Prescriptions   No medications on file     Drenda Freeze, MD 07/10/16 2112

## 2016-07-10 NOTE — Discharge Instructions (Signed)
Finish your tamiflu.   Take tylenol, motrin if you have chills or fevers.  Use mucinex or robitussin for cough or congestion   Use albuterol as needed for cough or trouble breathing.   See your doctor this week   Return to ER if you have worse cough, congestion, fever, vomiting, dehydration

## 2016-07-10 NOTE — ED Notes (Signed)
Bed: [id number] Expected date:  Expected time:  Means of arrival:  Comments: 70 yo flu-like s/sx-already seen and tx for same

## 2016-07-10 NOTE — ED Notes (Signed)
Patient transported to X-ray 

## 2016-07-12 LAB — URINE CULTURE

## 2016-07-16 LAB — CULTURE, BLOOD (ROUTINE X 2)
Culture: NO GROWTH
Culture: NO GROWTH

## 2016-07-18 DIAGNOSIS — M9903 Segmental and somatic dysfunction of lumbar region: Secondary | ICD-10-CM | POA: Diagnosis not present

## 2016-07-18 DIAGNOSIS — M5441 Lumbago with sciatica, right side: Secondary | ICD-10-CM | POA: Diagnosis not present

## 2016-07-26 DIAGNOSIS — M5441 Lumbago with sciatica, right side: Secondary | ICD-10-CM | POA: Diagnosis not present

## 2016-07-26 DIAGNOSIS — M9903 Segmental and somatic dysfunction of lumbar region: Secondary | ICD-10-CM | POA: Diagnosis not present

## 2016-08-12 DIAGNOSIS — K649 Unspecified hemorrhoids: Secondary | ICD-10-CM | POA: Diagnosis not present

## 2016-08-12 DIAGNOSIS — J069 Acute upper respiratory infection, unspecified: Secondary | ICD-10-CM | POA: Diagnosis not present

## 2016-08-15 DIAGNOSIS — M4807 Spinal stenosis, lumbosacral region: Secondary | ICD-10-CM | POA: Diagnosis not present

## 2016-08-15 DIAGNOSIS — M5137 Other intervertebral disc degeneration, lumbosacral region: Secondary | ICD-10-CM | POA: Diagnosis not present

## 2016-08-15 DIAGNOSIS — M47816 Spondylosis without myelopathy or radiculopathy, lumbar region: Secondary | ICD-10-CM | POA: Diagnosis not present

## 2016-09-01 DIAGNOSIS — M5441 Lumbago with sciatica, right side: Secondary | ICD-10-CM | POA: Diagnosis not present

## 2016-09-01 DIAGNOSIS — M9903 Segmental and somatic dysfunction of lumbar region: Secondary | ICD-10-CM | POA: Diagnosis not present

## 2016-09-26 DIAGNOSIS — M65312 Trigger thumb, left thumb: Secondary | ICD-10-CM | POA: Diagnosis not present

## 2016-10-31 DIAGNOSIS — M797 Fibromyalgia: Secondary | ICD-10-CM | POA: Diagnosis not present

## 2016-10-31 DIAGNOSIS — K219 Gastro-esophageal reflux disease without esophagitis: Secondary | ICD-10-CM | POA: Diagnosis not present

## 2016-10-31 DIAGNOSIS — G603 Idiopathic progressive neuropathy: Secondary | ICD-10-CM | POA: Diagnosis not present

## 2016-12-12 DIAGNOSIS — G4700 Insomnia, unspecified: Secondary | ICD-10-CM | POA: Diagnosis not present

## 2016-12-12 DIAGNOSIS — M797 Fibromyalgia: Secondary | ICD-10-CM | POA: Diagnosis not present

## 2016-12-12 DIAGNOSIS — E785 Hyperlipidemia, unspecified: Secondary | ICD-10-CM | POA: Diagnosis not present

## 2016-12-12 DIAGNOSIS — G603 Idiopathic progressive neuropathy: Secondary | ICD-10-CM | POA: Diagnosis not present

## 2016-12-12 DIAGNOSIS — K219 Gastro-esophageal reflux disease without esophagitis: Secondary | ICD-10-CM | POA: Diagnosis not present

## 2016-12-12 DIAGNOSIS — I10 Essential (primary) hypertension: Secondary | ICD-10-CM | POA: Diagnosis not present

## 2017-02-06 DIAGNOSIS — Z1231 Encounter for screening mammogram for malignant neoplasm of breast: Secondary | ICD-10-CM | POA: Diagnosis not present

## 2017-02-28 DIAGNOSIS — H52223 Regular astigmatism, bilateral: Secondary | ICD-10-CM | POA: Diagnosis not present

## 2017-03-01 DIAGNOSIS — M797 Fibromyalgia: Secondary | ICD-10-CM | POA: Diagnosis not present

## 2017-03-01 DIAGNOSIS — M62830 Muscle spasm of back: Secondary | ICD-10-CM | POA: Diagnosis not present

## 2017-05-16 DIAGNOSIS — M2550 Pain in unspecified joint: Secondary | ICD-10-CM | POA: Diagnosis not present

## 2017-05-16 DIAGNOSIS — Z1389 Encounter for screening for other disorder: Secondary | ICD-10-CM | POA: Diagnosis not present

## 2017-05-16 DIAGNOSIS — M797 Fibromyalgia: Secondary | ICD-10-CM | POA: Diagnosis not present

## 2017-05-16 DIAGNOSIS — Z79899 Other long term (current) drug therapy: Secondary | ICD-10-CM | POA: Diagnosis not present

## 2017-05-16 DIAGNOSIS — Z0000 Encounter for general adult medical examination without abnormal findings: Secondary | ICD-10-CM | POA: Diagnosis not present

## 2017-05-16 DIAGNOSIS — I10 Essential (primary) hypertension: Secondary | ICD-10-CM | POA: Diagnosis not present

## 2017-05-16 DIAGNOSIS — M62830 Muscle spasm of back: Secondary | ICD-10-CM | POA: Diagnosis not present

## 2017-05-16 DIAGNOSIS — F324 Major depressive disorder, single episode, in partial remission: Secondary | ICD-10-CM | POA: Diagnosis not present

## 2017-05-16 DIAGNOSIS — K219 Gastro-esophageal reflux disease without esophagitis: Secondary | ICD-10-CM | POA: Diagnosis not present

## 2017-05-16 DIAGNOSIS — G603 Idiopathic progressive neuropathy: Secondary | ICD-10-CM | POA: Diagnosis not present

## 2017-05-16 DIAGNOSIS — E785 Hyperlipidemia, unspecified: Secondary | ICD-10-CM | POA: Diagnosis not present

## 2017-05-29 DIAGNOSIS — I10 Essential (primary) hypertension: Secondary | ICD-10-CM | POA: Insufficient documentation

## 2017-05-29 DIAGNOSIS — M8589 Other specified disorders of bone density and structure, multiple sites: Secondary | ICD-10-CM | POA: Diagnosis not present

## 2017-05-29 DIAGNOSIS — K219 Gastro-esophageal reflux disease without esophagitis: Secondary | ICD-10-CM | POA: Insufficient documentation

## 2017-05-29 DIAGNOSIS — G4730 Sleep apnea, unspecified: Secondary | ICD-10-CM | POA: Insufficient documentation

## 2017-05-29 DIAGNOSIS — E785 Hyperlipidemia, unspecified: Secondary | ICD-10-CM | POA: Insufficient documentation

## 2017-05-29 DIAGNOSIS — M519 Unspecified thoracic, thoracolumbar and lumbosacral intervertebral disc disorder: Secondary | ICD-10-CM | POA: Insufficient documentation

## 2017-05-29 DIAGNOSIS — K589 Irritable bowel syndrome without diarrhea: Secondary | ICD-10-CM | POA: Insufficient documentation

## 2017-05-29 DIAGNOSIS — G43909 Migraine, unspecified, not intractable, without status migrainosus: Secondary | ICD-10-CM | POA: Insufficient documentation

## 2017-05-29 DIAGNOSIS — M1990 Unspecified osteoarthritis, unspecified site: Secondary | ICD-10-CM | POA: Insufficient documentation

## 2017-05-29 DIAGNOSIS — E669 Obesity, unspecified: Secondary | ICD-10-CM | POA: Insufficient documentation

## 2017-05-29 DIAGNOSIS — G629 Polyneuropathy, unspecified: Secondary | ICD-10-CM | POA: Insufficient documentation

## 2017-05-29 DIAGNOSIS — Z9889 Other specified postprocedural states: Secondary | ICD-10-CM | POA: Insufficient documentation

## 2017-05-29 DIAGNOSIS — K859 Acute pancreatitis without necrosis or infection, unspecified: Secondary | ICD-10-CM | POA: Insufficient documentation

## 2017-06-06 NOTE — Progress Notes (Signed)
Cardiology Office Note   Date:  06/09/2017   ID:  [name], DOB [birth year], MRN [id number]  PCP:  Catherine Huddle, MD  Cardiologist:  Catherine Rouge, MD   Chief Complaint  Patient presents with  . Follow-up    Essential Hypertension      History of Present Illness: [name] is a 71 y.o. female who presents today to follow-up Previous patient of Dr Ron Parker She does not have documented coronary disease. She had some chest discomfort 2016  and was admitted for evaluation. During the hospitalization the nuclear stress study was normal. She was discharged home. It is clear now that her symptoms were related to GERD at that time. She is feeling better.  Compliant with BP meds  Has carotid dx with plaque no stenosis by duplex 01/21/16 new criteria    Past Medical History:  Diagnosis Date  . Carotid artery disease ([place])    Doppler, April, 2012,  3-29% R. ICA, 92-42% LICA... no significant change... frequency of Doppler moved to yearly because of stability  . Chest pain    Nuclear stress test 2006 normal  . Degenerative joint disease    cervical spine  . Depression   . Diastolic dysfunction    Echo report, June, 2013  . Diverticulosis of colon (without mention of hemorrhage) 10/03/2007   Colonoscopy  . Drug therapy    msucle aches with simvastatin, but tolerates 5mg  Creastor  . Ejection fraction    EF 60%, echo, December 20, 2011  . Fatigue    June, 2013  . Fibromyalgia   . GERD (gastroesophageal reflux disease)   . Hx of colonoscopy   . Hyperlipemia   . Hypertension   . IBS (irritable bowel syndrome)   . Internal hemorrhoids without mention of complication 6/83/4196   Colonoscopy  . Lumbar disc disease   . Migraines   . Obesity   . Pancreatitis   . Peripheral neuropathy   . Reflux esophagitis 04/21/00   EGD  . Sleep apnea   . Small fiber neuropathy     Past Surgical History:  Procedure Laterality Date  . APPENDECTOMY  1961  . LAPAROSCOPIC OVARIAN  CYSTECTOMY  1960's  . SHOULDER SURGERY  2004   "frozen"; right  . TRIGGER FINGER RELEASE  1996   right thumb    Patient Active Problem List   Diagnosis Date Noted  . Sleep apnea   . Small fiber neuropathy   . Peripheral neuropathy   . Pancreatitis   . Obesity   . Migraines   . Lumbar disc disease   . IBS (irritable bowel syndrome)   . Hypertension   . Hyperlipemia   . Hx of colonoscopy   . GERD (gastroesophageal reflux disease)   . Degenerative joint disease   . Encounter for Medicare annual wellness exam 02/25/2015  . Insomnia 02/18/2015  . Precordial chest pain 01/12/2015  . Fatigue   . Diastolic dysfunction   . Ejection fraction   . Routine health maintenance 03/26/2011  . Carotid artery disease ([place])   . Drug therapy   . PEDAL EDEMA 03/17/2010  . INSOMNIA, CHRONIC 04/17/2009  . OSTEOARTHROSIS UNSPEC WHETHER GEN/LOC ANK&FOOT 01/05/2009  . Depression 05/22/2008  . TINNITUS 08/30/2007  . Hyperlipidemia 04/05/2007  . PERIPHERAL NEUROPATHY 04/05/2007  . Essential hypertension 04/05/2007  . GASTROESOPHAGEAL REFLUX DISEASE 04/05/2007  . DEGENERATIVE JOINT DISEASE, CERVICAL SPINE 04/05/2007  . [place] DISEASE, LUMBAR 04/05/2007  . Fibromyalgia 04/05/2007  . MIGRAINES, HX OF 04/05/2007  .  APPENDECTOMY, HX OF 04/05/2007  . Reflux esophagitis 04/21/2000      Current Outpatient Medications  Medication Sig Dispense Refill  . acetaminophen (TYLENOL) 500 MG tablet Take 1,000 mg by mouth every 8 (eight) hours as needed for mild pain or moderate pain.    Marland Kitchen aspirin 81 MG tablet Take 81 mg by mouth daily.     . citalopram (CELEXA) 20 MG tablet Take 1/2  tablet by mouth  daily (Patient taking differently: Take 10 mg by mouth every other day. Take 1/2  tablet by mouth every other day) 45 tablet 3  . gabapentin (NEURONTIN) 300 MG capsule Take 1 capsule by mouth  twice daily 180 capsule 3  . hydrochlorothiazide (HYDRODIURIL) 25 MG tablet Take 1 tablet (25 mg total) by mouth daily. 90  tablet 3  . HYDROcodone-acetaminophen (NORCO) 10-325 MG per tablet Take 1 tablet by mouth every 6 (six) hours as needed. For pain 60 tablet 2  . losartan (COZAAR) 100 MG tablet Take 1 tablet (100 mg total) by mouth daily. 90 tablet 3  . methocarbamol (ROBAXIN) 500 MG tablet Take 500 mg by mouth 4 (four) times daily as needed for muscle spasms.    Marland Kitchen omeprazole (PRILOSEC) 40 MG capsule Take 1 capsule (40 mg total) by mouth daily. 90 capsule 0  . ondansetron (ZOFRAN-ODT) 4 MG disintegrating tablet Take 4 mg by mouth 2 (two) times daily as needed for nausea/vomiting.    . simvastatin (ZOCOR) 20 MG tablet Take one tablet by mouth in the evenings three times weekly (Mon, Wed, Fri). 36 tablet 3   No current facility-administered medications for this visit.     Allergies:   Mobic [meloxicam]; Codeine; Nitrofuran derivatives; Temazepam; Celebrex [celecoxib]; and Influenza vac split [flu virus vaccine]    Social History:  The patient  reports that she quit smoking about 20 years ago. Her smoking use included cigarettes. She has a 15.00 pack-year smoking history. she has never used smokeless tobacco. She reports that she does not drink alcohol or use drugs.   Family History:  The patient's family history includes Breast cancer in her paternal aunt; Coronary artery disease in her father; Dementia in her mother; Heart attack in her father; Heart disease in her father and mother; Hypertension in her brother; Obesity in her brother; Stroke in her mother.    ROS:  Please see the history of present illness.     Patient denies fever, chills, headache, sweats, rash, change in vision, change in hearing, chest pain, cough, nausea or vomiting, urinary symptoms. All other systems are reviewed and are negative.   PHYSICAL EXAM: VS:  BP 132/86   Pulse 71   Ht 5\' 2"  (1.575 m)   Wt 173 lb 12.8 oz (78.8 kg)   BMI 31.79 kg/m  , Affect appropriate Healthy:  appears stated age 33: normal Neck supple with no  adenopathy JVP normal no bruits no thyromegaly Lungs clear with no wheezing and good diaphragmatic motion Heart:  S1/S2 no murmur, no rub, gallop or click PMI normal Abdomen: benighn, BS positve, no tenderness, no AAA no bruit.  No HSM or HJR Distal pulses intact with no bruits No edema Neuro non-focal Skin warm and dry No muscular weakness   EKG:   05/20/16 NSR normal ECG 06/09/17 SR rate 71 normal    Recent Labs: 07/10/2016: ALT 22; BUN 18; Creatinine, Ser 0.78; Hemoglobin 13.1; Platelets 302; Potassium 3.3; Sodium 135    Lipid Panel    Component Value Date/Time  CHOL 176 02/23/2015 0844   TRIG 62.0 02/23/2015 0844   TRIG 63 06/19/2006 0922   HDL 67.00 02/23/2015 0844   CHOLHDL 3 02/23/2015 0844   VLDL 12.4 02/23/2015 0844   LDLCALC 96 02/23/2015 0844      Wt Readings from Last 3 Encounters:  06/09/17 173 lb 12.8 oz (78.8 kg)  05/20/16 165 lb (74.8 kg)  02/25/15 154 lb 12 oz (70.2 kg)      Current medicines are reviewed  The patient understands her medicines.     ASSESSMENT AND PLAN: Chest Pain: resolved normal myovue 2016 HTN:  Well controlled.  Continue current medications and low sodium Dash type diet.   Carotid: plaque no stenosis f/u duplex 01/2018 ASA  Chol:   Cholesterol is at goal.  Continue current dose of statin and diet Rx.  No myalgias or side effects.  F/U  LFT's in 6 months. Lab Results  Component Value Date   LDLCALC 96 02/23/2015   Carotid: she inquired about repeat duplex No bruit and 01/2016 plaque no stenosis no  Need to repeat          [name]

## 2017-06-09 ENCOUNTER — Encounter: Payer: Self-pay | Admitting: Cardiovascular Disease

## 2017-06-09 ENCOUNTER — Ambulatory Visit (INDEPENDENT_AMBULATORY_CARE_PROVIDER_SITE_OTHER): Payer: Medicare Other | Admitting: Cardiovascular Disease

## 2017-06-09 VITALS — BP 132/86 | HR 71 | Ht 62.0 in | Wt 173.8 lb

## 2017-06-09 DIAGNOSIS — I10 Essential (primary) hypertension: Secondary | ICD-10-CM | POA: Diagnosis not present

## 2017-06-09 NOTE — Patient Instructions (Addendum)

## 2017-06-21 DIAGNOSIS — R05 Cough: Secondary | ICD-10-CM | POA: Diagnosis not present

## 2017-06-21 DIAGNOSIS — B349 Viral infection, unspecified: Secondary | ICD-10-CM | POA: Diagnosis not present

## 2017-07-07 DIAGNOSIS — I10 Essential (primary) hypertension: Secondary | ICD-10-CM | POA: Diagnosis not present

## 2017-07-07 DIAGNOSIS — E785 Hyperlipidemia, unspecified: Secondary | ICD-10-CM | POA: Diagnosis not present

## 2017-07-07 DIAGNOSIS — F324 Major depressive disorder, single episode, in partial remission: Secondary | ICD-10-CM | POA: Diagnosis not present

## 2017-07-07 DIAGNOSIS — F329 Major depressive disorder, single episode, unspecified: Secondary | ICD-10-CM | POA: Diagnosis not present

## 2017-08-08 DIAGNOSIS — M1711 Unilateral primary osteoarthritis, right knee: Secondary | ICD-10-CM | POA: Diagnosis not present

## 2017-08-08 DIAGNOSIS — M170 Bilateral primary osteoarthritis of knee: Secondary | ICD-10-CM | POA: Diagnosis not present

## 2017-09-18 DIAGNOSIS — R69 Illness, unspecified: Secondary | ICD-10-CM | POA: Diagnosis not present

## 2017-09-26 DIAGNOSIS — J209 Acute bronchitis, unspecified: Secondary | ICD-10-CM | POA: Diagnosis not present

## 2017-10-17 DIAGNOSIS — M1711 Unilateral primary osteoarthritis, right knee: Secondary | ICD-10-CM | POA: Diagnosis not present

## 2017-10-24 DIAGNOSIS — M1711 Unilateral primary osteoarthritis, right knee: Secondary | ICD-10-CM | POA: Diagnosis not present

## 2017-11-02 DIAGNOSIS — M1711 Unilateral primary osteoarthritis, right knee: Secondary | ICD-10-CM | POA: Diagnosis not present

## 2017-11-16 DIAGNOSIS — J209 Acute bronchitis, unspecified: Secondary | ICD-10-CM | POA: Diagnosis not present

## 2017-11-16 DIAGNOSIS — R69 Illness, unspecified: Secondary | ICD-10-CM | POA: Diagnosis not present

## 2017-11-16 DIAGNOSIS — G603 Idiopathic progressive neuropathy: Secondary | ICD-10-CM | POA: Diagnosis not present

## 2017-11-16 DIAGNOSIS — M7062 Trochanteric bursitis, left hip: Secondary | ICD-10-CM | POA: Diagnosis not present

## 2017-11-16 DIAGNOSIS — G4700 Insomnia, unspecified: Secondary | ICD-10-CM | POA: Diagnosis not present

## 2017-11-16 DIAGNOSIS — K219 Gastro-esophageal reflux disease without esophagitis: Secondary | ICD-10-CM | POA: Diagnosis not present

## 2017-11-16 DIAGNOSIS — I10 Essential (primary) hypertension: Secondary | ICD-10-CM | POA: Diagnosis not present

## 2017-11-20 DIAGNOSIS — R69 Illness, unspecified: Secondary | ICD-10-CM | POA: Diagnosis not present

## 2017-12-12 DIAGNOSIS — M545 Low back pain: Secondary | ICD-10-CM | POA: Diagnosis not present

## 2017-12-15 DIAGNOSIS — M25562 Pain in left knee: Secondary | ICD-10-CM | POA: Diagnosis not present

## 2017-12-15 DIAGNOSIS — M1711 Unilateral primary osteoarthritis, right knee: Secondary | ICD-10-CM | POA: Diagnosis not present

## 2017-12-22 DIAGNOSIS — K219 Gastro-esophageal reflux disease without esophagitis: Secondary | ICD-10-CM | POA: Diagnosis not present

## 2017-12-22 DIAGNOSIS — K5732 Diverticulitis of large intestine without perforation or abscess without bleeding: Secondary | ICD-10-CM | POA: Diagnosis not present

## 2018-01-16 DIAGNOSIS — R109 Unspecified abdominal pain: Secondary | ICD-10-CM | POA: Diagnosis not present

## 2018-01-16 DIAGNOSIS — Z1211 Encounter for screening for malignant neoplasm of colon: Secondary | ICD-10-CM | POA: Diagnosis not present

## 2018-01-16 DIAGNOSIS — K5900 Constipation, unspecified: Secondary | ICD-10-CM | POA: Diagnosis not present

## 2018-02-14 DIAGNOSIS — Z1231 Encounter for screening mammogram for malignant neoplasm of breast: Secondary | ICD-10-CM | POA: Diagnosis not present

## 2018-02-22 DIAGNOSIS — N6041 Mammary duct ectasia of right breast: Secondary | ICD-10-CM | POA: Diagnosis not present

## 2018-02-28 ENCOUNTER — Ambulatory Visit
Admission: RE | Admit: 2018-02-28 | Discharge: 2018-02-28 | Disposition: A | Discharge status: Home / Self Care | Payer: Medicare Other | Source: Ambulatory Visit | Attending: Internal Medicine | Admitting: Internal Medicine

## 2018-02-28 ENCOUNTER — Other Ambulatory Visit: Payer: Self-pay | Admitting: Internal Medicine

## 2018-02-28 DIAGNOSIS — S299XXA Unspecified injury of thorax, initial encounter: Secondary | ICD-10-CM | POA: Diagnosis not present

## 2018-02-28 DIAGNOSIS — R0781 Pleurodynia: Secondary | ICD-10-CM

## 2018-02-28 DIAGNOSIS — R079 Chest pain, unspecified: Secondary | ICD-10-CM | POA: Diagnosis not present

## 2018-03-19 DIAGNOSIS — Z1211 Encounter for screening for malignant neoplasm of colon: Secondary | ICD-10-CM | POA: Diagnosis not present

## 2018-03-22 DIAGNOSIS — R69 Illness, unspecified: Secondary | ICD-10-CM | POA: Diagnosis not present

## 2018-03-26 DIAGNOSIS — R32 Unspecified urinary incontinence: Secondary | ICD-10-CM | POA: Diagnosis not present

## 2018-03-26 DIAGNOSIS — G8929 Other chronic pain: Secondary | ICD-10-CM | POA: Diagnosis not present

## 2018-03-26 DIAGNOSIS — E785 Hyperlipidemia, unspecified: Secondary | ICD-10-CM | POA: Diagnosis not present

## 2018-03-26 DIAGNOSIS — K219 Gastro-esophageal reflux disease without esophagitis: Secondary | ICD-10-CM | POA: Diagnosis not present

## 2018-03-26 DIAGNOSIS — Z791 Long term (current) use of non-steroidal anti-inflammatories (NSAID): Secondary | ICD-10-CM | POA: Diagnosis not present

## 2018-03-26 DIAGNOSIS — G629 Polyneuropathy, unspecified: Secondary | ICD-10-CM | POA: Diagnosis not present

## 2018-03-26 DIAGNOSIS — G4700 Insomnia, unspecified: Secondary | ICD-10-CM | POA: Diagnosis not present

## 2018-03-26 DIAGNOSIS — Z7982 Long term (current) use of aspirin: Secondary | ICD-10-CM | POA: Diagnosis not present

## 2018-03-26 DIAGNOSIS — R69 Illness, unspecified: Secondary | ICD-10-CM | POA: Diagnosis not present

## 2018-03-26 DIAGNOSIS — I10 Essential (primary) hypertension: Secondary | ICD-10-CM | POA: Diagnosis not present

## 2018-04-26 ENCOUNTER — Telehealth: Payer: Self-pay | Admitting: Cardiovascular Disease

## 2018-04-26 NOTE — Telephone Encounter (Signed)
Follow Up   Pt returning call for nurse about carotid

## 2018-04-26 NOTE — Telephone Encounter (Signed)
Left message for patient to call back  

## 2018-04-26 NOTE — Telephone Encounter (Signed)
New message   Patient would like to have a carotid artery test scheduled. Please call to discuss.

## 2018-04-27 NOTE — Telephone Encounter (Signed)
Left message for patient to call back  

## 2018-05-08 NOTE — Telephone Encounter (Signed)
Called patient about her message. Patient stated she is suppose to have a carotid duplex every 2 years. Patient is due for a year f/u with Dr. Johnsie Cancel. Made patient an appointment for office visit in December. Will see if patient can get carotid duplex before her appointment in December. Will forward to Dr. Johnsie Cancel.

## 2018-06-14 DIAGNOSIS — L814 Other melanin hyperpigmentation: Secondary | ICD-10-CM | POA: Diagnosis not present

## 2018-06-14 DIAGNOSIS — D485 Neoplasm of uncertain behavior of skin: Secondary | ICD-10-CM | POA: Diagnosis not present

## 2018-06-14 DIAGNOSIS — D1801 Hemangioma of skin and subcutaneous tissue: Secondary | ICD-10-CM | POA: Diagnosis not present

## 2018-06-14 DIAGNOSIS — L430 Hypertrophic lichen planus: Secondary | ICD-10-CM | POA: Diagnosis not present

## 2018-06-14 DIAGNOSIS — L570 Actinic keratosis: Secondary | ICD-10-CM | POA: Diagnosis not present

## 2018-06-19 DIAGNOSIS — I6529 Occlusion and stenosis of unspecified carotid artery: Secondary | ICD-10-CM | POA: Diagnosis not present

## 2018-06-19 DIAGNOSIS — K219 Gastro-esophageal reflux disease without esophagitis: Secondary | ICD-10-CM | POA: Diagnosis not present

## 2018-06-19 DIAGNOSIS — Z0001 Encounter for general adult medical examination with abnormal findings: Secondary | ICD-10-CM | POA: Diagnosis not present

## 2018-06-19 DIAGNOSIS — I10 Essential (primary) hypertension: Secondary | ICD-10-CM | POA: Diagnosis not present

## 2018-06-19 DIAGNOSIS — E559 Vitamin D deficiency, unspecified: Secondary | ICD-10-CM | POA: Diagnosis not present

## 2018-06-19 DIAGNOSIS — G4700 Insomnia, unspecified: Secondary | ICD-10-CM | POA: Diagnosis not present

## 2018-06-19 DIAGNOSIS — Z1389 Encounter for screening for other disorder: Secondary | ICD-10-CM | POA: Diagnosis not present

## 2018-06-19 DIAGNOSIS — G603 Idiopathic progressive neuropathy: Secondary | ICD-10-CM | POA: Diagnosis not present

## 2018-06-19 DIAGNOSIS — E669 Obesity, unspecified: Secondary | ICD-10-CM | POA: Diagnosis not present

## 2018-06-19 DIAGNOSIS — R69 Illness, unspecified: Secondary | ICD-10-CM | POA: Diagnosis not present

## 2018-06-27 NOTE — Progress Notes (Deleted)
Cardiology Office Note   Date:  06/27/2018   ID:  [name], DOB October 19, 1945, MRN [id number]  PCP:  Catherine Huddle, MD  Cardiologist:  Catherine Rouge, MD   No chief complaint on file.     History of Present Illness: [name] is a 72 y.o. female who presents today to follow-up Previous patient of Dr [name] She does not have documented coronary disease. She had some chest discomfort 2016  and was admitted for evaluation. During the hospitalization the nuclear stress study was normal. She was discharged home. It is clear now that her symptoms were related to GERD at that time.    Compliant with BP meds  Has carotid dx with plaque no stenosis by duplex 01/21/16 new criteria needs repeat study over 2 years since done  On zocor for HLD primary follows labs  ***    Past Medical History:  Diagnosis Date  . Carotid artery disease ([place])    Doppler, April, 2012,  7-42% R. ICA, 59-56% LICA... no significant change... frequency of Doppler moved to yearly because of stability  . Chest pain    Nuclear stress test 2006 normal  . Degenerative joint disease    cervical spine  . Depression   . Diastolic dysfunction    Echo report, June, 2013  . Diverticulosis of colon (without mention of hemorrhage) 10/03/2007   Colonoscopy  . Drug therapy    msucle aches with simvastatin, but tolerates 5mg  Creastor  . Ejection fraction    EF 60%, echo, December 20, 2011  . Fatigue    June, 2013  . Fibromyalgia   . GERD (gastroesophageal reflux disease)   . Hx of colonoscopy   . Hyperlipemia   . Hypertension   . IBS (irritable bowel syndrome)   . Internal hemorrhoids without mention of complication 3/87/5643   Colonoscopy  . Lumbar disc disease   . Migraines   . Obesity   . Pancreatitis   . Peripheral neuropathy   . Reflux esophagitis 04/21/00   EGD  . Sleep apnea   . Small fiber neuropathy     Past Surgical History:  Procedure Laterality Date  . APPENDECTOMY  1961  .  LAPAROSCOPIC OVARIAN CYSTECTOMY  1960's  . SHOULDER SURGERY  2004   "frozen"; right  . TRIGGER FINGER RELEASE  1996   right thumb    Patient Active Problem List   Diagnosis Date Noted  . Sleep apnea   . Small fiber neuropathy   . Peripheral neuropathy   . Pancreatitis   . Obesity   . Migraines   . Lumbar disc disease   . IBS (irritable bowel syndrome)   . Hypertension   . Hyperlipemia   . Hx of colonoscopy   . GERD (gastroesophageal reflux disease)   . Degenerative joint disease   . Encounter for Medicare annual wellness exam 02/25/2015  . Insomnia 02/18/2015  . Precordial chest pain 01/12/2015  . Fatigue   . Diastolic dysfunction   . Ejection fraction   . Routine health maintenance 03/26/2011  . Carotid artery disease ([place])   . Drug therapy   . PEDAL EDEMA 03/17/2010  . INSOMNIA, CHRONIC 04/17/2009  . OSTEOARTHROSIS UNSPEC WHETHER GEN/LOC ANK&FOOT 01/05/2009  . Depression 05/22/2008  . TINNITUS 08/30/2007  . Hyperlipidemia 04/05/2007  . PERIPHERAL NEUROPATHY 04/05/2007  . Essential hypertension 04/05/2007  . GASTROESOPHAGEAL REFLUX DISEASE 04/05/2007  . DEGENERATIVE JOINT DISEASE, CERVICAL SPINE 04/05/2007  . [place] DISEASE, LUMBAR 04/05/2007  . Fibromyalgia 04/05/2007  .  MIGRAINES, HX OF 04/05/2007  . APPENDECTOMY, HX OF 04/05/2007  . Reflux esophagitis 04/21/2000      Current Outpatient Medications  Medication Sig Dispense Refill  . acetaminophen (TYLENOL) 500 MG tablet Take 1,000 mg by mouth every 8 (eight) hours as needed for mild pain or moderate pain.    Marland Kitchen aspirin 81 MG tablet Take 81 mg by mouth daily.     . citalopram (CELEXA) 20 MG tablet Take 1/2  tablet by mouth  daily (Patient taking differently: Take 10 mg by mouth every other day. Take 1/2  tablet by mouth every other day) 45 tablet 3  . gabapentin (NEURONTIN) 300 MG capsule Take 1 capsule by mouth  twice daily 180 capsule 3  . hydrochlorothiazide (HYDRODIURIL) 25 MG tablet Take 1 tablet (25 mg  total) by mouth daily. 90 tablet 3  . HYDROcodone-acetaminophen (NORCO) 10-325 MG per tablet Take 1 tablet by mouth every 6 (six) hours as needed. For pain 60 tablet 2  . losartan (COZAAR) 100 MG tablet Take 1 tablet (100 mg total) by mouth daily. 90 tablet 3  . methocarbamol (ROBAXIN) 500 MG tablet Take 500 mg by mouth 4 (four) times daily as needed for muscle spasms.    Marland Kitchen omeprazole (PRILOSEC) 40 MG capsule Take 1 capsule (40 mg total) by mouth daily. 90 capsule 0  . ondansetron (ZOFRAN-ODT) 4 MG disintegrating tablet Take 4 mg by mouth 2 (two) times daily as needed for nausea/vomiting.    . simvastatin (ZOCOR) 20 MG tablet Take one tablet by mouth in the evenings three times weekly (Mon, Wed, Fri). 36 tablet 3   No current facility-administered medications for this visit.     Allergies:   Mobic [meloxicam]; Codeine; Nitrofuran derivatives; Temazepam; Celebrex [celecoxib]; and Influenza vac split [flu virus vaccine]    Social History:  The patient  reports that she quit smoking about 21 years ago. Her smoking use included cigarettes. She has a 15.00 pack-year smoking history. She has never used smokeless tobacco. She reports that she does not drink alcohol or use drugs.   Family History:  The patient's family history includes Breast cancer in her paternal aunt; Coronary artery disease in her father; Dementia in her mother; Heart attack in her father; Heart disease in her father and mother; Hypertension in her brother; Obesity in her brother; Stroke in her mother.    ROS:  Please see the history of present illness.     Patient denies fever, chills, headache, sweats, rash, change in vision, change in hearing, chest pain, cough, nausea or vomiting, urinary symptoms. All other systems are reviewed and are negative.   PHYSICAL EXAM: VS:  There were no vitals taken for this visit. , Affect appropriate Healthy:  appears stated age 18: normal Neck supple with no adenopathy JVP normal no  bruits no thyromegaly Lungs clear with no wheezing and good diaphragmatic motion Heart:  S1/S2 no murmur, no rub, gallop or click PMI normal Abdomen: benighn, BS positve, no tenderness, no AAA no bruit.  No HSM or HJR Distal pulses intact with no bruits No edema Neuro non-focal Skin warm and dry No muscular weakness    EKG:   05/20/16 NSR normal ECG 06/09/17 SR rate 71 normal    Recent Labs: No results found for requested labs within last 8760 hours.    Lipid Panel    Component Value Date/Time   CHOL 176 02/23/2015 0844   TRIG 62.0 02/23/2015 0844   TRIG 63 06/19/2006 0922  HDL 67.00 02/23/2015 0844   CHOLHDL 3 02/23/2015 0844   VLDL 12.4 02/23/2015 0844   LDLCALC 96 02/23/2015 0844      Wt Readings from Last 3 Encounters:  06/09/17 173 lb 12.8 oz (78.8 kg)  05/20/16 165 lb (74.8 kg)  02/25/15 154 lb 12 oz (70.2 kg)      Current medicines are reviewed  The patient understands her medicines.     ASSESSMENT AND PLAN: Chest Pain: resolved normal myovue 2016 HTN:  Well controlled.  Continue current medications and low sodium Dash type diet.   Carotid: plaque no stenosis f/u duplex ordered continue 81 mg ASA  HLD:  On statin labs with primary           [name]

## 2018-06-29 ENCOUNTER — Ambulatory Visit: Payer: Medicare HMO | Admitting: Cardiovascular Disease

## 2018-07-02 ENCOUNTER — Encounter: Payer: Self-pay | Admitting: Cardiovascular Disease

## 2018-07-06 NOTE — Progress Notes (Signed)
Cardiology Office Note   Date:  07/10/2018   ID:  [name], DOB March 23, 1946, MRN [id number]  PCP:  Josetta Huddle, MD  Cardiologist:  Jenkins Rouge, MD   No chief complaint on file.     History of Present Illness: [name] is a 72 y.o. female who presents today to follow-up Previous patient of Dr [name] She does not have documented coronary disease. She had some chest discomfort 2016  and was admitted for evaluation. During the hospitalization the nuclear stress study was normal. She was discharged home. It is clear now that her symptoms were related to GERD at that time.    Compliant with BP meds  Has carotid dx with plaque no stenosis by duplex 01/21/16 new criteria needs repeat study over 2 years since done  On zocor for HLD primary follows labs  Has had congestion with green phlegm for a month Has not seen her primary yet    Past Medical History:  Diagnosis Date  . Carotid artery disease ([place])    Doppler, April, 2012,  9-56% R. ICA, 21-30% LICA... no significant change... frequency of Doppler moved to yearly because of stability  . Chest pain    Nuclear stress test 2006 normal  . Degenerative joint disease    cervical spine  . Depression   . Diastolic dysfunction    Echo report, June, 2013  . Diverticulosis of colon (without mention of hemorrhage) 10/03/2007   Colonoscopy  . Drug therapy    msucle aches with simvastatin, but tolerates 5mg  Creastor  . Ejection fraction    EF 60%, echo, December 20, 2011  . Fatigue    June, 2013  . Fibromyalgia   . GERD (gastroesophageal reflux disease)   . Hx of colonoscopy   . Hyperlipemia   . Hypertension   . IBS (irritable bowel syndrome)   . Internal hemorrhoids without mention of complication 8/65/7846   Colonoscopy  . Lumbar disc disease   . Migraines   . Obesity   . Pancreatitis   . Peripheral neuropathy   . Reflux esophagitis 04/21/00   EGD  . Sleep apnea   . Small fiber neuropathy     Past Surgical  History:  Procedure Laterality Date  . APPENDECTOMY  1961  . LAPAROSCOPIC OVARIAN CYSTECTOMY  1960's  . SHOULDER SURGERY  2004   "frozen"; right  . TRIGGER FINGER RELEASE  1996   right thumb    Patient Active Problem List   Diagnosis Date Noted  . Sleep apnea   . Small fiber neuropathy   . Peripheral neuropathy   . Pancreatitis   . Obesity   . Migraines   . Lumbar disc disease   . IBS (irritable bowel syndrome)   . Hypertension   . Hyperlipemia   . Hx of colonoscopy   . GERD (gastroesophageal reflux disease)   . Degenerative joint disease   . Encounter for Medicare annual wellness exam 02/25/2015  . Insomnia 02/18/2015  . Precordial chest pain 01/12/2015  . Fatigue   . Diastolic dysfunction   . Ejection fraction   . Routine health maintenance 03/26/2011  . Carotid artery disease ([place])   . Drug therapy   . PEDAL EDEMA 03/17/2010  . INSOMNIA, CHRONIC 04/17/2009  . OSTEOARTHROSIS UNSPEC WHETHER GEN/LOC ANK&FOOT 01/05/2009  . Depression 05/22/2008  . TINNITUS 08/30/2007  . Hyperlipidemia 04/05/2007  . PERIPHERAL NEUROPATHY 04/05/2007  . Essential hypertension 04/05/2007  . GASTROESOPHAGEAL REFLUX DISEASE 04/05/2007  . DEGENERATIVE JOINT DISEASE,  CERVICAL SPINE 04/05/2007  . [place] DISEASE, LUMBAR 04/05/2007  . Fibromyalgia 04/05/2007  . MIGRAINES, HX OF 04/05/2007  . APPENDECTOMY, HX OF 04/05/2007  . Reflux esophagitis 04/21/2000      Current Outpatient Medications  Medication Sig Dispense Refill  . acetaminophen (TYLENOL) 500 MG tablet Take 1,000 mg by mouth every 8 (eight) hours as needed for mild pain or moderate pain.    Marland Kitchen aspirin 81 MG tablet Take 81 mg by mouth daily.     . citalopram (CELEXA) 10 MG tablet Take 10 mg by mouth 3 (three) times a week. Patient takes 10mg  on Monday, Wednesday and Friday.    . gabapentin (NEURONTIN) 300 MG capsule Take 1 capsule by mouth  twice daily 180 capsule 3  . hydrochlorothiazide (HYDRODIURIL) 25 MG tablet Take 1 tablet (25  mg total) by mouth daily. 90 tablet 3  . HYDROcodone-acetaminophen (NORCO) 10-325 MG per tablet Take 1 tablet by mouth every 6 (six) hours as needed. For pain 60 tablet 2  . losartan (COZAAR) 100 MG tablet Take 1 tablet (100 mg total) by mouth daily. 90 tablet 3  . methocarbamol (ROBAXIN) 500 MG tablet Take 500 mg by mouth 4 (four) times daily as needed for muscle spasms.    Marland Kitchen omeprazole (PRILOSEC) 40 MG capsule Take 1 capsule (40 mg total) by mouth daily. 90 capsule 0  . simvastatin (ZOCOR) 20 MG tablet Take one tablet by mouth in the evenings three times weekly (Mon, Wed, Fri). 36 tablet 3   No current facility-administered medications for this visit.     Allergies:   Mobic [meloxicam]; Codeine; Nitrofuran derivatives; Temazepam; Celebrex [celecoxib]; and Influenza vac split [flu virus vaccine]    Social History:  The patient  reports that she quit smoking about 22 years ago. Her smoking use included cigarettes. She has a 15.00 pack-year smoking history. She has never used smokeless tobacco. She reports that she does not drink alcohol or use drugs.   Family History:  The patient's family history includes Breast cancer in her paternal aunt; Coronary artery disease in her father; Dementia in her mother; Heart attack in her father; Heart disease in her father and mother; Hypertension in her brother; Obesity in her brother; Stroke in her mother.    ROS:  Please see the history of present illness.     Patient denies fever, chills, headache, sweats, rash, change in vision, change in hearing, chest pain, cough, nausea or vomiting, urinary symptoms. All other systems are reviewed and are negative.   PHYSICAL EXAM: VS:  BP 140/76   Pulse 69   Ht 5\' 2"  (1.575 m)   Wt 167 lb (75.8 kg)   BMI 30.54 kg/m  , Affect appropriate Healthy:  appears stated age 10: normal Neck supple with no adenopathy JVP normal no bruits no thyromegaly Lungs RUL wheezing  S1/S2 no murmur, no rub, gallop or  click PMI normal Abdomen: benighn, BS positve, no tenderness, no AAA no bruit.  No HSM or HJR Distal pulses intact with no bruits No edema Neuro non-focal Skin warm and dry No muscular weakness    EKG:   05/20/16 NSR normal ECG 06/09/17 SR rate 71 normal  07/10/18 SR rate 69 normal    Recent Labs: No results found for requested labs within last 8760 hours.    Lipid Panel    Component Value Date/Time   CHOL 176 02/23/2015 0844   TRIG 62.0 02/23/2015 0844   TRIG 63 06/19/2006 0922   HDL 67.00  02/23/2015 0844   CHOLHDL 3 02/23/2015 0844   VLDL 12.4 02/23/2015 0844   LDLCALC 96 02/23/2015 0844      Wt Readings from Last 3 Encounters:  07/10/18 167 lb (75.8 kg)  06/09/17 173 lb 12.8 oz (78.8 kg)  05/20/16 165 lb (74.8 kg)      Current medicines are reviewed  The patient understands her medicines.     ASSESSMENT AND PLAN:  Chest Pain: resolved normal myovue 2016 HTN:  Well controlled.  Continue current medications and low sodium Dash type diet.   Carotid: plaque no stenosis f/u duplex ordered continue 81 mg ASA  HLD:  On statin labs with primary  URI:  Abnormal exam RUL will give script for Z pack and check CXR f/u with primary          F/U in a year  Carotid Duplex ordered  CXR and Z pack   Jenkins Rouge

## 2018-07-10 ENCOUNTER — Ambulatory Visit
Admission: RE | Admit: 2018-07-10 | Discharge: 2018-07-10 | Disposition: A | Discharge status: Home / Self Care | Payer: Medicare HMO | Source: Ambulatory Visit | Attending: Cardiovascular Disease | Admitting: Cardiovascular Disease

## 2018-07-10 ENCOUNTER — Ambulatory Visit: Payer: Medicare HMO | Admitting: Cardiovascular Disease

## 2018-07-10 ENCOUNTER — Encounter: Payer: Self-pay | Admitting: Cardiovascular Disease

## 2018-07-10 VITALS — BP 140/76 | HR 69 | Ht 62.0 in | Wt 167.0 lb

## 2018-07-10 DIAGNOSIS — I779 Disorder of arteries and arterioles, unspecified: Secondary | ICD-10-CM

## 2018-07-10 DIAGNOSIS — R062 Wheezing: Secondary | ICD-10-CM

## 2018-07-10 DIAGNOSIS — I739 Peripheral vascular disease, unspecified: Secondary | ICD-10-CM | POA: Diagnosis not present

## 2018-07-10 DIAGNOSIS — I10 Essential (primary) hypertension: Secondary | ICD-10-CM | POA: Diagnosis not present

## 2018-07-10 DIAGNOSIS — R05 Cough: Secondary | ICD-10-CM | POA: Diagnosis not present

## 2018-07-10 MED ORDER — AZITHROMYCIN 250 MG PO TABS: ORAL_TABLET | ORAL | 0 refills | Status: DC

## 2018-07-10 NOTE — Patient Instructions (Addendum)
Medication Instructions:  Your physician has recommended you make the following change in your medication:  1-START taking Azithromycin 250 mg  2 tablets by mouth the 1 st day and then take 1 tablet by mouth daily for 4 days.  If you need a refill on your cardiac medications before your next appointment, please call your pharmacy.   Lab work:  If you have labs (blood work) drawn today and your tests are completely normal, you will receive your results only by: Marland Kitchen MyChart Message (if you have MyChart) OR . A paper copy in the mail If you have any lab test that is abnormal or we need to change your treatment, we will call you to review the results.  Testing/Procedures: A chest x-ray takes a picture of the organs and structures inside the chest, including the heart, lungs, and blood vessels. This test can show several things, including, whether the heart is enlarges; whether fluid is building up in the lungs; and whether pacemaker / defibrillator leads are still in place.  Your physician has requested that you have a carotid duplex. This test is an ultrasound of the carotid arteries in your neck. It looks at blood flow through these arteries that supply the brain with blood. Allow one hour for this exam. There are no restrictions or special instructions.  Follow-Up: At [facility], you and your health needs are our priority.  As part of our continuing mission to provide you with exceptional heart care, we have created designated Provider Care Teams.  These Care Teams include your primary Cardiologist (physician) and Advanced Practice Providers (APPs -  Physician Assistants and Nurse Practitioners) who all work together to provide you with the care you need, when you need it. You will need a follow up appointment in 12 months.  Please call our office 2 months in advance to schedule this appointment.  You may see Jenkins Rouge, MD or one of the following Advanced Practice Providers on your designated  Care Team:   Truitt Merle, NP Cecilie Kicks, NP . Kathyrn Drown, NP

## 2018-07-25 ENCOUNTER — Ambulatory Visit (HOSPITAL_COMMUNITY)
Admission: RE | Admit: 2018-07-25 | Discharge: 2018-07-25 | Disposition: A | Discharge status: Home / Self Care | Payer: Medicare HMO | Source: Ambulatory Visit | Attending: Cardiology | Admitting: Cardiology

## 2018-07-25 DIAGNOSIS — I739 Peripheral vascular disease, unspecified: Secondary | ICD-10-CM | POA: Insufficient documentation

## 2018-07-25 DIAGNOSIS — I779 Disorder of arteries and arterioles, unspecified: Secondary | ICD-10-CM

## 2018-08-02 DIAGNOSIS — I10 Essential (primary) hypertension: Secondary | ICD-10-CM | POA: Diagnosis not present

## 2018-08-02 DIAGNOSIS — R69 Illness, unspecified: Secondary | ICD-10-CM | POA: Diagnosis not present

## 2018-08-02 DIAGNOSIS — E785 Hyperlipidemia, unspecified: Secondary | ICD-10-CM | POA: Diagnosis not present

## 2018-08-27 DIAGNOSIS — R109 Unspecified abdominal pain: Secondary | ICD-10-CM | POA: Diagnosis not present

## 2018-08-30 DIAGNOSIS — R109 Unspecified abdominal pain: Secondary | ICD-10-CM | POA: Diagnosis not present

## 2018-09-13 DIAGNOSIS — M545 Low back pain: Secondary | ICD-10-CM | POA: Diagnosis not present

## 2018-09-14 DIAGNOSIS — H04123 Dry eye syndrome of bilateral lacrimal glands: Secondary | ICD-10-CM | POA: Diagnosis not present

## 2018-10-05 DIAGNOSIS — M546 Pain in thoracic spine: Secondary | ICD-10-CM | POA: Diagnosis not present

## 2018-10-17 DIAGNOSIS — K219 Gastro-esophageal reflux disease without esophagitis: Secondary | ICD-10-CM | POA: Diagnosis not present

## 2018-10-17 DIAGNOSIS — I10 Essential (primary) hypertension: Secondary | ICD-10-CM | POA: Diagnosis not present

## 2018-10-17 DIAGNOSIS — M8580 Other specified disorders of bone density and structure, unspecified site: Secondary | ICD-10-CM | POA: Diagnosis not present

## 2018-10-17 DIAGNOSIS — M797 Fibromyalgia: Secondary | ICD-10-CM | POA: Diagnosis not present

## 2018-10-17 DIAGNOSIS — E785 Hyperlipidemia, unspecified: Secondary | ICD-10-CM | POA: Diagnosis not present

## 2018-10-17 DIAGNOSIS — G609 Hereditary and idiopathic neuropathy, unspecified: Secondary | ICD-10-CM | POA: Diagnosis not present

## 2018-10-17 DIAGNOSIS — K589 Irritable bowel syndrome without diarrhea: Secondary | ICD-10-CM | POA: Diagnosis not present

## 2018-10-17 DIAGNOSIS — G43909 Migraine, unspecified, not intractable, without status migrainosus: Secondary | ICD-10-CM | POA: Diagnosis not present

## 2018-10-17 DIAGNOSIS — G4730 Sleep apnea, unspecified: Secondary | ICD-10-CM | POA: Diagnosis not present

## 2018-10-17 DIAGNOSIS — M5416 Radiculopathy, lumbar region: Secondary | ICD-10-CM | POA: Diagnosis not present

## 2018-10-25 DIAGNOSIS — M546 Pain in thoracic spine: Secondary | ICD-10-CM | POA: Diagnosis not present

## 2018-10-29 ENCOUNTER — Ambulatory Visit: Payer: Medicare HMO | Admitting: Neurology

## 2018-10-30 ENCOUNTER — Ambulatory Visit: Payer: Medicare HMO | Admitting: Neurology

## 2018-10-30 ENCOUNTER — Telehealth: Payer: Self-pay | Admitting: Neurology

## 2018-10-30 ENCOUNTER — Other Ambulatory Visit: Payer: Self-pay

## 2018-10-30 NOTE — Telephone Encounter (Signed)
I spoke with the patient and she stated that she no longer needs a neurologist. I told her to call back if she changes her mind.

## 2018-10-31 DIAGNOSIS — E785 Hyperlipidemia, unspecified: Secondary | ICD-10-CM | POA: Diagnosis not present

## 2018-10-31 DIAGNOSIS — I10 Essential (primary) hypertension: Secondary | ICD-10-CM | POA: Diagnosis not present

## 2018-10-31 DIAGNOSIS — R69 Illness, unspecified: Secondary | ICD-10-CM | POA: Diagnosis not present

## 2019-01-10 DIAGNOSIS — R69 Illness, unspecified: Secondary | ICD-10-CM | POA: Diagnosis not present

## 2019-02-08 DIAGNOSIS — I10 Essential (primary) hypertension: Secondary | ICD-10-CM | POA: Diagnosis not present

## 2019-02-08 DIAGNOSIS — E559 Vitamin D deficiency, unspecified: Secondary | ICD-10-CM | POA: Diagnosis not present

## 2019-02-13 DIAGNOSIS — E538 Deficiency of other specified B group vitamins: Secondary | ICD-10-CM | POA: Diagnosis not present

## 2019-02-13 DIAGNOSIS — E785 Hyperlipidemia, unspecified: Secondary | ICD-10-CM | POA: Diagnosis not present

## 2019-02-13 DIAGNOSIS — G4730 Sleep apnea, unspecified: Secondary | ICD-10-CM | POA: Diagnosis not present

## 2019-02-13 DIAGNOSIS — G43909 Migraine, unspecified, not intractable, without status migrainosus: Secondary | ICD-10-CM | POA: Diagnosis not present

## 2019-02-13 DIAGNOSIS — K589 Irritable bowel syndrome without diarrhea: Secondary | ICD-10-CM | POA: Diagnosis not present

## 2019-02-13 DIAGNOSIS — M8580 Other specified disorders of bone density and structure, unspecified site: Secondary | ICD-10-CM | POA: Diagnosis not present

## 2019-02-13 DIAGNOSIS — M170 Bilateral primary osteoarthritis of knee: Secondary | ICD-10-CM | POA: Diagnosis not present

## 2019-02-13 DIAGNOSIS — E559 Vitamin D deficiency, unspecified: Secondary | ICD-10-CM | POA: Diagnosis not present

## 2019-02-13 DIAGNOSIS — Z1331 Encounter for screening for depression: Secondary | ICD-10-CM | POA: Diagnosis not present

## 2019-02-13 DIAGNOSIS — I700 Atherosclerosis of aorta: Secondary | ICD-10-CM | POA: Diagnosis not present

## 2019-02-13 DIAGNOSIS — E669 Obesity, unspecified: Secondary | ICD-10-CM | POA: Diagnosis not present

## 2019-02-18 DIAGNOSIS — Z1231 Encounter for screening mammogram for malignant neoplasm of breast: Secondary | ICD-10-CM | POA: Diagnosis not present

## 2019-03-25 DIAGNOSIS — M545 Low back pain: Secondary | ICD-10-CM | POA: Diagnosis not present

## 2019-04-01 DIAGNOSIS — M1712 Unilateral primary osteoarthritis, left knee: Secondary | ICD-10-CM | POA: Diagnosis not present

## 2019-04-01 DIAGNOSIS — M1711 Unilateral primary osteoarthritis, right knee: Secondary | ICD-10-CM | POA: Diagnosis not present

## 2019-04-01 DIAGNOSIS — M170 Bilateral primary osteoarthritis of knee: Secondary | ICD-10-CM | POA: Diagnosis not present

## 2019-04-05 DIAGNOSIS — R5383 Other fatigue: Secondary | ICD-10-CM | POA: Diagnosis not present

## 2019-04-05 DIAGNOSIS — E538 Deficiency of other specified B group vitamins: Secondary | ICD-10-CM | POA: Diagnosis not present

## 2019-04-18 DIAGNOSIS — N1831 Chronic kidney disease, stage 3a: Secondary | ICD-10-CM | POA: Diagnosis not present

## 2019-04-18 DIAGNOSIS — G4730 Sleep apnea, unspecified: Secondary | ICD-10-CM | POA: Diagnosis not present

## 2019-04-18 DIAGNOSIS — R69 Illness, unspecified: Secondary | ICD-10-CM | POA: Diagnosis not present

## 2019-04-18 DIAGNOSIS — M791 Myalgia, unspecified site: Secondary | ICD-10-CM | POA: Diagnosis not present

## 2019-04-18 DIAGNOSIS — B009 Herpesviral infection, unspecified: Secondary | ICD-10-CM | POA: Diagnosis not present

## 2019-04-18 DIAGNOSIS — I129 Hypertensive chronic kidney disease with stage 1 through stage 4 chronic kidney disease, or unspecified chronic kidney disease: Secondary | ICD-10-CM | POA: Diagnosis not present

## 2019-04-18 DIAGNOSIS — E559 Vitamin D deficiency, unspecified: Secondary | ICD-10-CM | POA: Diagnosis not present

## 2019-04-18 DIAGNOSIS — E538 Deficiency of other specified B group vitamins: Secondary | ICD-10-CM | POA: Diagnosis not present

## 2019-04-18 DIAGNOSIS — R0609 Other forms of dyspnea: Secondary | ICD-10-CM | POA: Diagnosis not present

## 2019-04-18 DIAGNOSIS — M797 Fibromyalgia: Secondary | ICD-10-CM | POA: Diagnosis not present

## 2019-04-18 DIAGNOSIS — I5189 Other ill-defined heart diseases: Secondary | ICD-10-CM | POA: Diagnosis not present

## 2019-04-29 ENCOUNTER — Other Ambulatory Visit (HOSPITAL_COMMUNITY): Payer: Self-pay | Admitting: Endocrinology

## 2019-04-29 DIAGNOSIS — R0600 Dyspnea, unspecified: Secondary | ICD-10-CM

## 2019-04-29 DIAGNOSIS — I700 Atherosclerosis of aorta: Secondary | ICD-10-CM

## 2019-04-29 DIAGNOSIS — R0609 Other forms of dyspnea: Secondary | ICD-10-CM

## 2019-05-02 ENCOUNTER — Ambulatory Visit (HOSPITAL_COMMUNITY): Payer: Medicare HMO | Attending: Cardiology

## 2019-05-02 ENCOUNTER — Other Ambulatory Visit: Payer: Self-pay

## 2019-05-02 DIAGNOSIS — I700 Atherosclerosis of aorta: Secondary | ICD-10-CM

## 2019-05-02 DIAGNOSIS — R0600 Dyspnea, unspecified: Secondary | ICD-10-CM | POA: Diagnosis not present

## 2019-05-02 DIAGNOSIS — R0609 Other forms of dyspnea: Secondary | ICD-10-CM

## 2019-05-08 DIAGNOSIS — M1711 Unilateral primary osteoarthritis, right knee: Secondary | ICD-10-CM | POA: Diagnosis not present

## 2019-05-15 DIAGNOSIS — M1711 Unilateral primary osteoarthritis, right knee: Secondary | ICD-10-CM | POA: Diagnosis not present

## 2019-05-16 DIAGNOSIS — M545 Low back pain: Secondary | ICD-10-CM | POA: Diagnosis not present

## 2019-05-22 DIAGNOSIS — M1711 Unilateral primary osteoarthritis, right knee: Secondary | ICD-10-CM | POA: Diagnosis not present

## 2019-05-28 DIAGNOSIS — M545 Low back pain: Secondary | ICD-10-CM | POA: Diagnosis not present

## 2019-06-03 DIAGNOSIS — M545 Low back pain: Secondary | ICD-10-CM | POA: Diagnosis not present

## 2019-06-17 DIAGNOSIS — R05 Cough: Secondary | ICD-10-CM | POA: Diagnosis not present

## 2019-06-17 DIAGNOSIS — I129 Hypertensive chronic kidney disease with stage 1 through stage 4 chronic kidney disease, or unspecified chronic kidney disease: Secondary | ICD-10-CM | POA: Diagnosis not present

## 2019-06-17 DIAGNOSIS — R509 Fever, unspecified: Secondary | ICD-10-CM | POA: Diagnosis not present

## 2019-06-17 DIAGNOSIS — N1831 Chronic kidney disease, stage 3a: Secondary | ICD-10-CM | POA: Diagnosis not present

## 2019-06-17 DIAGNOSIS — U071 COVID-19: Secondary | ICD-10-CM | POA: Diagnosis not present

## 2019-06-17 DIAGNOSIS — Z20828 Contact with and (suspected) exposure to other viral communicable diseases: Secondary | ICD-10-CM | POA: Diagnosis not present

## 2019-06-21 ENCOUNTER — Emergency Department (HOSPITAL_COMMUNITY): Payer: Medicare HMO

## 2019-06-21 ENCOUNTER — Other Ambulatory Visit: Payer: Self-pay

## 2019-06-21 ENCOUNTER — Encounter (HOSPITAL_COMMUNITY): Payer: Self-pay

## 2019-06-21 ENCOUNTER — Emergency Department (HOSPITAL_COMMUNITY)
Admission: EM | Admit: 2019-06-21 | Discharge: 2019-06-21 | Disposition: A | Discharge status: Home / Self Care | Payer: Medicare HMO | Attending: Emergency Medicine | Admitting: Emergency Medicine

## 2019-06-21 DIAGNOSIS — Z79899 Other long term (current) drug therapy: Secondary | ICD-10-CM | POA: Diagnosis not present

## 2019-06-21 DIAGNOSIS — Z7982 Long term (current) use of aspirin: Secondary | ICD-10-CM | POA: Diagnosis not present

## 2019-06-21 DIAGNOSIS — R5381 Other malaise: Secondary | ICD-10-CM | POA: Diagnosis not present

## 2019-06-21 DIAGNOSIS — R531 Weakness: Secondary | ICD-10-CM | POA: Diagnosis not present

## 2019-06-21 DIAGNOSIS — I2510 Atherosclerotic heart disease of native coronary artery without angina pectoris: Secondary | ICD-10-CM | POA: Diagnosis not present

## 2019-06-21 DIAGNOSIS — R5383 Other fatigue: Secondary | ICD-10-CM | POA: Diagnosis not present

## 2019-06-21 DIAGNOSIS — Z87891 Personal history of nicotine dependence: Secondary | ICD-10-CM | POA: Diagnosis not present

## 2019-06-21 DIAGNOSIS — E876 Hypokalemia: Secondary | ICD-10-CM

## 2019-06-21 DIAGNOSIS — U071 COVID-19: Secondary | ICD-10-CM | POA: Diagnosis not present

## 2019-06-21 DIAGNOSIS — I10 Essential (primary) hypertension: Secondary | ICD-10-CM | POA: Diagnosis not present

## 2019-06-21 DIAGNOSIS — R7989 Other specified abnormal findings of blood chemistry: Secondary | ICD-10-CM

## 2019-06-21 LAB — CBC WITH DIFFERENTIAL/PLATELET
Abs Immature Granulocytes: 0.02 10*3/uL (ref 0.00–0.07)
Basophils Absolute: 0 10*3/uL (ref 0.0–0.1)
Basophils Relative: 0 %
Eosinophils Absolute: 0 10*3/uL (ref 0.0–0.5)
Eosinophils Relative: 1 %
HCT: 38.1 % (ref 36.0–46.0)
Hemoglobin: 12.7 g/dL (ref 12.0–15.0)
Immature Granulocytes: 1 %
Lymphocytes Relative: 16 %
Lymphs Abs: 0.6 10*3/uL — ABNORMAL LOW (ref 0.7–4.0)
MCH: 30.5 pg (ref 26.0–34.0)
MCHC: 33.3 g/dL (ref 30.0–36.0)
MCV: 91.4 fL (ref 80.0–100.0)
Monocytes Absolute: 0.4 10*3/uL (ref 0.1–1.0)
Monocytes Relative: 12 %
Neutro Abs: 2.7 10*3/uL (ref 1.7–7.7)
Neutrophils Relative %: 70 %
Platelets: 319 10*3/uL (ref 150–400)
RBC: 4.17 MIL/uL (ref 3.87–5.11)
RDW: 12.8 % (ref 11.5–15.5)
WBC: 3.8 10*3/uL — ABNORMAL LOW (ref 4.0–10.5)
nRBC: 0 % (ref 0.0–0.2)

## 2019-06-21 LAB — URINALYSIS, ROUTINE W REFLEX MICROSCOPIC
Bilirubin Urine: NEGATIVE
Glucose, UA: NEGATIVE mg/dL
Hgb urine dipstick: NEGATIVE
Ketones, ur: NEGATIVE mg/dL
Leukocytes,Ua: NEGATIVE
Nitrite: NEGATIVE
Protein, ur: NEGATIVE mg/dL
Specific Gravity, Urine: 1.005 (ref 1.005–1.030)
pH: 8 (ref 5.0–8.0)

## 2019-06-21 LAB — COMPREHENSIVE METABOLIC PANEL
ALT: 15 U/L (ref 0–44)
AST: 20 U/L (ref 15–41)
Albumin: 3.5 g/dL (ref 3.5–5.0)
Alkaline Phosphatase: 57 U/L (ref 38–126)
Anion gap: 11 (ref 5–15)
BUN: 10 mg/dL (ref 8–23)
CO2: 23 mmol/L (ref 22–32)
Calcium: 9.1 mg/dL (ref 8.9–10.3)
Chloride: 100 mmol/L (ref 98–111)
Creatinine, Ser: 1.13 mg/dL — ABNORMAL HIGH (ref 0.44–1.00)
GFR calc Af Amer: 55 mL/min — ABNORMAL LOW (ref >60)
GFR calc non Af Amer: 48 mL/min — ABNORMAL LOW (ref >60)
Glucose, Bld: 115 mg/dL — ABNORMAL HIGH (ref 70–99)
Potassium: 3.1 mmol/L — ABNORMAL LOW (ref 3.5–5.1)
Sodium: 134 mmol/L — ABNORMAL LOW (ref 135–145)
Total Bilirubin: 0.7 mg/dL (ref 0.3–1.2)
Total Protein: 5.6 g/dL — ABNORMAL LOW (ref 6.5–8.1)

## 2019-06-21 LAB — MAGNESIUM: Magnesium: 1.8 mg/dL (ref 1.7–2.4)

## 2019-06-21 MED ORDER — POTASSIUM CHLORIDE CRYS ER 20 MEQ PO TBCR
40.0000 meq | EXTENDED_RELEASE_TABLET | Freq: Once | ORAL | Status: AC
  Administered 2019-06-21: 40 meq via ORAL
  Filled 2019-06-21: qty 2

## 2019-06-21 MED ORDER — SODIUM CHLORIDE 0.9 % IV BOLUS
500.0000 mL | Freq: Once | INTRAVENOUS | Status: AC
  Administered 2019-06-21: 500 mL via INTRAVENOUS

## 2019-06-21 MED ORDER — POTASSIUM CHLORIDE CRYS ER 20 MEQ PO TBCR: 20.0000 meq | EXTENDED_RELEASE_TABLET | Freq: Every day | ORAL | 0 refills | Status: DC

## 2019-06-21 NOTE — ED Notes (Signed)
"  Patient verbalizes understanding of discharge instructions. Opportunity for questioning and answers were provided.  pt discharged from ED ."  

## 2019-06-21 NOTE — ED Triage Notes (Signed)
Pt brought in by [facility] from home for generalized weakness and fatigue starting Dec 2nd. Pt tested COVID positive on Monday. States she does not feel any improvement in her fatigue. Pt 98% on RA, VSS, in NAD.

## 2019-06-21 NOTE — Discharge Instructions (Addendum)
You were seen in the emergency department for generalized weakness and fatigue with COVID-19.  Your labs were overall reassuring.  Your potassium was somewhat low at 3.1, normal is 3.5-5.1, you are given potassium in the ER and we are sending you home with a supplement.  Your creatinine, which is a measure of kidney function, was mildly elevated at 1.13-this may be due to a degree of dehydration.  We would like you to have this and your potassium rechecked by primary care provider within 3 to 5 days.  Please return to the ER for new or worsening symptoms including but not limited to increased weakness, trouble breathing, passing out, chest pain, fever, or any other concerns.

## 2019-06-21 NOTE — ED Notes (Signed)
Per lab- able to add on magnesium

## 2019-06-21 NOTE — ED Provider Notes (Signed)
[facility] EMERGENCY DEPARTMENT Provider Note   CSN: [id number] Arrival date & time: 06/21/19  1305     History Chief Complaint  Patient presents with  . Fatigue    [name] is a 73 y.o. female with a history of GERD, fibromyalgia, hypertension, hyperlipidemia, IBS, CAD, and sleep apnea who presents to the emergency department via EMS with complaints of generalized weakness and fatigue in the setting of positive COVID-19 testing.  Patient states she has felt poorly for about 10 days, she was initially having cough which seems to be improving, but is experiencing persistent fatigue/generalized weakness.  She states that she has had somewhat poor p.o. intake, she does however continue to eat and drink.  She has had some loose stools that are nonbloody.  No alleviating or aggravating factors to her symptoms.  She called her PCP who recommended she come to the emergency department for fluids for possible dehydration.  Denies fever, chills, URI symptoms, dyspnea, chest pain, abdominal pain, melena, hematochezia, dysuria, lightheadedness, dizziness, or syncope.  HPI     Past Medical History:  Diagnosis Date  . Carotid artery disease ([place])    Doppler, April, 2012,  [id number] R. ICA, [id number] LICA... no significant change... frequency of Doppler moved to yearly because of stability  . Chest pain    Nuclear stress test 2006 normal  . Degenerative joint disease    cervical spine  . Depression   . Diastolic dysfunction    Echo report, June, 2013  . Diverticulosis of colon (without mention of hemorrhage) 10/03/2007   Colonoscopy  . Drug therapy    msucle aches with simvastatin, but tolerates 5mg  Creastor  . Ejection fraction    EF 60%, echo, December 20, 2011  . Fatigue    June, 2013  . Fibromyalgia   . GERD (gastroesophageal reflux disease)   . Hx of colonoscopy   . Hyperlipemia   . Hypertension   . IBS (irritable bowel syndrome)   . Internal hemorrhoids without  mention of complication [id number]   Colonoscopy  . Lumbar disc disease   . Migraines   . Obesity   . Pancreatitis   . Peripheral neuropathy   . Reflux esophagitis 04/21/00   EGD  . Sleep apnea   . Small fiber neuropathy     Patient Active Problem List   Diagnosis Date Noted  . Sleep apnea   . Small fiber neuropathy   . Peripheral neuropathy   . Pancreatitis   . Obesity   . Migraines   . Lumbar disc disease   . IBS (irritable bowel syndrome)   . Hypertension   . Hyperlipemia   . Hx of colonoscopy   . GERD (gastroesophageal reflux disease)   . Degenerative joint disease   . Encounter for Medicare annual wellness exam 02/25/2015  . Insomnia 02/18/2015  . Precordial chest pain 01/12/2015  . Fatigue   . Diastolic dysfunction   . Ejection fraction   . Routine health maintenance 03/26/2011  . Carotid artery disease ([place])   . Drug therapy   . PEDAL EDEMA 03/17/2010  . INSOMNIA, CHRONIC 04/17/2009  . OSTEOARTHROSIS UNSPEC WHETHER GEN/LOC ANK&FOOT 01/05/2009  . Depression 05/22/2008  . TINNITUS 08/30/2007  . Hyperlipidemia 04/05/2007  . PERIPHERAL NEUROPATHY 04/05/2007  . Essential hypertension 04/05/2007  . GASTROESOPHAGEAL REFLUX DISEASE 04/05/2007  . DEGENERATIVE JOINT DISEASE, CERVICAL SPINE 04/05/2007  . [place] DISEASE, LUMBAR 04/05/2007  . Fibromyalgia 04/05/2007  . MIGRAINES, HX OF 04/05/2007  . APPENDECTOMY, HX  OF 04/05/2007  . Reflux esophagitis 04/21/2000    Past Surgical History:  Procedure Laterality Date  . APPENDECTOMY  1961  . LAPAROSCOPIC OVARIAN CYSTECTOMY  1960's  . SHOULDER SURGERY  2004   "frozen"; right  . TRIGGER FINGER RELEASE  1996   right thumb     OB History   No obstetric history on file.     Family History  Problem Relation Age of Onset  . Coronary artery disease Father   . Heart attack Father   . Heart disease Father        CAD/MI x 3, last fatal  . Breast cancer Paternal Aunt        x 2  . Stroke Mother   . Dementia  Mother   . Heart disease Mother        chf  . Obesity Brother   . Hypertension Brother   . Diabetes Neg Hx   . Colon cancer Neg Hx     Social History   Tobacco Use  . Smoking status: Former Smoker    Packs/day: 1.00    Years: 15.00    Pack years: 15.00    Types: Cigarettes    Quit date: 07/11/1996    Years since quitting: 22.9  . Smokeless tobacco: Never Used  Substance Use Topics  . Alcohol use: No    Alcohol/week: 0.0 standard drinks  . Drug use: No    Home Medications Prior to Admission medications   Medication Sig Start Date End Date Taking? Authorizing Provider  acetaminophen (TYLENOL) 500 MG tablet Take 1,000 mg by mouth See admin instructions. Take 2 tablets (1000 mg) by mouth every morning, may also take 2 tablets (1000 mg) at night as needed for back pain   Yes [provider]  Ascorbic Acid (VITAMIN C) 1000 MG tablet Take 1,000 mg by mouth daily.   Yes [provider]  aspirin EC 81 MG tablet Take 81 mg by mouth at bedtime.   Yes [provider]  citalopram (CELEXA) 10 MG tablet Take 10 mg by mouth See admin instructions. Take one tablet (10 mg) by mouth Monday, Wednesday, Friday nights   Yes [provider]  Cyanocobalamin (VITAMIN B-12 PO) Take 2 tablets by mouth daily.   Yes [provider]  gabapentin (NEURONTIN) 300 MG capsule Take 1 capsule by mouth  twice daily Patient taking differently: Take 300 mg by mouth 2 (two) times daily.  09/24/14  Yes Tower, Wynelle Fanny, MD  hydrochlorothiazide (HYDRODIURIL) 25 MG tablet Take 1 tablet (25 mg total) by mouth daily. 01/14/15  Yes Carlena Bjornstad, MD  losartan (COZAAR) 100 MG tablet Take 1 tablet (100 mg total) by mouth daily. 01/14/15  Yes Carlena Bjornstad, MD  omeprazole (PRILOSEC) 40 MG capsule Take 1 capsule (40 mg total) by mouth daily. Patient taking differently: Take 40 mg by mouth every Monday, Wednesday, and Friday.  01/19/15  Yes Tower, Wynelle Fanny, MD  ondansetron (ZOFRAN) 4 MG  tablet Take 4 mg by mouth every 6 (six) hours as needed for nausea or vomiting.  06/14/19  Yes [provider]  OVER THE COUNTER MEDICATION Place 1 drop into both eyes daily as needed (dry eyes). Over the counter lubricating eye drop   Yes [provider]  phenylephrine (NEO-SYNEPHRINE) 1 % nasal spray Place 1 drop into both nostrils every 6 (six) hours as needed for congestion.   Yes [provider]  simvastatin (ZOCOR) 20 MG tablet Take one tablet  by mouth in the evenings three times weekly (Mon, Wed, Fri). Patient taking differently: Take 20 mg by mouth See admin instructions. Take one tablet (20 mg) by mouth Monday, Wednesday, Friday at bedtime 09/24/14  Yes Tower, Wynelle Fanny, MD  diltiazem (DILACOR XR) 180 MG 24 hr capsule Take 1 capsule (180 mg total) by mouth daily. 11/08/11 01/05/12  Norins, Heinz Knuckles, MD    Allergies    Mobic [meloxicam], Codeine, Nitrofuran derivatives, Penicillins, Temazepam, Celebrex [celecoxib], and Influenza vac split [flu virus vaccine]  Review of Systems   Review of Systems  Constitutional: Positive for fever.  HENT: Negative for congestion, ear discharge and sore throat.   Respiratory: Positive for cough (improving) and shortness of breath.   Cardiovascular: Negative for chest pain and leg swelling.  Gastrointestinal: Positive for diarrhea. Negative for abdominal pain, blood in stool, constipation, nausea and vomiting.  Genitourinary: Negative for dysuria.  Neurological: Positive for weakness (generalized). Negative for dizziness, syncope and light-headedness.  All other systems reviewed and are negative.   Physical Exam Updated Vital Signs BP (!) 154/63   Pulse 75   Temp 98.4 F (36.9 C) (Oral)   Resp 14   SpO2 100%   Physical Exam Vitals and nursing note reviewed.  Constitutional:      General: She is not in acute distress.    Appearance: She is well-developed. She is not toxic-appearing.  HENT:     Head: Normocephalic and  atraumatic.  Eyes:     General:        Right eye: No discharge.        Left eye: No discharge.     Conjunctiva/sclera: Conjunctivae normal.  Cardiovascular:     Rate and Rhythm: Normal rate and regular rhythm.  Pulmonary:     Effort: Pulmonary effort is normal. No respiratory distress.     Breath sounds: Normal breath sounds. No wheezing, rhonchi or rales.  Abdominal:     General: There is no distension.     Palpations: Abdomen is soft.     Tenderness: There is no abdominal tenderness. There is no guarding or rebound.  Musculoskeletal:     Cervical back: Neck supple.  Skin:    General: Skin is warm and dry.     Findings: No rash.  Neurological:     General: No focal deficit present.     Mental Status: She is alert.     Comments: Clear speech.  Symmetric 5 out of 5 strength with plantar dorsiflexion bilaterally and grip strength bilaterally.  Sensation grossly intact x 4.  Psychiatric:        Behavior: Behavior normal.     ED Results / Procedures / Treatments   Labs (all labs ordered are listed, but only abnormal results are displayed) Labs Reviewed  CBC WITH DIFFERENTIAL/PLATELET - Abnormal; Notable for the following components:      Result Value   WBC 3.8 (*)    Lymphs Abs 0.6 (*)    All other components within normal limits  URINALYSIS, ROUTINE W REFLEX MICROSCOPIC  COMPREHENSIVE METABOLIC PANEL    EKG EKG Interpretation  Date/Time:  Friday June 21 2019 13:14:10 EST Ventricular Rate:  84 PR Interval:    QRS Duration: 91 QT Interval:  374 QTC Calculation: 443 R Axis:   66 Text Interpretation: Sinus rhythm No significant change since last tracing Confirmed by Theotis Burrow [id number]) on 06/21/2019 2:19:41 PM   Radiology DG Chest Portable 1 View  Result Date: 06/21/2019 CLINICAL DATA:  Weakness.  COVID-19 positive EXAM: PORTABLE CHEST 1 VIEW COMPARISON:  07/10/2018 FINDINGS: The heart size and mediastinal contours are within normal limits. Both lungs are  clear. The visualized skeletal structures are unremarkable. IMPRESSION: No active disease. Electronically Signed   By: Franchot Gallo M.D.   On: 06/21/2019 13:37    Procedures Procedures (including critical care time)  Medications Ordered in ED Medications  sodium chloride 0.9 % bolus 500 mL (500 mLs Intravenous New Bag/Given 06/21/19 1347)    ED Course/MDM I have reviewed the triage vital signs and the nursing notes.  Pertinent labs & imaging results that were available during my care of the patient were reviewed by me and considered in my medical decision making (see chart for details).  Patient with known COVID-19 presents to the emergency department with complaints of generalized weakness and fatigue.  She is nontoxic-appearing, resting comfortably, vitals WNL with exception of elevated blood pressure, doubt HTN emergency.  Overall benign physical exam.  No signs of respiratory distress, lungs CTA, abdomen nontender without peritoneal signs, good strength throughout.  Plan for EKG, chest x-ray, and basic labs with administration of fluids.  CBC: Leukopenia consistent with COVID-19.  No anemia or thrombocytopenia. CMP: Mild hyponatremia, receiving NS, mild hypokalemia at 3.1, will replete and check magnesium level.  Mild elevation in creatinine compared to prior ranges at 1.13 today, most recently 0.78 but has been 0.97 in the past. Magnesium: WNL Urinalysis: No UTI.  Chest x-ray: No active disease EKG: Sinus rhythm without significant change since last tracing.  Overall reassuring work-up without significantly concerning abnormalities. Patient feeling improved s/p fluids, requesting to go home. She is ambulatory, tolerating PO, appears appropriate for discharge. Will send home with a few days of potassium supplementation, encouraged fluids, instructed close PCP follow up for repeat labs. I discussed results, treatment plan, need for follow-up, and return precautions with the patient.  Provided opportunity for questions, patient confirmed understanding and is in agreement with plan.   Findings and plan of care discussed with supervising physician Dr. Rex Kras who has evaluated patient & is in agreement.   Final Clinical Impression(s) / ED Diagnoses Final diagnoses:  Fatigue, unspecified type  Hypokalemia  Elevated serum creatinine    Rx / DC Orders ED Discharge Orders         Ordered    potassium chloride SA (KLOR-CON) 20 MEQ tablet  Daily     06/21/19 1609         DORA HATHEWAY was evaluated in Emergency Department on 06/23/2019 for the symptoms described in the history of present illness. He/she was evaluated in the context of the global COVID-19 pandemic, which necessitated consideration that the patient might be at risk for infection with the SARS-CoV-2 virus that causes COVID-19. Institutional protocols and algorithms that pertain to the evaluation of patients at risk for COVID-19 are in a state of rapid change based on information released by regulatory bodies including the CDC and federal and state organizations. These policies and algorithms were followed during the patient's care in the ED.    Catherine Dyke, PA-C 06/23/19 1104    Little, Wenda Overland, MD 06/24/19 [phone]

## 2019-07-03 DIAGNOSIS — H43811 Vitreous degeneration, right eye: Secondary | ICD-10-CM | POA: Diagnosis not present

## 2019-07-16 NOTE — Progress Notes (Signed)
Cardiology Office Note   Date:  07/29/2019   ID:  BRIANCA Vasquez, DOB 06/08/1946, MRN [id number]  PCP:  Reynold Bowen, MD  Cardiologist:  Jenkins Rouge, MD   No chief complaint on file.     History of Present Illness: [name] is a 74 y.o. female who presents today to follow-up Previous patient of Dr Ron Parker She does not have documented coronary disease. She had some chest discomfort 2016  and was admitted for evaluation. During the hospitalization the nuclear stress study was normal. She was discharged home. It is clear now that her symptoms were related to GERD at that time.    Compliant with BP meds Cozaar  Has carotid dx with plaque no stenosis by duplex July 2020   On zocor for HLD primary follows labs Takes Celexa for depression   Got COVID in December with husband but did not have to be hospitalized    Past Medical History:  Diagnosis Date  . Carotid artery disease ([place])    Doppler, April, 2012,  [id number] R. ICA, [id number] LICA... no significant change... frequency of Doppler moved to yearly because of stability  . Chest pain    Nuclear stress test 2006 normal  . Degenerative joint disease    cervical spine  . Depression   . Diastolic dysfunction    Echo report, June, 2013  . Diverticulosis of colon (without mention of hemorrhage) 10/03/2007   Colonoscopy  . Drug therapy    msucle aches with simvastatin, but tolerates 5mg  Creastor  . Ejection fraction    EF 60%, echo, December 20, 2011  . Fatigue    June, 2013  . Fibromyalgia   . GERD (gastroesophageal reflux disease)   . Hx of colonoscopy   . Hyperlipemia   . Hypertension   . IBS (irritable bowel syndrome)   . Internal hemorrhoids without mention of complication [id number]   Colonoscopy  . Lumbar disc disease   . Migraines   . Obesity   . Pancreatitis   . Peripheral neuropathy   . Reflux esophagitis 04/21/00   EGD  . Sleep apnea   . Small fiber neuropathy     Past Surgical History:  Procedure  Laterality Date  . APPENDECTOMY  1961  . LAPAROSCOPIC OVARIAN CYSTECTOMY  1960's  . SHOULDER SURGERY  2004   "frozen"; right  . TRIGGER FINGER RELEASE  1996   right thumb    Patient Active Problem List   Diagnosis Date Noted  . Sleep apnea   . Small fiber neuropathy   . Peripheral neuropathy   . Pancreatitis   . Obesity   . Migraines   . Lumbar disc disease   . IBS (irritable bowel syndrome)   . Hypertension   . Hyperlipemia   . Hx of colonoscopy   . GERD (gastroesophageal reflux disease)   . Degenerative joint disease   . Encounter for Medicare annual wellness exam 02/25/2015  . Insomnia 02/18/2015  . Precordial chest pain 01/12/2015  . Fatigue   . Diastolic dysfunction   . Ejection fraction   . Routine health maintenance 03/26/2011  . Carotid artery disease ([place])   . Drug therapy   . PEDAL EDEMA 03/17/2010  . INSOMNIA, CHRONIC 04/17/2009  . OSTEOARTHROSIS UNSPEC WHETHER GEN/LOC ANK&FOOT 01/05/2009  . Depression 05/22/2008  . TINNITUS 08/30/2007  . Hyperlipidemia 04/05/2007  . PERIPHERAL NEUROPATHY 04/05/2007  . Essential hypertension 04/05/2007  . GASTROESOPHAGEAL REFLUX DISEASE 04/05/2007  . DEGENERATIVE JOINT DISEASE, CERVICAL SPINE 04/05/2007  .  [place] DISEASE, LUMBAR 04/05/2007  . Fibromyalgia 04/05/2007  . MIGRAINES, HX OF 04/05/2007  . APPENDECTOMY, HX OF 04/05/2007  . Reflux esophagitis 04/21/2000      Current Outpatient Medications  Medication Sig Dispense Refill  . acetaminophen (TYLENOL) 500 MG tablet Take 1,000 mg by mouth See admin instructions. Take 2 tablets (1000 mg) by mouth every morning, may also take 2 tablets (1000 mg) at night as needed for back pain    . Ascorbic Acid (VITAMIN C) 1000 MG tablet Take 1,000 mg by mouth daily.    Marland Kitchen aspirin EC 81 MG tablet Take 81 mg by mouth at bedtime.    . citalopram (CELEXA) 10 MG tablet Take 10 mg by mouth See admin instructions. Take one tablet (10 mg) by mouth Monday, Wednesday, Friday nights    .  Cyanocobalamin (VITAMIN B-12 PO) Take 2 tablets by mouth daily.    Marland Kitchen gabapentin (NEURONTIN) 300 MG capsule Take 1 capsule by mouth  twice daily 180 capsule 3  . hydrochlorothiazide (HYDRODIURIL) 25 MG tablet Take 1 tablet (25 mg total) by mouth daily. 90 tablet 3  . losartan (COZAAR) 100 MG tablet Take 1 tablet (100 mg total) by mouth daily. 90 tablet 3  . omeprazole (PRILOSEC) 40 MG capsule Take 40 mg by mouth 3 (three) times a week. Patient takes M,W,F    . ondansetron (ZOFRAN) 4 MG tablet Take 4 mg by mouth every 6 (six) hours as needed for nausea or vomiting.     Marland Kitchen OVER THE COUNTER MEDICATION Place 1 drop into both eyes daily as needed (dry eyes). Over the counter lubricating eye drop    . phenylephrine (NEO-SYNEPHRINE) 1 % nasal spray Place 1 drop into both nostrils every 6 (six) hours as needed for congestion.    . simvastatin (ZOCOR) 20 MG tablet Take one tablet by mouth in the evenings three times weekly (Mon, Wed, Fri). 36 tablet 3   No current facility-administered medications for this visit.    Allergies:   Mobic [meloxicam], Codeine, Nitrofuran derivatives, Penicillins, Temazepam, Celebrex [celecoxib], and Influenza vac split [flu virus vaccine]    Social History:  The patient  reports that she quit smoking about 23 years ago. Her smoking use included cigarettes. She has a 15.00 pack-year smoking history. She has never used smokeless tobacco. She reports that she does not drink alcohol or use drugs.   Family History:  The patient's family history includes Breast cancer in her paternal aunt; Coronary artery disease in her father; Dementia in her mother; Heart attack in her father; Heart disease in her father and mother; Hypertension in her brother; Obesity in her brother; Stroke in her mother.    ROS:  Please see the history of present illness.     Patient denies fever, chills, headache, sweats, rash, change in vision, change in hearing, chest pain, cough, nausea or vomiting, urinary  symptoms. All other systems are reviewed and are negative.   PHYSICAL EXAM: VS:  BP 122/80 (BP Location: Left Arm, Patient Position: Sitting, Cuff Size: Normal)   Pulse 69   Ht 5\' 2"  (1.575 m)   Wt 172 lb 1.9 oz (78.1 kg)   SpO2 99%   BMI 31.48 kg/m  , Affect appropriate Healthy:  appears stated age HEENT: normal Neck supple with no adenopathy JVP normal no bruits no thyromegaly Lungs RUL wheezing  S1/S2 no murmur, no rub, gallop or click PMI normal Abdomen: benighn, BS positve, no tenderness, no AAA no bruit.  No HSM  or HJR Distal pulses intact with no bruits No edema Neuro non-focal Skin warm and dry No muscular weakness    EKG:   05/20/16 NSR normal ECG 06/09/17 SR rate 71 normal  07/10/18 SR rate 69 normal    Recent Labs: 06/21/2019: ALT 15; BUN 10; Creatinine, Ser 1.13; Hemoglobin 12.7; Magnesium 1.8; Platelets 319; Potassium 3.1; Sodium 134    Lipid Panel    Component Value Date/Time   CHOL 176 02/23/2015 0844   TRIG 62.0 02/23/2015 0844   TRIG 63 06/19/2006 0922   HDL 67.00 02/23/2015 0844   CHOLHDL 3 02/23/2015 0844   VLDL 12.4 02/23/2015 0844   LDLCALC 96 02/23/2015 0844      Wt Readings from Last 3 Encounters:  07/29/19 172 lb 1.9 oz (78.1 kg)  07/10/18 167 lb (75.8 kg)  06/09/17 173 lb 12.8 oz (78.8 kg)      Current medicines are reviewed  The patient understands her medicines.     ASSESSMENT AND PLAN:  Chest Pain: resolved normal myovue 2016 HTN:  Well controlled.  Continue current medications and low sodium Dash type diet.   Carotid: plaque no stenosis duplex done 07/25/18  continue 81 mg ASA  HLD:  On statin labs with primary  Depression: on celexa mood seems appropriate          F/U in a year    Jenkins Rouge

## 2019-07-19 DIAGNOSIS — H43811 Vitreous degeneration, right eye: Secondary | ICD-10-CM | POA: Diagnosis not present

## 2019-07-24 DIAGNOSIS — M545 Low back pain: Secondary | ICD-10-CM | POA: Diagnosis not present

## 2019-07-29 ENCOUNTER — Ambulatory Visit: Payer: Medicare HMO | Admitting: Cardiovascular Disease

## 2019-07-29 ENCOUNTER — Other Ambulatory Visit: Payer: Self-pay

## 2019-07-29 ENCOUNTER — Encounter: Payer: Self-pay | Admitting: Cardiovascular Disease

## 2019-07-29 VITALS — BP 122/80 | HR 69 | Ht 62.0 in | Wt 172.1 lb

## 2019-07-29 DIAGNOSIS — M545 Low back pain: Secondary | ICD-10-CM | POA: Diagnosis not present

## 2019-07-29 DIAGNOSIS — I10 Essential (primary) hypertension: Secondary | ICD-10-CM

## 2019-07-29 NOTE — Patient Instructions (Signed)
Medication Instructions:   *If you need a refill on your cardiac medications before your next appointment, please call your pharmacy*  Lab Work:  If you have labs (blood work) drawn today and your tests are completely normal, you will receive your results only by: Marland Kitchen MyChart Message (if you have MyChart) OR . A paper copy in the mail If you have any lab test that is abnormal or we need to change your treatment, we will call you to review the results.  Follow-Up: At [facility], you and your health needs are our priority.  As part of our continuing mission to provide you with exceptional heart care, we have created designated Provider Care Teams.  These Care Teams include your primary Cardiologist (physician) and Advanced Practice Providers (APPs -  Physician Assistants and Nurse Practitioners) who all work together to provide you with the care you need, when you need it.  Your next appointment:   1 year(s)  The format for your next appointment:   In Person  Provider:   You may see Jenkins Rouge, MD or one of the following Advanced Practice Providers on your designated Care Team:    Truitt Merle, NP  Cecilie Kicks, NP  Kathyrn Drown, NP

## 2019-08-02 DIAGNOSIS — M545 Low back pain: Secondary | ICD-10-CM | POA: Diagnosis not present

## 2019-08-06 DIAGNOSIS — M545 Low back pain: Secondary | ICD-10-CM | POA: Diagnosis not present

## 2019-08-07 DIAGNOSIS — E669 Obesity, unspecified: Secondary | ICD-10-CM | POA: Diagnosis not present

## 2019-08-07 DIAGNOSIS — Z1331 Encounter for screening for depression: Secondary | ICD-10-CM | POA: Diagnosis not present

## 2019-08-07 DIAGNOSIS — M170 Bilateral primary osteoarthritis of knee: Secondary | ICD-10-CM | POA: Diagnosis not present

## 2019-08-07 DIAGNOSIS — N1831 Chronic kidney disease, stage 3a: Secondary | ICD-10-CM | POA: Diagnosis not present

## 2019-08-07 DIAGNOSIS — I129 Hypertensive chronic kidney disease with stage 1 through stage 4 chronic kidney disease, or unspecified chronic kidney disease: Secondary | ICD-10-CM | POA: Diagnosis not present

## 2019-08-07 DIAGNOSIS — E559 Vitamin D deficiency, unspecified: Secondary | ICD-10-CM | POA: Diagnosis not present

## 2019-08-07 DIAGNOSIS — E785 Hyperlipidemia, unspecified: Secondary | ICD-10-CM | POA: Diagnosis not present

## 2019-08-07 DIAGNOSIS — M8580 Other specified disorders of bone density and structure, unspecified site: Secondary | ICD-10-CM | POA: Diagnosis not present

## 2019-08-07 DIAGNOSIS — R69 Illness, unspecified: Secondary | ICD-10-CM | POA: Diagnosis not present

## 2019-08-07 DIAGNOSIS — I700 Atherosclerosis of aorta: Secondary | ICD-10-CM | POA: Diagnosis not present

## 2019-08-07 DIAGNOSIS — I5189 Other ill-defined heart diseases: Secondary | ICD-10-CM | POA: Diagnosis not present

## 2019-08-08 DIAGNOSIS — L570 Actinic keratosis: Secondary | ICD-10-CM | POA: Diagnosis not present

## 2019-08-08 DIAGNOSIS — G608 Other hereditary and idiopathic neuropathies: Secondary | ICD-10-CM | POA: Diagnosis not present

## 2019-08-09 DIAGNOSIS — M545 Low back pain: Secondary | ICD-10-CM | POA: Diagnosis not present

## 2020-07-22 NOTE — Progress Notes (Signed)
Cardiology Office Note   Date:  07/29/2020   ID:  [name], DOB 12-07-1945, MRN [id number]  PCP:  Reynold Bowen, MD  Cardiologist:  Jenkins Rouge, MD   No chief complaint on file.     History of Present Illness: [name] is a 75 y.o. female who presents today to follow-up Previous patient of Dr Ron Parker She does not have documented coronary disease. She had some chest discomfort 2016  and was admitted for evaluation. During the hospitalization the nuclear stress study was normal. Symptoms thought due to GERD     Compliant with BP meds Cozaar  Has carotid dx with plaque no stenosis by duplex July 2020   On zocor for HLD primary follows labs Takes Celexa for depression   Got COVID in December with husband but did not have to be hospitalized  Legs feel heavy Harder to get around Some arthritis in knees This is not claudication she has plus 3 DP/PT bilaterally  And pain/heaviness is at rest and exertion   Past Medical History:  Diagnosis Date  . Carotid artery disease ([place])    Doppler, April, 2012,  [id number] R. ICA, [id number] LICA... no significant change... frequency of Doppler moved to yearly because of stability  . Chest pain    Nuclear stress test 2006 normal  . Degenerative joint disease    cervical spine  . Depression   . Diastolic dysfunction    Echo report, June, 2013  . Diverticulosis of colon (without mention of hemorrhage) 10/03/2007   Colonoscopy  . Drug therapy    msucle aches with simvastatin, but tolerates 5mg  Creastor  . Ejection fraction    EF 60%, echo, December 20, 2011  . Fatigue    June, 2013  . Fibromyalgia   . GERD (gastroesophageal reflux disease)   . Hx of colonoscopy   . Hyperlipemia   . Hypertension   . IBS (irritable bowel syndrome)   . Internal hemorrhoids without mention of complication [id number]   Colonoscopy  . Lumbar disc disease   . Migraines   . Obesity   . Pancreatitis   . Peripheral neuropathy   . Reflux esophagitis  04/21/00   EGD  . Sleep apnea   . Small fiber neuropathy     Past Surgical History:  Procedure Laterality Date  . APPENDECTOMY  1961  . LAPAROSCOPIC OVARIAN CYSTECTOMY  1960's  . SHOULDER SURGERY  2004   "frozen"; right  . TRIGGER FINGER RELEASE  1996   right thumb    Patient Active Problem List   Diagnosis Date Noted  . Sleep apnea   . Small fiber neuropathy   . Peripheral neuropathy   . Pancreatitis   . Obesity   . Migraines   . Lumbar disc disease   . IBS (irritable bowel syndrome)   . Hypertension   . Hyperlipemia   . Hx of colonoscopy   . GERD (gastroesophageal reflux disease)   . Degenerative joint disease   . Encounter for Medicare annual wellness exam 02/25/2015  . Insomnia 02/18/2015  . Precordial chest pain 01/12/2015  . Fatigue   . Diastolic dysfunction   . Ejection fraction   . Routine health maintenance 03/26/2011  . Carotid artery disease ([place])   . Drug therapy   . PEDAL EDEMA 03/17/2010  . INSOMNIA, CHRONIC 04/17/2009  . OSTEOARTHROSIS UNSPEC WHETHER GEN/LOC ANK&FOOT 01/05/2009  . Depression 05/22/2008  . TINNITUS 08/30/2007  . Hyperlipidemia 04/05/2007  . PERIPHERAL NEUROPATHY 04/05/2007  . Essential  hypertension 04/05/2007  . GASTROESOPHAGEAL REFLUX DISEASE 04/05/2007  . DEGENERATIVE JOINT DISEASE, CERVICAL SPINE 04/05/2007  . [place] DISEASE, LUMBAR 04/05/2007  . Fibromyalgia 04/05/2007  . MIGRAINES, HX OF 04/05/2007  . APPENDECTOMY, HX OF 04/05/2007  . Reflux esophagitis 04/21/2000      Current Outpatient Medications  Medication Sig Dispense Refill  . acetaminophen (TYLENOL) 500 MG tablet Take 1,000 mg by mouth See admin instructions. Take 2 tablets (1000 mg) by mouth every morning, may also take 2 tablets (1000 mg) at night as needed for back pain    . Ascorbic Acid (VITAMIN C) 1000 MG tablet Take 1,000 mg by mouth daily.    Marland Kitchen aspirin EC 81 MG tablet Take 81 mg by mouth at bedtime.    . Cyanocobalamin (VITAMIN B-12 PO) Take 2 tablets by  mouth daily.    Marland Kitchen gabapentin (NEURONTIN) 300 MG capsule Take 1 capsule by mouth  twice daily (Patient taking differently: Take 300 mg by mouth at bedtime. Take 1 capsule by mouth  twice daily) 180 capsule 3  . hydrochlorothiazide (HYDRODIURIL) 25 MG tablet Take 1 tablet (25 mg total) by mouth daily. 90 tablet 3  . losartan (COZAAR) 100 MG tablet Take 1 tablet (100 mg total) by mouth daily. 90 tablet 3  . omeprazole (PRILOSEC) 40 MG capsule Take 40 mg by mouth 3 (three) times a week. Patient takes M,W,F    . ondansetron (ZOFRAN) 4 MG tablet Take 4 mg by mouth every 6 (six) hours as needed for nausea or vomiting.     Marland Kitchen OVER THE COUNTER MEDICATION Place 1 drop into both eyes daily as needed (dry eyes). Over the counter lubricating eye drop    . phenylephrine (NEO-SYNEPHRINE) 1 % nasal spray Place 1 drop into both nostrils every 6 (six) hours as needed for congestion.    . DULoxetine (CYMBALTA) 30 MG capsule Take 30 mg by mouth daily.    . rosuvastatin (CRESTOR) 5 MG tablet Take 5 mg by mouth daily.     No current facility-administered medications for this visit.    Allergies:   Mobic [meloxicam], Codeine, Nitrofuran derivatives, Penicillins, Temazepam, Celebrex [celecoxib], and Influenza vac split [influenza virus vaccine]    Social History:  The patient  reports that she quit smoking about 24 years ago. Her smoking use included cigarettes. She has a 15.00 pack-year smoking history. She has never used smokeless tobacco. She reports that she does not drink alcohol and does not use drugs.   Family History:  The patient's family history includes Breast cancer in her paternal aunt; Coronary artery disease in her father; Dementia in her mother; Heart attack in her father; Heart disease in her father and mother; Hypertension in her brother; Obesity in her brother; Stroke in her mother.    ROS:  Please see the history of present illness.     Patient denies fever, chills, headache, sweats, rash, change in  vision, change in hearing, chest pain, cough, nausea or vomiting, urinary symptoms. All other systems are reviewed and are negative.   PHYSICAL EXAM: VS:  BP (!) 148/82   Pulse 82   Ht 5' 1.25" (1.556 m)   Wt 76.2 kg   SpO2 99%   BMI 31.48 kg/m  , Affect appropriate Healthy:  appears stated age HEENT: normal Neck supple with no adenopathy JVP normal no bruits no thyromegaly Lungs RUL wheezing  S1/S2 no murmur, no rub, gallop or click PMI normal Abdomen: benighn, BS positve, no tenderness, no AAA no bruit.  No HSM or HJR Distal pulses intact with no bruits No edema Neuro non-focal Skin warm and dry No muscular weakness    EKG:  07/29/2020 NSR normal ECG    Recent Labs: No results found for requested labs within last 8760 hours.    Lipid Panel    Component Value Date/Time   CHOL 176 02/23/2015 0844   TRIG 62.0 02/23/2015 0844   TRIG 63 06/19/2006 0922   HDL 67.00 02/23/2015 0844   CHOLHDL 3 02/23/2015 0844   VLDL 12.4 02/23/2015 0844   LDLCALC 96 02/23/2015 0844      Wt Readings from Last 3 Encounters:  07/29/20 76.2 kg  07/29/19 78.1 kg  07/10/18 75.8 kg     Current medicines are reviewed  The patient understands her medicines.   ASSESSMENT AND PLAN:  Chest Pain: resolved normal myovue 2016 HTN:  Well controlled.  Continue current medications and low sodium Dash type diet.   Carotid: plaque no stenosis duplex done 07/25/18  continue 81 mg ASA  HLD:  On statin labs with primary  Depression: on celexa mood seems appropriate  Leg Pain:  Not vascular suspect she would benefit from PT and Rx arthritis non need For ABI"s or [place] with such good pulses on exam         F/U in a year    Jenkins Rouge

## 2020-07-29 ENCOUNTER — Ambulatory Visit: Payer: Medicare Other | Admitting: Cardiovascular Disease

## 2020-07-29 ENCOUNTER — Encounter: Payer: Self-pay | Admitting: Cardiovascular Disease

## 2020-07-29 ENCOUNTER — Other Ambulatory Visit: Payer: Self-pay

## 2020-07-29 VITALS — BP 148/82 | HR 82 | Ht 61.25 in | Wt 168.0 lb

## 2020-07-29 DIAGNOSIS — M79605 Pain in left leg: Secondary | ICD-10-CM

## 2020-07-29 DIAGNOSIS — M79604 Pain in right leg: Secondary | ICD-10-CM | POA: Diagnosis not present

## 2020-07-29 DIAGNOSIS — I10 Essential (primary) hypertension: Secondary | ICD-10-CM

## 2020-07-29 NOTE — Patient Instructions (Signed)
Medication Instructions:  *If you need a refill on your cardiac medications before your next appointment, please call your pharmacy*   Lab Work: If you have labs (blood work) drawn today and your tests are completely normal, you will receive your results only by: Marland Kitchen MyChart Message (if you have MyChart) OR . A paper copy in the mail If you have any lab test that is abnormal or we need to change your treatment, we will call you to review the results.  Follow-Up: At [facility], you and your health needs are our priority.  As part of our continuing mission to provide you with exceptional heart care, we have created designated Provider Care Teams.  These Care Teams include your primary Cardiologist (physician) and Advanced Practice Providers (APPs -  Physician Assistants and Nurse Practitioners) who all work together to provide you with the care you need, when you need it.  We recommend signing up for the patient portal called "MyChart".  Sign up information is provided on this After Visit Summary.  MyChart is used to connect with patients for Virtual Visits (Telemedicine).  Patients are able to view lab/test results, encounter notes, upcoming appointments, etc.  Non-urgent messages can be sent to your provider as well.   To learn more about what you can do with MyChart, go to [email or web address].    Your next appointment:   1 year(s)  The format for your next appointment:   In Person  Provider:   You may see Jenkins Rouge, MD or one of the following Advanced Practice Providers on your designated Care Team:    Cecilie Kicks, NP  Kathyrn Drown, NP

## 2020-08-28 NOTE — H&P (Signed)
TOTAL KNEE ADMISSION H&P  Patient is being admitted for right total knee arthroplasty.  Subjective:  Chief Complaint:right knee pain.  HPI: [name], 75 y.o. female, has a history of pain and functional disability in the right knee due to arthritis and has failed non-surgical conservative treatments for greater than 12 weeks to includeNSAID's and/or analgesics, corticosteriod injections, viscosupplementation injections, use of assistive devices and activity modification.  Onset of symptoms was gradual, starting 1 years ago with gradually worsening course since that time. The patient noted no past surgery on the right knee(s).  Patient currently rates pain in the right knee(s) at 3 out of 10 with activity. Patient has night pain, worsening of pain with activity and weight bearing and pain that interferes with activities of daily living.  Patient has evidence of subchondral cysts, subchondral sclerosis, periarticular osteophytes and joint space narrowing by imaging studies. This patient has had no previous injury. There is no active infection.  Patient Active Problem List   Diagnosis Date Noted  . Sleep apnea   . Small fiber neuropathy   . Peripheral neuropathy   . Pancreatitis   . Obesity   . Migraines   . Lumbar disc disease   . IBS (irritable bowel syndrome)   . Hypertension   . Hyperlipemia   . Hx of colonoscopy   . GERD (gastroesophageal reflux disease)   . Degenerative joint disease   . Encounter for Medicare annual wellness exam 02/25/2015  . Insomnia 02/18/2015  . Precordial chest pain 01/12/2015  . Fatigue   . Diastolic dysfunction   . Ejection fraction   . Routine health maintenance 03/26/2011  . Carotid artery disease ([place])   . Drug therapy   . PEDAL EDEMA 03/17/2010  . INSOMNIA, CHRONIC 04/17/2009  . OSTEOARTHROSIS UNSPEC WHETHER GEN/LOC ANK&FOOT 01/05/2009  . Depression 05/22/2008  . TINNITUS 08/30/2007  . Hyperlipidemia 04/05/2007  . PERIPHERAL NEUROPATHY  04/05/2007  . Essential hypertension 04/05/2007  . GASTROESOPHAGEAL REFLUX DISEASE 04/05/2007  . DEGENERATIVE JOINT DISEASE, CERVICAL SPINE 04/05/2007  . [place] DISEASE, LUMBAR 04/05/2007  . Fibromyalgia 04/05/2007  . MIGRAINES, HX OF 04/05/2007  . APPENDECTOMY, HX OF 04/05/2007  . Reflux esophagitis 04/21/2000   Past Medical History:  Diagnosis Date  . Carotid artery disease ([place])    Doppler, April, 2012,  2-95% R. ICA, 18-84% LICA... no significant change... frequency of Doppler moved to yearly because of stability  . Chest pain    Nuclear stress test 2006 normal  . Degenerative joint disease    cervical spine  . Depression   . Diastolic dysfunction    Echo report, June, 2013  . Diverticulosis of colon (without mention of hemorrhage) 10/03/2007   Colonoscopy  . Drug therapy    msucle aches with simvastatin, but tolerates 5mg  Creastor  . Ejection fraction    EF 60%, echo, December 20, 2011  . Fatigue    June, 2013  . Fibromyalgia   . GERD (gastroesophageal reflux disease)   . Hx of colonoscopy   . Hyperlipemia   . Hypertension   . IBS (irritable bowel syndrome)   . Internal hemorrhoids without mention of complication 1/66/0630   Colonoscopy  . Lumbar disc disease   . Migraines   . Obesity   . Pancreatitis   . Peripheral neuropathy   . Reflux esophagitis 04/21/00   EGD  . Sleep apnea   . Small fiber neuropathy     Past Surgical History:  Procedure Laterality Date  . APPENDECTOMY  1961  .  LAPAROSCOPIC OVARIAN CYSTECTOMY  1960's  . SHOULDER SURGERY  2004   "frozen"; right  . TRIGGER FINGER RELEASE  1996   right thumb    No current facility-administered medications for this encounter.   Current Outpatient Medications  Medication Sig Dispense Refill Last Dose  . acetaminophen (TYLENOL) 650 MG CR tablet Take 1,300 mg by mouth daily.     Marland Kitchen Apoaequorin (PREVAGEN EXTRA STRENGTH) 20 MG CAPS Take 20 mg by mouth daily.     Marland Kitchen aspirin EC 81 MG tablet Take 81 mg by mouth at  bedtime.     . Cyanocobalamin (B-12) 2500 MCG TABS Take 2,500 mcg by mouth 2 (two) times a week.     . DULoxetine (CYMBALTA) 30 MG capsule Take 30 mg by mouth daily.     Marland Kitchen gabapentin (NEURONTIN) 300 MG capsule Take 1 capsule by mouth  twice daily (Patient taking differently: Take 300 mg by mouth at bedtime.) 180 capsule 3   . hydrochlorothiazide (HYDRODIURIL) 25 MG tablet Take 1 tablet (25 mg total) by mouth daily. 90 tablet 3   . HYDROcodone-acetaminophen (NORCO) 10-325 MG tablet Take 1 tablet by mouth daily as needed for moderate pain.     . Liniments (BLUE-EMU SUPER STRENGTH EX) Apply 1 application topically daily as needed (pain).     Marland Kitchen losartan (COZAAR) 100 MG tablet Take 1 tablet (100 mg total) by mouth daily. 90 tablet 3   . Melatonin 10 MG CAPS Take 10 mg by mouth at bedtime.     . methocarbamol (ROBAXIN) 500 MG tablet Take 500 mg by mouth daily as needed for muscle spasms.     Marland Kitchen omeprazole (PRILOSEC) 40 MG capsule Take 40 mg by mouth every Monday, Wednesday, and Friday.     . ondansetron (ZOFRAN) 4 MG tablet Take 4 mg by mouth every 6 (six) hours as needed for nausea or vomiting.      Marland Kitchen oxymetazoline (AFRIN) 0.05 % nasal spray Place 1 spray into both nostrils 2 (two) times daily as needed for congestion.     . rosuvastatin (CRESTOR) 5 MG tablet Take 5 mg by mouth daily.      Allergies  Allergen Reactions  . Mobic [Meloxicam] Other (See Comments)    Chest pain   . Codeine Nausea And Vomiting  . Nitrofuran Derivatives Diarrhea  . Penicillins Nausea And Vomiting    Reported by [facility] April 2020 - pt is not sure about this one Did it involve swelling of the face/tongue/throat, SOB, or low BP? Unknown Did it involve sudden or severe rash/hives, skin peeling, or any reaction on the inside of your mouth or nose? Unknown Did you need to seek medical attention at a hospital or doctor's office? Unknown When did it last happen?unknown If all above answers are "NO", may proceed with  cephalosporin use.  . Temazepam Other (See Comments)    Memory loss   . Celebrex [Celecoxib] Rash  . Influenza Vac Split [Influenza Virus Vaccine] Rash    Rash on face and body    Social History   Tobacco Use  . Smoking status: Former Smoker    Packs/day: 1.00    Years: 15.00    Pack years: 15.00    Types: Cigarettes    Quit date: 07/11/1996    Years since quitting: 24.1  . Smokeless tobacco: Never Used  Substance Use Topics  . Alcohol use: No    Alcohol/week: 0.0 standard drinks    Family History  Problem Relation Age  of Onset  . Coronary artery disease Father   . Heart attack Father   . Heart disease Father        CAD/MI x 3, last fatal  . Breast cancer Paternal Aunt        x 2  . Stroke Mother   . Dementia Mother   . Heart disease Mother        chf  . Obesity Brother   . Hypertension Brother   . Diabetes Neg Hx   . Colon cancer Neg Hx      Review of Systems  All other systems reviewed and are negative.   Objective:  Physical Exam Constitutional:      Appearance: Normal appearance. She is normal weight.  HENT:     Head: Normocephalic and atraumatic.  Neck:     Vascular: No carotid bruit.  Cardiovascular:     Rate and Rhythm: Normal rate and regular rhythm.     Pulses: Normal pulses.     Heart sounds: Normal heart sounds. No murmur heard. No friction rub. No gallop.   Pulmonary:     Effort: Pulmonary effort is normal. No respiratory distress.     Breath sounds: Normal breath sounds. No stridor. No wheezing, rhonchi or rales.  Chest:     Chest wall: No tenderness.  Musculoskeletal:        General: Swelling and tenderness (medial joint line) present.     Cervical back: Normal range of motion and neck supple. No rigidity or tenderness.  Lymphadenopathy:     Cervical: No cervical adenopathy.  Skin:    General: Skin is warm and dry.     Capillary Refill: Capillary refill takes less than 2 seconds.  Neurological:     General: No focal deficit present.      Mental Status: She is alert and oriented to person, place, and time.  Psychiatric:        Mood and Affect: Mood normal.        Behavior: Behavior normal.        Thought Content: Thought content normal.        Judgment: Judgment normal.     Vital signs in last 24 hours: BP: ()/()  Arterial Line BP: ()/()   Labs:   Estimated body mass index is 31.48 kg/m as calculated from the following:   Height as of 07/29/20: 5' 1.25" (1.556 m).   Weight as of 07/29/20: 76.2 kg.   Imaging Review Plain radiographs demonstrate severe degenerative joint disease of the right knee(s). The overall alignment ismild varus. The bone quality appears to be fair for age and reported activity level.      Assessment/Plan:  End stage arthritis, right knee   The patient history, physical examination, clinical judgment of the provider and imaging studies are consistent with end stage degenerative joint disease of the right knee(s) and total knee arthroplasty is deemed medically necessary. The treatment options including medical management, injection therapy arthroscopy and arthroplasty were discussed at length. The risks and benefits of total knee arthroplasty were presented and reviewed. The risks due to aseptic loosening, infection, stiffness, patella tracking problems, thromboembolic complications and other imponderables were discussed. The patient acknowledged the explanation, agreed to proceed with the plan and consent was signed. Patient is being admitted for inpatient treatment for surgery, pain control, PT, OT, prophylactic antibiotics, VTE prophylaxis, progressive ambulation and ADL's and discharge planning. The patient is planning to be discharged home with outpatient PT. Also anticipating same day discharge.  Patient's anticipated LOS is less than 2 midnights, meeting these requirements: - Younger than 14 - Lives within 1 hour of care - Has a competent adult at home to recover with post-op  recover - NO history of  - Chronic pain requiring opiods  - Diabetes  - Coronary Artery Disease  - Heart failure  - Heart attack  - Stroke  - DVT/VTE  - Cardiac arrhythmia  - Respiratory Failure/COPD  - Renal failure  - Anemia  - Advanced Liver disease

## 2020-09-01 NOTE — Patient Instructions (Addendum)
DUE TO COVID-19 ONLY ONE VISITOR IS ALLOWED TO COME WITH YOU AND STAY IN THE WAITING ROOM ONLY DURING PRE OP AND PROCEDURE.   IF YOU WILL BE ADMITTED INTO THE HOSPITAL YOU ARE ALLOWED ONE SUPPORT PERSON DURING VISITATION HOURS ONLY (10AM -8PM)   . The support person may change daily. . The support person must pass our screening, gel in and out, and wear a mask at all times, including in the patient's room. . Patients must also wear a mask when staff or their support person are in the room.   COVID SWAB TESTING MUST BE COMPLETED ON:   Saturday, 09-05-20 @ 10:40 AM   [address] [place],  [address]  (Must self quarantine after testing. Follow instructions on handout.)        Your procedure is scheduled on:   Wednesday, 09-09-20   Report to [facility]  Entrance   Report to admitting at 6:00 AM   Call this number if you have problems the morning of surgery [phone]   Do not eat food :After Midnight.   May have liquids until 5:30 AM day of surgery  CLEAR LIQUID DIET  Foods Allowed                                                                     Foods Excluded  Water, Black Coffee and tea, regular and decaf              liquids that you cannot  Plain Jell-O in any flavor  (No red)                                     see through such as: Fruit ices (not with fruit pulp)                                      milk, soups, orange juice              Iced Popsicles (No red)                                      All solid food                                   Apple juices Sports drinks like Gatorade (No red) Lightly seasoned clear broth or consume(fat free) Sugar, honey syrup     Complete one Ensure drink the morning of surgery at 5:30 AM the day of surgery.       1. The day of surgery:  ? Drink ONE (1) Pre-Surgery Clear Ensure or G2 by am the morning of surgery. Drink in one sitting. Do not sip.  ? This drink was given to you during your hospital  pre-op  appointment visit. ? Nothing else to drink after completing the  Pre-Surgery Clear Ensure or G2.          If you  have questions, please contact your surgeon's office.     Oral Hygiene is also important to reduce your risk of infection.                                    Remember - BRUSH YOUR TEETH THE MORNING OF SURGERY WITH YOUR REGULAR TOOTHPASTE   Do NOT smoke after Midnight   Take these medicines the morning of surgery with A SIP OF WATER:  Duloxetine, Rosuvastatin                                You may not have any metal on your body including hair pins, jewelry, and body piercings             Do not wear make-up, lotions, powders, perfumes/cologne, or deodorant             Do not wear nail polish.  Do not shave  48 hours prior to surgery.             Do not bring valuables to the hospital. [place].   Contacts, dentures or bridgework may not be worn into surgery.   Bring small overnight bag day of surgery.    Special Instructions: Bring a copy of your healthcare power of attorney and living will documents         the day of surgery if you haven't  scanned them in before.              Please read over the following fact sheets you were given: IF YOU HAVE QUESTIONS ABOUT YOUR PRE OP INSTRUCTIONS PLEASE CALL  [facility] - Preparing for Surgery Before surgery, you can play an important role.  Because skin is not sterile, your skin needs to be as free of germs as possible.  You can reduce the number of germs on your skin by washing with CHG (chlorahexidine gluconate) soap before surgery.  CHG is an antiseptic cleaner which kills germs and bonds with the skin to continue killing germs even after washing. Please DO NOT use if you have an allergy to CHG or antibacterial soaps.  If your skin becomes reddened/irritated stop using the CHG and inform your nurse when you arrive at Short Stay. Do not shave (including  legs and underarms) for at least 48 hours prior to the first CHG shower.  You may shave your face/neck.  Please follow these instructions carefully:  1.  Shower with CHG Soap the night before surgery and the  morning of surgery.  2.  If you choose to wash your hair, wash your hair first as usual with your normal  shampoo.  3.  After you shampoo, rinse your hair and body thoroughly to remove the shampoo.                             4.  Use CHG as you would any other liquid soap.  You can apply chg directly to the skin and wash.  Gently with a scrungie or clean washcloth.  5.  Apply the CHG Soap to your body ONLY FROM THE NECK DOWN.   Do   not use on face/  open                           Wound or open sores. Avoid contact with eyes, ears mouth and   genitals (private parts).                       Wash face,  Genitals (private parts) with your normal soap.             6.  Wash thoroughly, paying special attention to the area where your    surgery  will be performed.  7.  Thoroughly rinse your body with warm water from the neck down.  8.  DO NOT shower/wash with your normal soap after using and rinsing off the CHG Soap.                9.  Pat yourself dry with a clean towel.            10.  Wear clean pajamas.            11.  Place clean sheets on your bed the night of your first shower and do not  sleep with pets. Day of Surgery : Do not apply any lotions/deodorants the morning of surgery.  Please wear clean clothes to the hospital/surgery center.  FAILURE TO FOLLOW THESE INSTRUCTIONS MAY RESULT IN THE CANCELLATION OF YOUR SURGERY  PATIENT SIGNATURE_________________________________  NURSE SIGNATURE__________________________________  ________________________________________________________________________   Adam Phenix  An incentive spirometer is a tool that can help keep your lungs clear and active. This tool measures how well you are filling your lungs with each breath. Taking  long deep breaths may help reverse or decrease the chance of developing breathing (pulmonary) problems (especially infection) following:  A long period of time when you are unable to move or be active. BEFORE THE PROCEDURE   If the spirometer includes an indicator to show your best effort, your nurse or respiratory therapist will set it to a desired goal.  If possible, sit up straight or lean slightly forward. Try not to slouch.  Hold the incentive spirometer in an upright position. INSTRUCTIONS FOR USE  1. Sit on the edge of your bed if possible, or sit up as far as you can in bed or on a chair. 2. Hold the incentive spirometer in an upright position. 3. Breathe out normally. 4. Place the mouthpiece in your mouth and seal your lips tightly around it. 5. Breathe in slowly and as deeply as possible, raising the piston or the ball toward the top of the column. 6. Hold your breath for 3-5 seconds or for as long as possible. Allow the piston or ball to fall to the bottom of the column. 7. Remove the mouthpiece from your mouth and breathe out normally. 8. Rest for a few seconds and repeat Steps 1 through 7 at least 10 times every 1-2 hours when you are awake. Take your time and take a few normal breaths between deep breaths. 9. The spirometer may include an indicator to show your best effort. Use the indicator as a goal to work toward during each repetition. 10. After each set of 10 deep breaths, practice coughing to be sure your lungs are clear. If you have an incision (the cut made at the time of surgery), support your incision when coughing by placing a pillow or rolled up towels firmly against it. Once you are able to get out of  bed, walk around indoors and cough well. You may stop using the incentive spirometer when instructed by your caregiver.  RISKS AND COMPLICATIONS  Take your time so you do not get dizzy or light-headed.  If you are in pain, you may need to take or ask for pain  medication before doing incentive spirometry. It is harder to take a deep breath if you are having pain. AFTER USE  Rest and breathe slowly and easily.  It can be helpful to keep track of a log of your progress. Your caregiver can provide you with a simple table to help with this. If you are using the spirometer at home, follow these instructions: [place] IF:   You are having difficultly using the spirometer.  You have trouble using the spirometer as often as instructed.  Your pain medication is not giving enough relief while using the spirometer.  You develop fever of 100.5 F (38.1 C) or higher. SEEK IMMEDIATE MEDICAL CARE IF:   You cough up bloody sputum that had not been present before.  You develop fever of 102 F (38.9 C) or greater.  You develop worsening pain at or near the incision site. MAKE SURE YOU:   Understand these instructions.  Will watch your condition.  Will get help right away if you are not doing well or get worse. Document Released: 11/07/2006 Document Revised: 09/19/2011 Document Reviewed: 01/08/2007 Exit[employer] Patient Information 2014 [employer], [place].   ________________________________________________________________________  WHAT IS A BLOOD TRANSFUSION? Blood Transfusion Information  A transfusion is the replacement of blood or some of its parts. Blood is made up of multiple cells which provide different functions.  Red blood cells carry oxygen and are used for blood loss replacement.  White blood cells fight against infection.  Platelets control bleeding.  Plasma helps clot blood.  Other blood products are available for specialized needs, such as hemophilia or other clotting disorders. BEFORE THE TRANSFUSION  Who gives blood for transfusions?   Healthy volunteers who are fully evaluated to make sure their blood is safe. This is blood bank blood. Transfusion therapy is the safest it has ever been in the practice of medicine.  Before blood is taken from a donor, a complete history is taken to make sure that person has no history of diseases nor engages in risky social behavior (examples are intravenous drug use or sexual activity with multiple partners). The donor's travel history is screened to minimize risk of transmitting infections, such as malaria. The donated blood is tested for signs of infectious diseases, such as HIV and hepatitis. The blood is then tested to be sure it is compatible with you in order to minimize the chance of a transfusion reaction. If you or a relative donates blood, this is often done in anticipation of surgery and is not appropriate for emergency situations. It takes many days to process the donated blood. RISKS AND COMPLICATIONS Although transfusion therapy is very safe and saves many lives, the main dangers of transfusion include:   Getting an infectious disease.  Developing a transfusion reaction. This is an allergic reaction to something in the blood you were given. Every precaution is taken to prevent this. The decision to have a blood transfusion has been considered carefully by your caregiver before blood is given. Blood is not given unless the benefits outweigh the risks. AFTER THE TRANSFUSION  Right after receiving a blood transfusion, you will usually feel much better and more energetic. This is especially true if your red blood  cells have gotten low (anemic). The transfusion raises the level of the red blood cells which carry oxygen, and this usually causes an energy increase.  The nurse administering the transfusion will monitor you carefully for complications. HOME CARE INSTRUCTIONS  No special instructions are needed after a transfusion. You may find your energy is better. Speak with your caregiver about any limitations on activity for underlying diseases you may have. SEEK MEDICAL CARE IF:   Your condition is not improving after your transfusion.  You develop redness or  irritation at the intravenous (IV) site. SEEK IMMEDIATE MEDICAL CARE IF:  Any of the following symptoms occur over the next 12 hours:  Shaking chills.  You have a temperature by mouth above 102 F (38.9 C), not controlled by medicine.  Chest, back, or muscle pain.  People around you feel you are not acting correctly or are confused.  Shortness of breath or difficulty breathing.  Dizziness and fainting.  You get a rash or develop hives.  You have a decrease in urine output.  Your urine turns a dark color or changes to pink, red, or brown. Any of the following symptoms occur over the next 10 days:  You have a temperature by mouth above 102 F (38.9 C), not controlled by medicine.  Shortness of breath.  Weakness after normal activity.  The white part of the eye turns yellow (jaundice).  You have a decrease in the amount of urine or are urinating less often.  Your urine turns a dark color or changes to pink, red, or brown. Document Released: 06/24/2000 Document Revised: 09/19/2011 Document Reviewed: 02/11/2008 [facility] Patient Information 2014 [place], [place].  _______________________________________________________________________

## 2020-09-01 NOTE — Progress Notes (Addendum)
COVID Vaccine Completed:  x3 Date COVID Vaccine completed:09-12-19 10-03-19 Has received booster:  12-24-19 COVID vaccine manufacturer: Pfizer      Date of COVID positive in last 90 days:  N/A  PCP - Reynold Bowen, MD Cardiologist - Jenkins Rouge, MD  Chest x-ray - N/A EKG - 07-29-20 Epic Stress Test - 12-09-14 Epic ECHO - 05-02-19 Epic Cardiac Cath -  Pacemaker/ICD device last checked:  Sleep Study - N/A CPAP -   Fasting Blood Sugar - N/A Checks Blood Sugar _____ times a day  Blood Thinner Instructions: Aspirin Instructions:  ASA 81 mg Last Dose:  Activity level:  Can go up a flight of stairs and perform activities of daily living without stopping and without symptoms      Anesthesia review: Hx of chest pain with cardiology eval.  Grade 2 diastolic dysfunction,  HTN  Patient denies shortness of breath, fever, cough and chest pain at PAT appointment   Patient verbalized understanding of instructions that were given to them at the PAT appointment. Patient was also instructed that they will need to review over the PAT instructions again at home before surgery.

## 2020-09-03 ENCOUNTER — Other Ambulatory Visit: Payer: Self-pay

## 2020-09-03 ENCOUNTER — Encounter (HOSPITAL_COMMUNITY): Payer: Self-pay

## 2020-09-03 ENCOUNTER — Encounter (HOSPITAL_COMMUNITY)
Admission: RE | Admit: 2020-09-03 | Discharge: 2020-09-03 | Disposition: A | Discharge status: Home / Self Care | Payer: Medicare Other | Source: Ambulatory Visit | Attending: Specialist | Admitting: Specialist

## 2020-09-03 DIAGNOSIS — Z01812 Encounter for preprocedural laboratory examination: Secondary | ICD-10-CM | POA: Diagnosis present

## 2020-09-03 LAB — CBC
HCT: 37.4 % (ref 36.0–46.0)
Hemoglobin: 12.4 g/dL (ref 12.0–15.0)
MCH: 30.6 pg (ref 26.0–34.0)
MCHC: 33.2 g/dL (ref 30.0–36.0)
MCV: 92.3 fL (ref 80.0–100.0)
Platelets: 315 10*3/uL (ref 150–400)
RBC: 4.05 MIL/uL (ref 3.87–5.11)
RDW: 12.2 % (ref 11.5–15.5)
WBC: 5.5 10*3/uL (ref 4.0–10.5)
nRBC: 0 % (ref 0.0–0.2)

## 2020-09-03 LAB — APTT: aPTT: 27 seconds (ref 24–36)

## 2020-09-03 LAB — SURGICAL PCR SCREEN
MRSA, PCR: NEGATIVE
Staphylococcus aureus: NEGATIVE

## 2020-09-03 LAB — COMPREHENSIVE METABOLIC PANEL
ALT: 14 U/L (ref 0–44)
AST: 20 U/L (ref 15–41)
Albumin: 4.1 g/dL (ref 3.5–5.0)
Alkaline Phosphatase: 63 U/L (ref 38–126)
Anion gap: 9 (ref 5–15)
BUN: 17 mg/dL (ref 8–23)
CO2: 27 mmol/L (ref 22–32)
Calcium: 9.6 mg/dL (ref 8.9–10.3)
Chloride: 100 mmol/L (ref 98–111)
Creatinine, Ser: 1.28 mg/dL — ABNORMAL HIGH (ref 0.44–1.00)
GFR, Estimated: 44 mL/min — ABNORMAL LOW (ref >60)
Glucose, Bld: 105 mg/dL — ABNORMAL HIGH (ref 70–99)
Potassium: 3.7 mmol/L (ref 3.5–5.1)
Sodium: 136 mmol/L (ref 135–145)
Total Bilirubin: 0.8 mg/dL (ref 0.3–1.2)
Total Protein: 6.7 g/dL (ref 6.5–8.1)

## 2020-09-03 LAB — PROTIME-INR
INR: 1 (ref 0.8–1.2)
Prothrombin Time: 12.5 seconds (ref 11.4–15.2)

## 2020-09-05 ENCOUNTER — Other Ambulatory Visit (HOSPITAL_COMMUNITY)
Admission: RE | Admit: 2020-09-05 | Discharge: 2020-09-05 | Disposition: A | Discharge status: Home / Self Care | Payer: Medicare Other | Source: Ambulatory Visit | Attending: Specialist | Admitting: Specialist

## 2020-09-05 DIAGNOSIS — Z20822 Contact with and (suspected) exposure to covid-19: Secondary | ICD-10-CM | POA: Diagnosis not present

## 2020-09-05 DIAGNOSIS — Z01812 Encounter for preprocedural laboratory examination: Secondary | ICD-10-CM | POA: Insufficient documentation

## 2020-09-05 LAB — SARS CORONAVIRUS 2 (TAT 6-24 HRS): SARS Coronavirus 2: NEGATIVE

## 2020-09-08 MED ORDER — BUPIVACAINE LIPOSOME 1.3 % IJ SUSP
20.0000 mL | Freq: Once | INTRAMUSCULAR | Status: DC
  Filled 2020-09-08: qty 20

## 2020-09-08 NOTE — Anesthesia Preprocedure Evaluation (Addendum)
Anesthesia Evaluation  Patient identified by MRN, date of birth, ID band Patient awake    Reviewed: Allergy & Precautions, NPO status , Patient's Chart, lab work & pertinent test results  History of Anesthesia Complications Negative for: history of anesthetic complications  Airway Mallampati: II  TM Distance: >3 FB Neck ROM: Full    Dental  (+) Dental Advisory Given, Teeth Intact   Pulmonary former smoker,  09/05/2020 SARS coronavirus NEG   breath sounds clear to auscultation       Cardiovascular hypertension, Pt. on medications (-) angina+ Peripheral Vascular Disease   Rhythm:Regular Rate:Normal  '20 ECHO: EF 60-65%, no significant valvular abnormalities   Neuro/Psych  Headaches, Depression    GI/Hepatic Neg liver ROS, GERD  Medicated and Controlled,  Endo/Other  obese  Renal/GU negative Renal ROS     Musculoskeletal  (+) Arthritis , Fibromyalgia -  Abdominal (+) + obese,   Peds  Hematology negative hematology ROS (+)   Anesthesia Other Findings   Reproductive/Obstetrics                            Anesthesia Physical Anesthesia Plan  ASA: III  Anesthesia Plan: Spinal   Post-op Pain Management:  Regional for Post-op pain   Induction:   PONV Risk Score and Plan: 2 and Ondansetron and Treatment may vary due to age or medical condition  Airway Management Planned: Natural Airway and Simple Face Mask  Additional Equipment: None  Intra-op Plan:   Post-operative Plan:   Informed Consent: I have reviewed the patients History and Physical, chart, labs and discussed the procedure including the risks, benefits and alternatives for the proposed anesthesia with the patient or authorized representative who has indicated his/her understanding and acceptance.     Dental advisory given  Plan Discussed with: CRNA and Surgeon  Anesthesia Plan Comments:        Anesthesia Quick  Evaluation

## 2020-09-09 ENCOUNTER — Ambulatory Visit (HOSPITAL_COMMUNITY): Payer: Medicare Other | Admitting: Physician Assistant

## 2020-09-09 ENCOUNTER — Observation Stay (HOSPITAL_COMMUNITY)
Admission: RE | Admit: 2020-09-09 | Discharge: 2020-09-12 | Disposition: A | Discharge status: Home / Self Care | Payer: Medicare Other | Source: Other Acute Inpatient Hospital | Attending: Specialist | Admitting: Specialist

## 2020-09-09 ENCOUNTER — Encounter (HOSPITAL_COMMUNITY)
Admission: RE | Disposition: A | Discharge status: Home / Self Care | Payer: Self-pay | Source: Other Acute Inpatient Hospital | Attending: Specialist

## 2020-09-09 ENCOUNTER — Ambulatory Visit (HOSPITAL_COMMUNITY): Payer: Medicare Other | Admitting: Anesthesiology

## 2020-09-09 ENCOUNTER — Encounter (HOSPITAL_COMMUNITY): Payer: Self-pay | Admitting: Specialist

## 2020-09-09 ENCOUNTER — Other Ambulatory Visit: Payer: Self-pay

## 2020-09-09 DIAGNOSIS — M1711 Unilateral primary osteoarthritis, right knee: Principal | ICD-10-CM | POA: Insufficient documentation

## 2020-09-09 DIAGNOSIS — Z79899 Other long term (current) drug therapy: Secondary | ICD-10-CM | POA: Diagnosis not present

## 2020-09-09 DIAGNOSIS — I10 Essential (primary) hypertension: Secondary | ICD-10-CM | POA: Diagnosis not present

## 2020-09-09 DIAGNOSIS — M25561 Pain in right knee: Secondary | ICD-10-CM | POA: Diagnosis present

## 2020-09-09 DIAGNOSIS — Z7982 Long term (current) use of aspirin: Secondary | ICD-10-CM | POA: Insufficient documentation

## 2020-09-09 HISTORY — PX: TOTAL KNEE ARTHROPLASTY: SHX125

## 2020-09-09 LAB — CBC
HCT: 35.4 % — ABNORMAL LOW (ref 36.0–46.0)
Hemoglobin: 11.4 g/dL — ABNORMAL LOW (ref 12.0–15.0)
MCH: 30.8 pg (ref 26.0–34.0)
MCHC: 32.2 g/dL (ref 30.0–36.0)
MCV: 95.7 fL (ref 80.0–100.0)
Platelets: 278 10*3/uL (ref 150–400)
RBC: 3.7 MIL/uL — ABNORMAL LOW (ref 3.87–5.11)
RDW: 12.4 % (ref 11.5–15.5)
WBC: 8.7 10*3/uL (ref 4.0–10.5)
nRBC: 0 % (ref 0.0–0.2)

## 2020-09-09 LAB — ABO/RH: ABO/RH(D): A POS

## 2020-09-09 LAB — TYPE AND SCREEN
ABO/RH(D): A POS
Antibody Screen: NEGATIVE

## 2020-09-09 LAB — CREATININE, SERUM
Creatinine, Ser: 1.15 mg/dL — ABNORMAL HIGH (ref 0.44–1.00)
GFR, Estimated: 50 mL/min — ABNORMAL LOW (ref >60)

## 2020-09-09 SURGERY — ARTHROPLASTY, KNEE, TOTAL
Anesthesia: Spinal | Site: Knee | Laterality: Right

## 2020-09-09 MED ORDER — IRRISEPT - 450ML BOTTLE WITH 0.05% CHG IN STERILE WATER, USP 99.95% OPTIME
TOPICAL | Status: DC | PRN
  Administered 2020-09-09: 450 mL

## 2020-09-09 MED ORDER — TRAMADOL HCL 50 MG PO TABS
50.0000 mg | ORAL_TABLET | Freq: Four times a day (QID) | ORAL | Status: DC
  Administered 2020-09-09 – 2020-09-12 (×8): 50 mg via ORAL
  Filled 2020-09-09 (×9): qty 1

## 2020-09-09 MED ORDER — OXYCODONE HCL 5 MG PO TABS: 5.0000 mg | ORAL_TABLET | ORAL | 0 refills | Status: AC | PRN

## 2020-09-09 MED ORDER — MIDAZOLAM HCL 2 MG/2ML IJ SOLN
INTRAMUSCULAR | Status: AC
  Filled 2020-09-09: qty 2

## 2020-09-09 MED ORDER — LIDOCAINE HCL (PF) 2 % IJ SOLN
INTRAMUSCULAR | Status: AC
  Filled 2020-09-09: qty 5

## 2020-09-09 MED ORDER — ORAL CARE MOUTH RINSE: 15.0000 mL | Freq: Once | OROMUCOSAL | Status: AC

## 2020-09-09 MED ORDER — OXYCODONE HCL 5 MG PO TABS
ORAL_TABLET | ORAL | Status: AC
  Filled 2020-09-09: qty 1

## 2020-09-09 MED ORDER — CEPHALEXIN 500 MG PO CAPS: 500.0000 mg | ORAL_CAPSULE | Freq: Four times a day (QID) | ORAL | 0 refills | Status: AC

## 2020-09-09 MED ORDER — ONDANSETRON HCL 4 MG/2ML IJ SOLN: 4.0000 mg | Freq: Four times a day (QID) | INTRAMUSCULAR | Status: DC | PRN

## 2020-09-09 MED ORDER — MAGNESIUM CITRATE PO SOLN: 1.0000 | Freq: Once | ORAL | Status: DC | PRN

## 2020-09-09 MED ORDER — DEXAMETHASONE SODIUM PHOSPHATE 10 MG/ML IJ SOLN: 8.0000 mg | Freq: Once | INTRAMUSCULAR | Status: DC

## 2020-09-09 MED ORDER — GABAPENTIN 300 MG PO CAPS
300.0000 mg | ORAL_CAPSULE | Freq: Three times a day (TID) | ORAL | Status: DC
  Administered 2020-09-09 – 2020-09-12 (×10): 300 mg via ORAL
  Filled 2020-09-09 (×10): qty 1

## 2020-09-09 MED ORDER — OXYMETAZOLINE HCL 0.05 % NA SOLN: 1.0000 | Freq: Two times a day (BID) | NASAL | Status: DC | PRN

## 2020-09-09 MED ORDER — SODIUM CHLORIDE (PF) 0.9 % IJ SOLN
INTRAMUSCULAR | Status: AC
  Filled 2020-09-09: qty 60

## 2020-09-09 MED ORDER — CEFAZOLIN SODIUM-DEXTROSE 2-4 GM/100ML-% IV SOLN
2.0000 g | INTRAVENOUS | Status: AC
  Administered 2020-09-09: 2 g via INTRAVENOUS
  Filled 2020-09-09: qty 100

## 2020-09-09 MED ORDER — CHLORHEXIDINE GLUCONATE 0.12 % MT SOLN
15.0000 mL | Freq: Once | OROMUCOSAL | Status: AC
  Administered 2020-09-09: 15 mL via OROMUCOSAL

## 2020-09-09 MED ORDER — APOAEQUORIN 20 MG PO CAPS: 20.0000 mg | ORAL_CAPSULE | Freq: Every day | ORAL | Status: DC

## 2020-09-09 MED ORDER — PANTOPRAZOLE SODIUM 40 MG PO TBEC
40.0000 mg | DELAYED_RELEASE_TABLET | Freq: Every day | ORAL | Status: DC
  Administered 2020-09-10 – 2020-09-12 (×3): 40 mg via ORAL
  Filled 2020-09-09 (×3): qty 1

## 2020-09-09 MED ORDER — PROPOFOL 10 MG/ML IV BOLUS
INTRAVENOUS | Status: AC
  Filled 2020-09-09: qty 20

## 2020-09-09 MED ORDER — CEFAZOLIN SODIUM-DEXTROSE 1-4 GM/50ML-% IV SOLN
1.0000 g | Freq: Three times a day (TID) | INTRAVENOUS | Status: AC
  Administered 2020-09-09 – 2020-09-10 (×2): 1 g via INTRAVENOUS
  Filled 2020-09-09 (×2): qty 50

## 2020-09-09 MED ORDER — PROPOFOL 500 MG/50ML IV EMUL
INTRAVENOUS | Status: DC | PRN
  Administered 2020-09-09: 25 ug/kg/min via INTRAVENOUS

## 2020-09-09 MED ORDER — OXYCODONE HCL 5 MG PO TABS
10.0000 mg | ORAL_TABLET | ORAL | Status: DC | PRN
  Administered 2020-09-09: 10 mg via ORAL

## 2020-09-09 MED ORDER — SODIUM CHLORIDE 0.9 % IV SOLN: INTRAVENOUS | Status: DC

## 2020-09-09 MED ORDER — PROPOFOL 500 MG/50ML IV EMUL
INTRAVENOUS | Status: DC | PRN
  Administered 2020-09-09 (×2): 30 mg via INTRAVENOUS

## 2020-09-09 MED ORDER — LIDOCAINE HCL (CARDIAC) PF 100 MG/5ML IV SOSY
PREFILLED_SYRINGE | INTRAVENOUS | Status: DC | PRN
  Administered 2020-09-09: 30 mg via INTRATRACHEAL

## 2020-09-09 MED ORDER — FENTANYL CITRATE (PF) 100 MCG/2ML IJ SOLN
INTRAMUSCULAR | Status: AC
  Filled 2020-09-09: qty 2

## 2020-09-09 MED ORDER — METHOCARBAMOL 500 MG PO TABS: 500.0000 mg | ORAL_TABLET | Freq: Four times a day (QID) | ORAL | 0 refills | Status: AC

## 2020-09-09 MED ORDER — LACTATED RINGERS IV SOLN: INTRAVENOUS | Status: DC

## 2020-09-09 MED ORDER — POVIDONE-IODINE 10 % EX SWAB
2.0000 "application " | Freq: Once | CUTANEOUS | Status: AC
  Administered 2020-09-09: 2 via TOPICAL

## 2020-09-09 MED ORDER — ACETAMINOPHEN 500 MG PO TABS
1000.0000 mg | ORAL_TABLET | Freq: Once | ORAL | Status: AC
  Administered 2020-09-09: 1000 mg via ORAL
  Filled 2020-09-09: qty 2

## 2020-09-09 MED ORDER — HYDROCHLOROTHIAZIDE 25 MG PO TABS
25.0000 mg | ORAL_TABLET | Freq: Every day | ORAL | Status: DC
  Administered 2020-09-11 – 2020-09-12 (×2): 25 mg via ORAL
  Filled 2020-09-09 (×3): qty 1

## 2020-09-09 MED ORDER — METHOCARBAMOL 500 MG PO TABS
500.0000 mg | ORAL_TABLET | Freq: Four times a day (QID) | ORAL | Status: DC | PRN
  Administered 2020-09-09 – 2020-09-11 (×4): 500 mg via ORAL
  Filled 2020-09-09 (×4): qty 1

## 2020-09-09 MED ORDER — ONDANSETRON HCL 4 MG/2ML IJ SOLN
INTRAMUSCULAR | Status: DC | PRN
  Administered 2020-09-09: 4 mg via INTRAVENOUS

## 2020-09-09 MED ORDER — OXYCODONE HCL 5 MG PO TABS
5.0000 mg | ORAL_TABLET | Freq: Once | ORAL | Status: AC | PRN
Start: 2020-09-09 — End: 2020-09-09
  Administered 2020-09-09: 5 mg via ORAL

## 2020-09-09 MED ORDER — ROSUVASTATIN CALCIUM 5 MG PO TABS
5.0000 mg | ORAL_TABLET | Freq: Every day | ORAL | Status: DC
  Administered 2020-09-10 – 2020-09-12 (×3): 5 mg via ORAL
  Filled 2020-09-09 (×3): qty 1

## 2020-09-09 MED ORDER — DIPHENHYDRAMINE HCL 12.5 MG/5ML PO ELIX: 12.5000 mg | ORAL_SOLUTION | ORAL | Status: DC | PRN

## 2020-09-09 MED ORDER — SODIUM CHLORIDE 0.9 % IR SOLN
Status: DC | PRN
  Administered 2020-09-09: 3000 mL

## 2020-09-09 MED ORDER — ASPIRIN EC 81 MG PO TBEC: 81.0000 mg | DELAYED_RELEASE_TABLET | Freq: Two times a day (BID) | ORAL | 11 refills | Status: DC

## 2020-09-09 MED ORDER — ENOXAPARIN SODIUM 40 MG/0.4ML ~~LOC~~ SOLN
40.0000 mg | SUBCUTANEOUS | Status: DC
  Administered 2020-09-10 – 2020-09-12 (×3): 40 mg via SUBCUTANEOUS
  Filled 2020-09-09 (×3): qty 0.4

## 2020-09-09 MED ORDER — CEFAZOLIN SODIUM-DEXTROSE 1-4 GM/50ML-% IV SOLN
INTRAVENOUS | Status: AC
  Administered 2020-09-09: 1000 mg
  Filled 2020-09-09: qty 50

## 2020-09-09 MED ORDER — ACETAMINOPHEN 325 MG PO TABS
325.0000 mg | ORAL_TABLET | Freq: Four times a day (QID) | ORAL | Status: DC | PRN
  Filled 2020-09-09: qty 2

## 2020-09-09 MED ORDER — METHOCARBAMOL 500 MG IVPB - SIMPLE MED
500.0000 mg | Freq: Four times a day (QID) | INTRAVENOUS | Status: DC | PRN
  Filled 2020-09-09: qty 50

## 2020-09-09 MED ORDER — PROPOFOL 500 MG/50ML IV EMUL
INTRAVENOUS | Status: AC
  Filled 2020-09-09: qty 50

## 2020-09-09 MED ORDER — ONDANSETRON HCL 4 MG PO TABS: 4.0000 mg | ORAL_TABLET | Freq: Every day | ORAL | 1 refills | Status: AC | PRN

## 2020-09-09 MED ORDER — MIDAZOLAM HCL 5 MG/5ML IJ SOLN
INTRAMUSCULAR | Status: DC | PRN
  Administered 2020-09-09 (×2): 1 mg via INTRAVENOUS

## 2020-09-09 MED ORDER — SODIUM CHLORIDE (PF) 0.9 % IJ SOLN
INTRAMUSCULAR | Status: DC | PRN
  Administered 2020-09-09: 60 mL

## 2020-09-09 MED ORDER — DEXAMETHASONE SODIUM PHOSPHATE 10 MG/ML IJ SOLN
INTRAMUSCULAR | Status: AC
  Filled 2020-09-09: qty 1

## 2020-09-09 MED ORDER — TRANEXAMIC ACID-NACL 1000-0.7 MG/100ML-% IV SOLN
1000.0000 mg | INTRAVENOUS | Status: AC
  Administered 2020-09-09: 1000 mg via INTRAVENOUS
  Filled 2020-09-09: qty 100

## 2020-09-09 MED ORDER — ACETAMINOPHEN 500 MG PO TABS
1000.0000 mg | ORAL_TABLET | Freq: Four times a day (QID) | ORAL | Status: AC
  Administered 2020-09-09 – 2020-09-10 (×3): 1000 mg via ORAL
  Filled 2020-09-09 (×3): qty 2

## 2020-09-09 MED ORDER — LOSARTAN POTASSIUM 50 MG PO TABS
100.0000 mg | ORAL_TABLET | Freq: Every day | ORAL | Status: DC
  Administered 2020-09-11 – 2020-09-12 (×2): 100 mg via ORAL
  Filled 2020-09-09 (×3): qty 2

## 2020-09-09 MED ORDER — FENTANYL CITRATE (PF) 100 MCG/2ML IJ SOLN: 25.0000 ug | INTRAMUSCULAR | Status: DC | PRN

## 2020-09-09 MED ORDER — OXYCODONE HCL 5 MG PO TABS
ORAL_TABLET | ORAL | Status: AC
  Filled 2020-09-09: qty 2

## 2020-09-09 MED ORDER — BUPIVACAINE LIPOSOME 1.3 % IJ SUSP
INTRAMUSCULAR | Status: DC | PRN
  Administered 2020-09-09: 20 mL

## 2020-09-09 MED ORDER — HYDROMORPHONE HCL 1 MG/ML IJ SOLN
0.5000 mg | INTRAMUSCULAR | Status: DC | PRN
  Administered 2020-09-09: 0.5 mg via INTRAVENOUS
  Filled 2020-09-09: qty 1

## 2020-09-09 MED ORDER — ONDANSETRON HCL 4 MG PO TABS: 4.0000 mg | ORAL_TABLET | Freq: Four times a day (QID) | ORAL | Status: DC | PRN

## 2020-09-09 MED ORDER — OXYCODONE HCL 5 MG/5ML PO SOLN: 5.0000 mg | Freq: Once | ORAL | Status: AC | PRN

## 2020-09-09 MED ORDER — ROPIVACAINE HCL 7.5 MG/ML IJ SOLN
INTRAMUSCULAR | Status: DC | PRN
  Administered 2020-09-09: 20 mL via PERINEURAL

## 2020-09-09 MED ORDER — GABAPENTIN 300 MG PO CAPS: 300.0000 mg | ORAL_CAPSULE | Freq: Every day | ORAL | Status: DC

## 2020-09-09 MED ORDER — DULOXETINE HCL 30 MG PO CPEP
30.0000 mg | ORAL_CAPSULE | Freq: Every day | ORAL | Status: DC
  Administered 2020-09-10 – 2020-09-12 (×3): 30 mg via ORAL
  Filled 2020-09-09 (×3): qty 1

## 2020-09-09 MED ORDER — OXYCODONE HCL 5 MG PO TABS
5.0000 mg | ORAL_TABLET | ORAL | Status: DC | PRN
  Administered 2020-09-09 – 2020-09-10 (×3): 10 mg via ORAL
  Filled 2020-09-09 (×3): qty 2

## 2020-09-09 MED ORDER — DEXAMETHASONE SODIUM PHOSPHATE 10 MG/ML IJ SOLN
INTRAMUSCULAR | Status: DC | PRN
  Administered 2020-09-09: 10 mg via INTRAVENOUS

## 2020-09-09 MED ORDER — SENNOSIDES-DOCUSATE SODIUM 8.6-50 MG PO TABS
1.0000 | ORAL_TABLET | Freq: Every evening | ORAL | Status: DC | PRN
  Administered 2020-09-09 – 2020-09-11 (×2): 1 via ORAL
  Filled 2020-09-09 (×2): qty 1

## 2020-09-09 MED ORDER — BISACODYL 5 MG PO TBEC
5.0000 mg | DELAYED_RELEASE_TABLET | Freq: Every day | ORAL | Status: DC | PRN
  Administered 2020-09-11: 5 mg via ORAL
  Filled 2020-09-09: qty 1

## 2020-09-09 MED ORDER — 0.9 % SODIUM CHLORIDE (POUR BTL) OPTIME
TOPICAL | Status: DC | PRN
  Administered 2020-09-09: 1000 mL

## 2020-09-09 MED ORDER — FENTANYL CITRATE (PF) 100 MCG/2ML IJ SOLN
INTRAMUSCULAR | Status: DC | PRN
  Administered 2020-09-09: 50 ug via INTRAVENOUS

## 2020-09-09 MED ORDER — ONDANSETRON HCL 4 MG/2ML IJ SOLN
INTRAMUSCULAR | Status: AC
  Filled 2020-09-09: qty 2

## 2020-09-09 SURGICAL SUPPLY — 68 items
ADH SKN CLS APL DERMABOND .7 (GAUZE/BANDAGES/DRESSINGS) ×1
ATTUNE MED DOME PAT 32 KNEE (Knees) ×2 IMPLANT
ATTUNE PSFEM RTSZ4 NARCEM KNEE (Femur) ×1 IMPLANT
ATTUNE PSRP INSR SZ4 10 KNEE (Insert) ×1 IMPLANT
BAG SPEC THK2 15X12 ZIP CLS (MISCELLANEOUS) ×1
BAG ZIPLOCK 12X15 (MISCELLANEOUS) ×2 IMPLANT
BASE TIBIAL ROT PLAT SZ 3 KNEE (Knees) IMPLANT
BLADE SAG 18X100X1.27 (BLADE) ×2 IMPLANT
BLADE SAW SGTL 11.0X1.19X90.0M (BLADE) ×2 IMPLANT
BNDG CMPR MED 10X6 ELC LF (GAUZE/BANDAGES/DRESSINGS) ×1
BNDG ELASTIC 4X5.8 VLCR STR LF (GAUZE/BANDAGES/DRESSINGS) ×2 IMPLANT
BNDG ELASTIC 6X10 VLCR STRL LF (GAUZE/BANDAGES/DRESSINGS) ×1 IMPLANT
BNDG ELASTIC 6X5.8 VLCR STR LF (GAUZE/BANDAGES/DRESSINGS) ×2 IMPLANT
BOWL SMART MIX CTS (DISPOSABLE) ×2 IMPLANT
BSPLAT TIB 3 CMNT ROT PLAT STR (Knees) ×1 IMPLANT
CEMENT HV SMART SET (Cement) ×2 IMPLANT
COVER SURGICAL LIGHT HANDLE (MISCELLANEOUS) ×2 IMPLANT
COVER WAND RF STERILE (DRAPES) ×2 IMPLANT
CUFF TOURN SGL QUICK 34 (TOURNIQUET CUFF) ×2
CUFF TRNQT CYL 34X4.125X (TOURNIQUET CUFF) ×1 IMPLANT
DECANTER SPIKE VIAL GLASS SM (MISCELLANEOUS) ×2 IMPLANT
DERMABOND ADVANCED (GAUZE/BANDAGES/DRESSINGS) ×1
DERMABOND ADVANCED .7 DNX12 (GAUZE/BANDAGES/DRESSINGS) ×1 IMPLANT
DRAPE U-SHAPE 47X51 STRL (DRAPES) ×2 IMPLANT
DRSG AQUACEL AG ADV 3.5X10 (GAUZE/BANDAGES/DRESSINGS) ×2 IMPLANT
DRSG TEGADERM 4X4.75 (GAUZE/BANDAGES/DRESSINGS) ×2 IMPLANT
DURAPREP 26ML APPLICATOR (WOUND CARE) ×4 IMPLANT
ELECT REM PT RETURN 15FT ADLT (MISCELLANEOUS) ×2 IMPLANT
EVACUATOR 1/8 PVC DRAIN (DRAIN) ×1 IMPLANT
GAUZE SPONGE 2X2 8PLY STRL LF (GAUZE/BANDAGES/DRESSINGS) ×1 IMPLANT
GLOVE SRG 8 PF TXTR STRL LF DI (GLOVE) ×1 IMPLANT
GLOVE SURG LTX SZ8 (GLOVE) ×2 IMPLANT
GLOVE SURG ORTHO LTX SZ9 (GLOVE) ×2 IMPLANT
GLOVE SURG POLYISO LF SZ7 (GLOVE) ×2 IMPLANT
GLOVE SURG UNDER POLY LF SZ7.5 (GLOVE) ×2 IMPLANT
GLOVE SURG UNDER POLY LF SZ8 (GLOVE) ×2
GOWN STRL REUS W/TWL XL LVL3 (GOWN DISPOSABLE) ×4 IMPLANT
HANDPIECE INTERPULSE COAX TIP (DISPOSABLE) ×2
JET LAVAGE IRRISEPT WOUND (IRRIGATION / IRRIGATOR) ×2
KIT TURNOVER KIT A (KITS) ×2 IMPLANT
LAVAGE JET IRRISEPT WOUND (IRRIGATION / IRRIGATOR) ×1 IMPLANT
NS IRRIG 1000ML POUR BTL (IV SOLUTION) ×2 IMPLANT
PACK TOTAL KNEE CUSTOM (KITS) ×2 IMPLANT
PENCIL SMOKE EVACUATOR (MISCELLANEOUS) IMPLANT
PIN DRILL FIX HALF THREAD (BIT) ×2 IMPLANT
PIN STEINMAN FIXATION KNEE (PIN) ×1 IMPLANT
PROTECTOR NERVE ULNAR (MISCELLANEOUS) ×2 IMPLANT
SET HNDPC FAN SPRY TIP SCT (DISPOSABLE) ×1 IMPLANT
SET PAD KNEE POSITIONER (MISCELLANEOUS) ×2 IMPLANT
SPONGE GAUZE 2X2 STER 10/PKG (GAUZE/BANDAGES/DRESSINGS) ×1
SPONGE LAP 18X18 RF (DISPOSABLE) IMPLANT
SPONGE SURGIFOAM ABS GEL 100 (HEMOSTASIS) ×2 IMPLANT
STOCKINETTE 6  STRL (DRAPES) ×2
STOCKINETTE 6 STRL (DRAPES) ×1 IMPLANT
SUT BONE WAX W31G (SUTURE) IMPLANT
SUT MNCRL AB 3-0 PS2 18 (SUTURE) ×2 IMPLANT
SUT VIC AB 1 CT1 27 (SUTURE) ×6
SUT VIC AB 1 CT1 27XBRD ANTBC (SUTURE) ×3 IMPLANT
SUT VIC AB 2-0 CT1 27 (SUTURE) ×4
SUT VIC AB 2-0 CT1 TAPERPNT 27 (SUTURE) ×2 IMPLANT
SUT VLOC 180 0 24IN GS25 (SUTURE) ×2 IMPLANT
SYR 50ML LL SCALE MARK (SYRINGE) ×1 IMPLANT
TAPE STRIPS DRAPE STRL (GAUZE/BANDAGES/DRESSINGS) ×2 IMPLANT
TIBIAL BASE ROT PLAT SZ 3 KNEE (Knees) ×2 IMPLANT
TRAY CATH INTERMITTENT SS 16FR (CATHETERS) ×2 IMPLANT
WATER STERILE IRR 1000ML POUR (IV SOLUTION) ×4 IMPLANT
WRAP KNEE MAXI GEL POST OP (GAUZE/BANDAGES/DRESSINGS) ×2 IMPLANT
YANKAUER SUCT BULB TIP NO VENT (SUCTIONS) ×1 IMPLANT

## 2020-09-09 NOTE — Op Note (Signed)
DATE OF SURGERY:  09/09/2020  TIME: 10:49 AM  PATIENT NAME:  [name]    AGE: 75 y.o.   PRE-OPERATIVE DIAGNOSIS:  Right knee osteoarthritis  POST-OPERATIVE DIAGNOSIS:  Right knee osteoarthritis  PROCEDURE:  Procedure(s): TOTAL KNEE ARTHROPLASTY  SURGEON:  COLLINS,ROBERT ANDREW  ASSISTANT:  Leeanne Haus, PA-C, present and scrubbed throughout the case, critical for assistance with exposure, retraction, instrumentation, and closure.  OPERATIVE IMPLANTS: Depuy PFC Attune Rotating Platform.  Femur size 4, Tibia size 3, Patella size 32 3-peg oval button, with a 10 mm polyethylene insert.   PREOPERATIVE INDICATIONS:   [name] is a 75 y.o. year old female with end stage bone on bone arthritis of the knee who failed conservative treatment and elected for Total Knee Arthroplasty.   The risks, benefits, and alternatives were discussed at length including but not limited to the risks of infection, bleeding, nerve injury, stiffness, blood clots, the need for revision surgery, cardiopulmonary complications, among others, and they were willing to proceed.  OPERATIVE DESCRIPTION:  The patient was brought to the operative room and placed in a supine position.  Spinal anesthesia was administered.  IV antibiotics were given.  The lower extremity was prepped and draped in the usual sterile fashion.  Time out was performed.  The leg was elevated and exsanguinated and the tourniquet was inflated.  Anterior quadriceps tendon splitting approach was performed.  The patella was retracted and osteophytes were removed.  The anterior horn of the medial and lateral meniscus was removed and cruciate ligaments resected.   The distal femur was opened with the drill and the intramedullary distal femoral cutting jig was utilized, set at 5 degrees resecting 10 mm off the distal femur.  Care was taken to protect the collateral ligaments.  The distal femoral sizing jig was applied, taking care to  avoid notching.  Then the 4-in-1 cutting jig was applied and the anterior and posterior femur was cut, along with the chamfer cuts.    Then the extramedullary tibial cutting jig was utilized making the appropriate cut using the anterior tibial crest as a reference building in appropriate posterior slope.  Care was taken during the cut to protect the medial and collateral ligaments.  The proximal tibia was removed along with the posterior horns of the menisci.   The posterior medial femoral osteophytes and posterior lateral femoral osteophytes were removed.    The flexion gap was then measured and was symmetric with the extension gap, measured at 10.  I completed the distal femoral preparation using the appropriate jig to prepare the box.  The patella was then measured, and cut with the saw.    The proximal tibia sized and prepared accordingly with the reamer and the punch, and then all components were trialed with the trial insert.  The knee was found to have excellent balance and full motion.    The above named components were then cemented into place and all excess cement was removed.  The trial polyethylene component was in place during cementation, and then was exchanged for the real polyethylene component.    The knee was easily taken through a range of motion and the patella tracked well and the knee irrigated copiously and the parapatellar and subcutaneous tissue closed with vicryl, and monocryl with steri strips for the skin.  The arthrotomy was closed at 90 of flexion. The wounds were dressed with sterile gauze and the tourniquet released and the patient was awakened and returned to the PACU in  stable and satisfactory condition.  There were no complications.  Total tourniquet time was 70 minutes.

## 2020-09-09 NOTE — Anesthesia Postprocedure Evaluation (Signed)
Anesthesia Post Note  Patient: [name]  Procedure(s) Performed: TOTAL KNEE ARTHROPLASTY (Right Knee)     Patient location during evaluation: Phase II Anesthesia Type: Spinal Level of consciousness: awake and alert, patient cooperative and oriented Pain management: pain level controlled Vital Signs Assessment: post-procedure vital signs reviewed and stable Respiratory status: spontaneous breathing, nonlabored ventilation and respiratory function stable Cardiovascular status: blood pressure returned to baseline and stable Postop Assessment: no apparent nausea or vomiting, patient able to bend at knees, spinal receding and adequate PO intake Anesthetic complications: no   No complications documented.  Last Vitals:  Vitals:   09/09/20 1324 09/09/20 1328  BP: 109/61 132/64  Pulse:    Resp:    Temp:    SpO2:      Last Pain:  Vitals:   09/09/20 1200  TempSrc:   PainSc: 0-No pain                 Habiba Treloar,E. Amenda Duclos

## 2020-09-09 NOTE — Progress Notes (Addendum)
Physical Therapy Treatment Patient Details Name: [name] MRN: [id number] DOB: 08/17/1945 Today's Date: 09/09/2020    History of Present Illness patient is a 74y.o. female s/p Rt TKA on 09/09/2020 with PMH significant for peripheral neuropathy, obesity, migranes, HTN, hyperlipemia, GERD, fibromyalgia, depression, chest pain, CAD, and Rt shoulder surgery (2004).    PT Comments    Pt reported increased discomfort in Rt knee this session. Pt required MIN guard and verbal cues for sit to stand transfers. Pt required increased MIN assist- MIN guard for safety with ambulation 44ft 2/2 increased pain in Rt knee.  Pt became fatigued and pale after 55ft requiring seated rest break, BP found to be 138/30mmHG, BP at start of session 156/71. Pt is currently not at safe mobility level for discharge home 2/2 symptoms associated with decrease in BP. Pt will benefit from skilled PT to increase independence and safety with mobility. Acute therapy to follow up during stay to progress functional mobility as able.        Follow Up Recommendations  Follow surgeon's recommendation for DC plan and follow-up therapies;Outpatient PT     Equipment Recommendations  Rolling walker with 5" wheels (youth RW)    Recommendations for Other Services       Precautions / Restrictions Precautions Precautions: Fall Restrictions Weight Bearing Restrictions: No Other Position/Activity Restrictions: WBAT    Mobility  Bed Mobility               General bed mobility comments: pt OOB in recliner    Transfers Overall transfer level: Needs assistance Equipment used: Rolling walker (2 wheeled) Transfers: Sit to/from Stand Sit to Stand: Min guard         General transfer comment: MIN guard for safety with cues for safe hand placement.  Ambulation/Gait Ambulation/Gait assistance: Min assist;Min guard Gait Distance (Feet): 18 Feet Assistive device: Rolling walker (2 wheeled) Gait Pattern/deviations:  Step-to pattern;Decreased stride length;Decreased weight shift to right Gait velocity: decr   General Gait Details: MIN assist -MIN guard for safety and cues for step to gait pattern. Pt requiring increased assist 2/2 increased pain in Rt knee. PT provided cues to use incr WB through UEs when WB on Rt LE for pain control. Pt became fatigued and pale after 48ft requring seated rest break, BP found to be 138/55mmHG, BP at start of session 156/71, RN aware. Improvement in symptoms following reclined rest.   Stairs             Wheelchair Mobility    Modified Rankin (Stroke Patients Only)       Balance Overall balance assessment: Needs assistance Sitting-balance support: Feet supported Sitting balance-Leahy Scale: Good     Standing balance support: During functional activity;Bilateral upper extremity supported Standing balance-Leahy Scale: Poor Standing balance comment: use of RW                            Cognition Arousal/Alertness: Awake/alert Behavior During Therapy: WFL for tasks assessed/performed Overall Cognitive Status: Within Functional Limits for tasks assessed                                        Exercises      General Comments        Pertinent Vitals/Pain Pain Assessment: 0-10 Pain Score: 7  Pain Location: Rt knee Pain Descriptors / Indicators: Sore;Discomfort;Tender;Grimacing Pain Intervention(s):  Limited activity within patient's tolerance;Monitored during session;Repositioned    Home Living                      Prior Function            PT Goals (current goals can now be found in the care plan section) Acute Rehab PT Goals Patient Stated Goal: get back to playing with her pets and doing household tasks PT Goal Formulation: With patient/family Time For Goal Achievement: 09/16/20 Potential to Achieve Goals: Good Progress towards PT goals: Progressing toward goals    Frequency    7X/week      PT  Plan Current plan remains appropriate    Co-evaluation              AM-PAC PT "6 Clicks" Mobility   Outcome Measure  Help needed turning from your back to your side while in a flat bed without using bedrails?: None Help needed moving from lying on your back to sitting on the side of a flat bed without using bedrails?: None Help needed moving to and from a bed to a chair (including a wheelchair)?: A Little Help needed standing up from a chair using your arms (e.g., wheelchair or bedside chair)?: A Little Help needed to walk in hospital room?: A Little Help needed climbing 3-5 steps with a railing? : A Lot 6 Click Score: 19    End of Session Equipment Utilized During Treatment: Gait belt Activity Tolerance: Treatment limited secondary to medical complications (Comment) (decrease in BP, fatigue/weakness) Patient left: in chair;with call bell/phone within reach;with nursing/sitter in room;with family/visitor present Nurse Communication: Mobility status (decrease in BP) PT Visit Diagnosis: Unsteadiness on feet (R26.81);Muscle weakness (generalized) (M62.81);Pain Pain - Right/Left: Right Pain - part of body: Knee     Time: 6789-3810 PT Time Calculation (min) (ACUTE ONLY): 17 min  Charges:                       Elna Breslow, SPT  Acute rehab     Elna Breslow 09/09/2020, 4:11 PM

## 2020-09-09 NOTE — Transfer of Care (Signed)
Immediate Anesthesia Transfer of Care Note  Patient: [name]  Procedure(s) Performed: TOTAL KNEE ARTHROPLASTY (Right Knee)  Patient Location: PACU  Anesthesia Type:Spinal  Level of Consciousness: awake, alert  and patient cooperative  Airway & Oxygen Therapy: Patient Spontanous Breathing  Post-op Assessment: Report given to RN and Post -op Vital signs reviewed and stable  Post vital signs: Reviewed and stable  Last Vitals:  Vitals Value Taken Time  BP 151/68 09/09/20 1115  Temp    Pulse 76 09/09/20 1121  Resp 17 09/09/20 1121  SpO2 98 % 09/09/20 1121  Vitals shown include unvalidated device data.  Last Pain:  Vitals:   09/09/20 0646  TempSrc: Oral  PainSc: 0-No pain         Complications: No complications documented.

## 2020-09-09 NOTE — Anesthesia Procedure Notes (Signed)
Anesthesia Regional Block: Adductor canal block   Pre-Anesthetic Checklist: ,, timeout performed, Correct Patient, Correct Site, Correct Laterality, Correct Procedure, Correct Position, site marked, Risks and benefits discussed,  Surgical consent,  Pre-op evaluation,  At surgeon's request and post-op pain management  Laterality: Right and Lower  Prep: chloraprep       Needles:  Injection technique: Single-shot  Needle Type: Echogenic Needle     Needle Length: 9cm  Needle Gauge: 21     Additional Needles:   Procedures:,,,, ultrasound used (permanent image in chart),,,,  Narrative:  Start time: 09/09/2020 7:51 AM End time: 09/09/2020 7:57 AM Injection made incrementally with aspirations every 5 mL.  Performed by: Personally  Anesthesiologist: Annye Asa, MD  Additional Notes: Pt identified in Holding room.  Monitors applied. Working IV access confirmed. Sterile prep R thigh.  #21ga ECHOgenic Arrow block needle into adductor canal with US guidance.  20cc 0.75% Ropivacaine injected incrementally after negative test dose.  Patient asymptomatic, VSS, no heme aspirated, tolerated well.  Jenita Seashore, MD

## 2020-09-09 NOTE — Evaluation (Signed)
Physical Therapy Evaluation Patient Details Name: [name] MRN: [id number] DOB: August 01, 1945 Today's Date: 09/09/2020   History of Present Illness  patient is a 74y.o. female s/p Rt TKA on 09/09/2020 with PMH significant for peripheral neuropathy, obesity, migranes, HTN, hyperlipemia, GERD, fibromyalgia, depression, chest pain, CAD, and Rt shoulder surgery (2004).  Clinical Impression   Pt is a 74y.o. female s/p Rt TKA POD 0. Pt reports that she is independent with mobility at baseline. Pt required MIN guard and verbal cues for sit to stand transfers. Pt required MIN guard-supervision for ambulation ~19ft requiring x2 seated rest breaks as pt became orthostatic, pale, weak, and fatigued during session. Pt's BP at start of session 167/89 and found to be 150/106mmHG during 1st seated rest decreasing to 109/61 at 2nd seated rest, pt's BP recovered to 132/103mmHg after ~3-67min while reclined in chair. Pt's husband will be available to help at home. Pt is currently not safe for discharge home 2/2 orthostatic hypotension. Pt will benefit from skilled PT to increase independence and safety with mobility. Acute therapy to follow up during stay and progress functional mobility as able.      Follow Up Recommendations Follow surgeon's recommendation for DC plan and follow-up therapies;Outpatient PT    Equipment Recommendations  Rolling walker with 5" wheels (youth RW)    Recommendations for Other Services       Precautions / Restrictions Precautions Precautions: Fall Restrictions Weight Bearing Restrictions: No Other Position/Activity Restrictions: WBAT      Mobility  Bed Mobility Overal bed mobility: Needs Assistance Bed Mobility: Supine to Sit     Supine to sit: Supervision;HOB elevated     General bed mobility comments: supervision for safety with use of B UEs and bed rail to scoot oo EOB.    Transfers Overall transfer level: Needs assistance Equipment used: Rolling walker (2  wheeled) Transfers: Sit to/from Stand Sit to Stand: Min guard         General transfer comment: x3 from EOB, toilet, and recliner; MIN guard for safety and use of B UEs for assist with power up to stand.  Ambulation/Gait Ambulation/Gait assistance: Min guard;Supervision Gait Distance (Feet): 80 Feet (30, 50) Assistive device: Rolling walker (2 wheeled) Gait Pattern/deviations: Step-to pattern;Decreased stride length;Decreased weight shift to right Gait velocity: decr   General Gait Details: MIN guard-supervision for safety with cues for step to gait pattern and RW management with no LOB. Pt's husband demonstrated safe guarding position with cues from therapist. Pt ambulated ~29ft total requiring x2 seated rest breaks 2/2 fatigue and "feeling weak."  Pt's BP at start of session 167/89, 1st seated rest found to be 150/72 and decreased to 109/61 at 2nd seated rest break and pt became pale. Pt's BP improved to 132/64 after ~3-14min reclined rest break.  Stairs            Wheelchair Mobility    Modified Rankin (Stroke Patients Only)       Balance Overall balance assessment: Needs assistance Sitting-balance support: Feet supported Sitting balance-Leahy Scale: Good     Standing balance support: During functional activity;Bilateral upper extremity supported Standing balance-Leahy Scale: Poor Standing balance comment: use of RW                             Pertinent Vitals/Pain Pain Assessment: 0-10 Pain Score: 3  Pain Location: Rt knee Pain Descriptors / Indicators: Sore;Discomfort;Tender Pain Intervention(s): Limited activity within patient's tolerance;Monitored during session;Repositioned  Home Living Family/patient expects to be discharged to:: Private residence Living Arrangements: Spouse/significant other Available Help at Discharge: Family Type of Home: House Home Access: Stairs to enter Entrance Stairs-Rails: None Entrance Stairs-Number of Steps:  2 Home Layout: One level Home Equipment: Cane - quad;Walker - standard;Shower seat;Bedside commode Additional Comments: pt's husband will be available to assist at home    Prior Function Level of Independence: Independent               Hand Dominance   Dominant Hand: Right    Extremity/Trunk Assessment   Upper Extremity Assessment Upper Extremity Assessment: Overall WFL for tasks assessed    Lower Extremity Assessment Lower Extremity Assessment: RLE deficits/detail RLE Deficits / Details: pt with good quad set strength and 4+/5 B dorsi/plantar flexion strength. Pt was able to complete full SLR with no extensor lag. RLE Sensation: WNL RLE Coordination: WNL    Cervical / Trunk Assessment Cervical / Trunk Assessment: Normal  Communication   Communication: No difficulties  Cognition Arousal/Alertness: Awake/alert Behavior During Therapy: WFL for tasks assessed/performed Overall Cognitive Status: Within Functional Limits for tasks assessed                                        General Comments      Exercises     Assessment/Plan    PT Assessment Patient needs continued PT services  PT Problem List Decreased strength;Decreased range of motion;Decreased balance;Decreased activity tolerance;Decreased mobility;Decreased knowledge of use of DME;Pain       PT Treatment Interventions DME instruction;Gait training;Stair training;Functional mobility training;Therapeutic activities;Therapeutic exercise;Balance training;Patient/family education    PT Goals (Current goals can be found in the Care Plan section)  Acute Rehab PT Goals Patient Stated Goal: get back to playing with her pets and doing household tasks PT Goal Formulation: With patient/family Time For Goal Achievement: 09/16/20 Potential to Achieve Goals: Good    Frequency 7X/week   Barriers to discharge        Co-evaluation               AM-PAC PT "6 Clicks" Mobility  Outcome  Measure Help needed turning from your back to your side while in a flat bed without using bedrails?: None Help needed moving from lying on your back to sitting on the side of a flat bed without using bedrails?: None Help needed moving to and from a bed to a chair (including a wheelchair)?: A Little Help needed standing up from a chair using your arms (e.g., wheelchair or bedside chair)?: A Little Help needed to walk in hospital room?: A Little Help needed climbing 3-5 steps with a railing? : A Lot 6 Click Score: 19    End of Session Equipment Utilized During Treatment: Gait belt Activity Tolerance: Treatment limited secondary to medical complications (Comment) (symptomatic orthostasis) Patient left: in chair;with call bell/phone within reach;with nursing/sitter in room;with family/visitor present Nurse Communication: Mobility status (pt became orthostatic) PT Visit Diagnosis: Unsteadiness on feet (R26.81);Muscle weakness (generalized) (M62.81);Pain Pain - Right/Left: Right Pain - part of body: Knee    Time: 7342-8768 PT Time Calculation (min) (ACUTE ONLY): 36 min   Charges:              Elna Breslow, SPT  Acute rehab    Elna Breslow 09/09/2020, 2:08 PM

## 2020-09-09 NOTE — Anesthesia Procedure Notes (Signed)
Procedure Name: MAC Date/Time: 09/09/2020 9:00 AM Performed by: Michele Rockers, CRNA Pre-anesthesia Checklist: Patient identified, Emergency Drugs available, Suction available, Timeout performed and Patient being monitored Patient Re-evaluated:Patient Re-evaluated prior to induction Oxygen Delivery Method: Nasal Cannula

## 2020-09-09 NOTE — Interval H&P Note (Signed)
History and Physical Interval Note:  09/09/2020 10:48 AM  [name]  has presented today for surgery, with the diagnosis of Right knee osteoarthritis.  The various methods of treatment have been discussed with the patient and family. After consideration of risks, benefits and other options for treatment, the patient has consented to  Procedure(s) with comments: TOTAL KNEE ARTHROPLASTY (Right) - adductor canal as a surgical intervention.  The patient's history has been reviewed, patient examined, no change in status, stable for surgery.  I have reviewed the patient's chart and labs.  Questions were answered to the patient's satisfaction.     [name] ANDREW

## 2020-09-09 NOTE — Anesthesia Procedure Notes (Signed)
Spinal  Patient location during procedure: OR End time: 09/09/2020 9:04 AM Staffing Performed: anesthesiologist  Anesthesiologist: Annye Asa, MD Preanesthetic Checklist Completed: patient identified, IV checked, site marked, risks and benefits discussed, surgical consent, monitors and equipment checked, pre-op evaluation and timeout performed Spinal Block Patient position: sitting Prep: DuraPrep and site prepped and draped Patient monitoring: blood pressure, continuous pulse ox, cardiac monitor and heart rate Approach: midline Location: L3-4 Injection technique: single-shot Needle Needle type: Pencan and Introducer  Needle gauge: 24 G Needle length: 9 cm Additional Notes Pt identified in Operating room.  Monitors applied. Working IV access confirmed. Sterile prep, drape lumbar spine.  1% lido local L 3,4.  #24ga Pencan into clear CSF L 3,4.  12mg  0.75% Bupivacaine with dextrose injected with asp CSF beginning and end of injection.  Patient asymptomatic, VSS, no heme aspirated, tolerated well.  Jenita Seashore, MD

## 2020-09-09 NOTE — Progress Notes (Signed)
Patient is recommended to spend the night to get reevaluated per PT.  Elizabeth Sauer, PA informed, and orders placed accordingly

## 2020-09-09 NOTE — Interval H&P Note (Signed)
History and Physical Interval Note:  09/09/2020 8:59 AM  [name]  has presented today for surgery, with the diagnosis of Right knee osteoarthritis.  The various methods of treatment have been discussed with the patient and family. After consideration of risks, benefits and other options for treatment, the patient has consented to  Procedure(s) with comments: TOTAL KNEE ARTHROPLASTY (Right) - adductor canal as a surgical intervention.  The patient's history has been reviewed, patient examined, no change in status, stable for surgery.  I have reviewed the patient's chart and labs.  Questions were answered to the patient's satisfaction.     [name] ANDREW

## 2020-09-10 ENCOUNTER — Encounter (HOSPITAL_COMMUNITY): Payer: Self-pay | Admitting: Specialist

## 2020-09-10 DIAGNOSIS — M1711 Unilateral primary osteoarthritis, right knee: Secondary | ICD-10-CM | POA: Diagnosis not present

## 2020-09-10 MED ORDER — LOSARTAN POTASSIUM 50 MG PO TABS
100.0000 mg | ORAL_TABLET | Freq: Once | ORAL | Status: AC
  Administered 2020-09-10: 100 mg via ORAL
  Filled 2020-09-10: qty 2

## 2020-09-10 MED ORDER — HYDROCODONE-ACETAMINOPHEN 5-325 MG PO TABS
1.0000 | ORAL_TABLET | ORAL | Status: DC | PRN
  Administered 2020-09-10 (×2): 1 via ORAL
  Filled 2020-09-10 (×2): qty 1

## 2020-09-10 MED ORDER — TRAMADOL HCL 50 MG PO TABS: 50.0000 mg | ORAL_TABLET | Freq: Four times a day (QID) | ORAL | 0 refills | Status: AC | PRN

## 2020-09-10 NOTE — TOC Transition Note (Signed)
Transition of Care [facility] - CM/SW Discharge Note   Patient Details  Name: [name] MRN: [id number] Date of Birth: 1945-09-20  Transition of Care [facility]) CM/SW Contact:  Lia Hopping, [place] Phone Number: 09/10/2020, 9:52 AM   Clinical Narrative:    Prearranged therapy plan: OPPT at [facility] Patient youth RW ordered through [place] and delivered to the patient bedside. Patient has elevated toilet seats.  No other needs identified.   Final next level of care: OP Rehab Barriers to Discharge: Barriers Resolved   Patient Goals and CMS Choice        Discharge Placement                       Discharge Plan and Services                DME Arranged: Walker rolling (Youth) DME Agency: [employer] Date DME Agency Contacted: 09/10/20 Time DME Agency Contacted: [phone] Representative spoke with at DME Agency: [place] ([place]) Interventions     Readmission Risk Interventions No flowsheet data found.

## 2020-09-10 NOTE — Progress Notes (Signed)
Physical Therapy Treatment Patient Details Name: [name] MRN: [id number] DOB: 10-05-1945 Today's Date: 09/10/2020    History of Present Illness patient is a 75y.o. female s/p Rt TKA on 09/09/2020 with PMH significant for peripheral neuropathy, obesity, migranes, HTN, hyperlipemia, GERD, fibromyalgia, depression, chest pain, CAD, and Rt shoulder surgery (2004).    PT Comments    Pt was limited with further mobility this session 2/2 Rt knee pain. Pt required MIN guard for sit to stand demonstrating decreased WB on Rt LE to rise. Pt required MIN assist for stability in standing and with stand pivot to to EOB with cues for increased WB through UEs for pain control. PT provided MOD assist for progression of B LEs onto bed during sit to supine transfer. Pt continues to be limited by pain, may need SNF vs.HHPT pending pt progress.  Acute therapy to follow up during stay to progress functional moblity as able to maximize independence.       Follow Up Recommendations  SNF;Home health PT (pt limited by pain, may need SNF vs. HHPT pending progress)     Equipment Recommendations  Rolling walker with 5" wheels (youth RW)    Recommendations for Other Services       Precautions / Restrictions Precautions Precautions: Fall Restrictions Weight Bearing Restrictions: No RLE Weight Bearing: Weight bearing as tolerated    Mobility  Bed Mobility Overal bed mobility: Needs Assistance Bed Mobility: Sit to Supine       Sit to supine: Mod assist   General bed mobility comments: pt OOB in recliner at start of session. Pt required MOD assist for progression of B LEs onto bed with sit to supine transfer.    Transfers Overall transfer level: Needs assistance Equipment used: Rolling walker (2 wheeled) Transfers: Sit to/from [facility] Sit to Stand: Min guard Stand pivot transfers: Min assist       General transfer comment: MIN guard for safety to rise and use of UEs. Pt  demonstrated decreased WB on Rt LE with power up to stand and required MIN assist in standing and with stand pivot to EOB for stability 2/2 increased pain.  Ambulation/Gait                 Stairs             Wheelchair Mobility    Modified Rankin (Stroke Patients Only)       Balance Overall balance assessment: Needs assistance Sitting-balance support: Feet supported Sitting balance-Leahy Scale: Good     Standing balance support: During functional activity;Bilateral upper extremity supported Standing balance-Leahy Scale: Poor Standing balance comment: use of RW                            Cognition Arousal/Alertness: Awake/alert Behavior During Therapy: WFL for tasks assessed/performed Overall Cognitive Status: Within Functional Limits for tasks assessed                                        Exercises Total Joint Exercises Ankle Circles/Pumps: AROM;Both;10 reps;Supine    General Comments        Pertinent Vitals/Pain Pain Assessment: Faces Faces Pain Scale: Hurts worst Pain Location: Rt knee Pain Descriptors / Indicators: Sore;Discomfort;Tender;Grimacing;Crying;Moaning Pain Intervention(s): Limited activity within patient's tolerance;Monitored during session;Premedicated before session;Repositioned;Ice applied;Utilized relaxation techniques    Home Living  Prior Function            PT Goals (current goals can now be found in the care plan section) Acute Rehab PT Goals Patient Stated Goal: get back to playing with her pets and doing household tasks PT Goal Formulation: With patient/family Time For Goal Achievement: 09/16/20 Potential to Achieve Goals: Good Progress towards PT goals: Progressing toward goals    Frequency    7X/week      PT Plan Discharge plan needs to be updated    Co-evaluation              AM-PAC PT "6 Clicks" Mobility   Outcome Measure  Help needed  turning from your back to your side while in a flat bed without using bedrails?: None Help needed moving from lying on your back to sitting on the side of a flat bed without using bedrails?: A Little Help needed moving to and from a bed to a chair (including a wheelchair)?: A Little Help needed standing up from a chair using your arms (e.g., wheelchair or bedside chair)?: A Little Help needed to walk in hospital room?: A Little Help needed climbing 3-5 steps with a railing? : A Lot 6 Click Score: 18    End of Session Equipment Utilized During Treatment: Gait belt Activity Tolerance: Patient limited by pain Patient left: with call bell/phone within reach;in bed;with nursing/sitter in room;with family/visitor present Nurse Communication: Mobility status PT Visit Diagnosis: Unsteadiness on feet (R26.81);Muscle weakness (generalized) (M62.81);Pain Pain - Right/Left: Right Pain - part of body: Knee     Time: 5072-2575 PT Time Calculation (min) (ACUTE ONLY): 20 min  Charges:  $Therapeutic Activity: 8-22 mins                     Eustace Hur, SPT  Acute rehab     Carmin Dibartolo 09/10/2020, 4:21 PM

## 2020-09-10 NOTE — Progress Notes (Signed)
Notified Leeanne Haus that pts bp was 125/60 this am now 112/56, losartan and hydrochlorothiazide not given at this time, no new orders except can hold med at this time.

## 2020-09-10 NOTE — Progress Notes (Signed)
Notified [name] that pt was unable to work well with physical therapy due to pain and is not ready to be discharged. Pt is also requesting to get norco 10 instead of the oxycodone. New orders entered.

## 2020-09-10 NOTE — Progress Notes (Addendum)
Physical Therapy Treatment Patient Details Name: [name] MRN: [id number] DOB: [birth year] Today's Date: 09/10/2020    History of Present Illness patient is a 74y.o. female s/p Rt TKA on 09/09/2020 with PMH significant for peripheral neuropathy, obesity, migranes, HTN, hyperlipemia, GERD, fibromyalgia, depression, chest pain, CAD, and Rt shoulder surgery (2004).    PT Comments    Pt reporting increased discomfort in Rt knee today. Pt required MIN guard and verbal cues for sit to stand transfers. Pt was limited with ambulation distance 2/2 Rt knee pain and required MIN assist for ambulation 107ft with cues for step to gait pattern and increased WB through UEs for pain control. PT reviewed therapeutic exercise for promotion of improved strength and ROM, pt demonstrated understanding. Pt will benefit from skilled PT to increase independence and safety with mobility. Acute therapy to follow up during stay to progress functional mobility as able to ensure safe discharge.     Follow Up Recommendations  Follow surgeon's recommendation for DC plan and follow-up therapies;Outpatient PT     Equipment Recommendations  Rolling walker with 5" wheels (youth RW)    Recommendations for Other Services       Precautions / Restrictions Precautions Precautions: Fall Restrictions Weight Bearing Restrictions: No RLE Weight Bearing: Weight bearing as tolerated    Mobility  Bed Mobility Overal bed mobility: Needs Assistance Bed Mobility: Supine to Sit     Supine to sit: Supervision;HOB elevated     General bed mobility comments: supervision for safety with use of UEs for progression of Rt LEto EOB    Transfers Overall transfer level: Needs assistance Equipment used: Rolling walker (2 wheeled) Transfers: Sit to/from Stand Sit to Stand: Min guard         General transfer comment: MIN guard for safety with cues for safe hand placement.  Ambulation/Gait Ambulation/Gait assistance: Min  assist Gait Distance (Feet): 15 Feet Assistive device: Rolling walker (2 wheeled) Gait Pattern/deviations: Step-to pattern;Decreased stride length;Decreased weight shift to right Gait velocity: decr   General Gait Details: MIN assist for safety 2/2 increased pain in Rt  knee with cues for step to gait pattern. PT provided manual Rt knee block during first 5 steps of gait to promote knee extension as pt demonstrated mild buckling when WB on Rt LE 2/2 increased pain. PT provided cues to use incr WB through UEs when WB on Rt LE for pain control, Rt knee buckling improved.   Stairs             Wheelchair Mobility    Modified Rankin (Stroke Patients Only)       Balance Overall balance assessment: Needs assistance Sitting-balance support: Feet supported Sitting balance-Leahy Scale: Good     Standing balance support: During functional activity;Bilateral upper extremity supported Standing balance-Leahy Scale: Poor Standing balance comment: use of RW                            Cognition Arousal/Alertness: Awake/alert Behavior During Therapy: WFL for tasks assessed/performed Overall Cognitive Status: Within Functional Limits for tasks assessed                                        Exercises Total Joint Exercises Ankle Circles/Pumps: AROM;Both;20 reps;Seated Quad Sets: AROM;Right;5 reps;Seated    General Comments General comments (skin integrity, edema, etc.): BP in supine: 139/70 mmHg; after gait  BP : 130/53 mmHg.      Pertinent Vitals/Pain Pain Assessment: 0-10 Pain Score: 8  Pain Location: Rt knee Pain Descriptors / Indicators: Sore;Discomfort;Tender;Grimacing Pain Intervention(s): Limited activity within patient's tolerance;Monitored during session;Repositioned    Home Living                      Prior Function            PT Goals (current goals can now be found in the care plan section) Acute Rehab PT Goals Patient  Stated Goal: get back to playing with her pets and doing household tasks PT Goal Formulation: With patient/family Time For Goal Achievement: 09/16/20 Potential to Achieve Goals: Good Progress towards PT goals: Progressing toward goals    Frequency    7X/week      PT Plan Current plan remains appropriate    Co-evaluation              AM-PAC PT "6 Clicks" Mobility   Outcome Measure  Help needed turning from your back to your side while in a flat bed without using bedrails?: None Help needed moving from lying on your back to sitting on the side of a flat bed without using bedrails?: None Help needed moving to and from a bed to a chair (including a wheelchair)?: A Little Help needed standing up from a chair using your arms (e.g., wheelchair or bedside chair)?: A Little Help needed to walk in hospital room?: A Little Help needed climbing 3-5 steps with a railing? : A Lot 6 Click Score: 19    End of Session Equipment Utilized During Treatment: Gait belt Activity Tolerance: Patient limited by pain Patient left: in chair;with call bell/phone within reach;with chair alarm set Nurse Communication: Mobility status PT Visit Diagnosis: Unsteadiness on feet (R26.81);Muscle weakness (generalized) (M62.81);Pain Pain - Right/Left: Right Pain - part of body: Knee     Time: 1030-1314 PT Time Calculation (min) (ACUTE ONLY): 25 min  Charges:  $Gait Training: 8-22 mins $Therapeutic Activity: 8-22 mins                     Lauren Youngblood, SPT  Acute rehab     Lauren Youngblood 09/10/2020, 11:58 AM

## 2020-09-10 NOTE — Progress Notes (Signed)
Subjective: 1 Day Post-Op Procedure(s) (LRB): TOTAL KNEE ARTHROPLASTY (Right) Patient reports pain as mild.   Patient was supposed to go home yesterday, difficulty with physical therapy due to orthostatic hypotension.  She was admitted for overnight observation States she is doing well this morning, no complaints Was able to void last night with no issues  Objective: Vital signs in last 24 hours: Temp:  [97.6 F (36.4 C)-98.6 F (37 C)] 98.6 F (37 C) (03/03 0547) Pulse Rate:  [73-99] 99 (03/03 0547) Resp:  [13-20] 16 (03/03 0547) BP: (109-177)/(56-93) 150/65 (03/03 0547) SpO2:  [91 %-100 %] 94 % (03/03 0547) Weight:  [76.2 kg-80 kg] 80 kg (03/02 1713)  Intake/Output from previous day: 03/02 0701 - 03/03 0700 In: [facility] [P.O.:720; I.V.:1000; IV Piggyback:100] Out: 650 [Urine:600; Blood:50] Intake/Output this shift: Total I/O In: -  Out: 300 [Urine:300]  Recent Labs    09/09/20 1124  HGB 11.4*   Recent Labs    09/09/20 1124  WBC 8.7  RBC 3.70*  HCT 35.4*  PLT 278   Recent Labs    09/09/20 1124  CREATININE 1.15*   No results for input(s): LABPT, INR in the last 72 hours.  Neurologically intact Neurovascular intact Sensation intact distally Intact pulses distally Dorsiflexion/Plantar flexion intact Incision: dressing C/D/I No cellulitis present Compartment soft   Assessment/Plan: 1 Day Post-Op Procedure(s) (LRB): TOTAL KNEE ARTHROPLASTY (Right) Up with therapy, if patient is able to get up this morning with no issues, no orthostatic hypotension okay for discharge this afternoon Medications of already been sent to pharmacy She will have DVT prophylaxis in hospital of Lovenox, when she gets home she will do aspirin 81 mg twice a day for DVT prophylaxis starting tomorrow morning Ace bandage can come off on Saturday replaced by Ted stocking, dressing underneath remains on until follow-up in the office in 2 weeks She already has outpatient physical therapy  scheduled    Patient's anticipated LOS is less than 2 midnights, meeting these requirements: - Younger than 56 - Lives within 1 hour of care - Has a competent adult at home to recover with post-op recover - NO history of  - Chronic pain requiring opiods  - Diabetes  - Coronary Artery Disease  - Heart failure  - Heart attack  - Stroke  - DVT/VTE  - Cardiac arrhythmia  - Respiratory Failure/COPD  - Renal failure  - Anemia  - Advanced Liver disease       Derrick Ravel [phone] 09/10/2020, 6:31 AM

## 2020-09-10 NOTE — Progress Notes (Signed)
Notified Leeanne Haus that pts bp is now 171/74 pt is really weak and just feels worn out. New order given for losartan 100mg  now. Notified her that she is also having some trouble with getting strangled when eating or drinking. Will monitor for now. No other new orders.

## 2020-09-10 NOTE — Discharge Summary (Signed)
Physician Discharge Summary  Patient ID: [name] MRN: [id number] DOB/AGE: 01/02/1946 75 y.o.  Admit date: 09/09/2020 Discharge date: 09/10/2020  Admission Diagnoses: Right total knee arthroplasty  Discharge Diagnoses:  Active Problems:   Osteoarthritis of right knee   Discharged Condition: good  Hospital Course: Patient was admitted on 09/09/2020 for a right total knee arthroplasty due to end stage osetoarthritis.  She tolerated surgery well. She was sent to PACU in stable condition.  She was scheduled for same-day discharge, but with physical therapy she suffered some orthostatic hypotension multiple times during ambulation that required rest.  She was admitted for overnight observation. No events overnight. Postop day 1 patient doing well. She was able to get up with PT but will still having some difficulty with PT and ambulation, her BP meds were held due to decreased BP, but as the day went on BP increased so one of her meds was restarted. With continued BP overnight. Postop day 2 both BP meds were restarted. She worked with PT again today and was able to pass and was cleared for DC home. She was sent home with oxycodone, tramadol, methocarbamol and zofran. She will start OPPT and follow up in the office in 2 weeks.   Consults: None  Significant Diagnostic Studies: none  Treatments: IV hydration, antibiotics: Ancef, analgesia: acetaminophen and oxycodone, tramadol, anticoagulation: Lovenox while in hospital, ASA when home, therapies: PT and surgery: right TKA  Discharge Exam: Blood pressure (!) 150/65, pulse 99, temperature 98.6 F (37 C), resp. rate 16, height 5' 1.5" (1.562 m), weight 80 kg, SpO2 94 %. General appearance: alert, cooperative, appears stated age and no distress Extremities: extremities normal, atraumatic, no cyanosis or edema and limited ROM of right lower extremity due to right TKA Pulses: 2+ and symmetric Skin: Skin color, texture, turgor normal. No rashes or  lesions Neurologic: Grossly normal Incision/Wound: dressings clean dry and intact  Disposition: Discharge disposition: 01-Home or Self Care       Discharge Instructions     Call MD / Call 911   Complete by: As directed    If you experience chest pain or shortness of breath, CALL 911 and be transported to the hospital emergency room.  If you develope a fever above 101 F, pus (white drainage) or increased drainage or redness at the wound, or calf pain, call your surgeon's office.   Call MD / Call 911   Complete by: As directed    If you experience chest pain or shortness of breath, CALL 911 and be transported to the hospital emergency room.  If you develope a fever above 101 F, pus (white drainage) or increased drainage or redness at the wound, or calf pain, call your surgeon's office.   Constipation Prevention   Complete by: As directed    Drink plenty of fluids.  Prune juice may be helpful.  You may use a stool softener, such as Colace (over the counter) 100 mg twice a day.  Use MiraLax (over the counter) for constipation as needed.   Constipation Prevention   Complete by: As directed    Drink plenty of fluids.  Prune juice may be helpful.  You may use a stool softener, such as Colace (over the counter) 100 mg twice a day.  Use MiraLax (over the counter) for constipation as needed.   Diet - low sodium heart healthy   Complete by: As directed    Diet - low sodium heart healthy   Complete by: As directed  Discharge instructions   Complete by: As directed    Dr. Sydnee Cabal [facility] [address], [place], [place] [address] [phone]  TOTAL KNEE REPLACEMENT POSTOPERATIVE DIRECTIONS  Knee Rehabilitation, Guidelines Following Surgery  Results after knee surgery are often greatly improved when you follow the exercise, range of motion and muscle strengthening exercises prescribed by your doctor. Safety measures are also important to protect the knee from  further injury. Any time any of these exercises cause you to have increased pain or swelling in your knee joint, decrease the amount until you are comfortable again and slowly increase them. If you have problems or questions, call your caregiver or physical therapist for advice.   HOME CARE INSTRUCTIONS  Remove items at home which could result in a fall. This includes throw rugs or furniture in walking pathways.  ICE to the affected knee every three hours for 30 minutes at a time and then as needed for pain and swelling.  Continue to use ice on the knee for pain and swelling from surgery. You may notice swelling that will progress down to the foot and ankle.  This is normal after surgery.  Elevate the leg when you are not up walking on it.   Continue to use the breathing machine which will help keep your temperature down.  It is common for your temperature to cycle up and down following surgery, especially at night when you are not up moving around and exerting yourself.  The breathing machine keeps your lungs expanded and your temperature down. Do not place pillow under knee, focus on keeping the knee straight while resting   DIET You may resume your previous home diet once your are discharged from the hospital.  DRESSING / WOUND CARE / SHOWERING Keep the surgical dressing until follow up.  The dressing is water proof, but you need to put extra covering over it like plastic wrap.  IF THE DRESSING FALLS OFF or the wound gets wet inside, change the dressing with sterile gauze.  Please use good hand washing techniques before changing the dressing.  Do not use any lotions or creams on the incision until instructed by your surgeon.   You may start showering once you are discharged home but do not submerge the incision under water.  You are sent home with an ACE bandage on over the leg, this can be removed at 3 days from surgery. At this time you can start showering. Please place the white TED stocking on  the surgical leg after. This needs to be worn on the surgical leg for 2 weeks after surgery.   ACTIVITY Walk with your walker as instructed. Use walker as long as suggested by your caregivers. Avoid periods of inactivity such as sitting longer than an hour when not asleep. This helps prevent blood clots.  You may resume a sexual relationship in one month or when given the OK by your doctor.  You may return to work once you are cleared by your doctor.  Do not drive a car for 6 weeks or until released by you surgeon.  Do not drive while taking narcotics.  WEIGHT BEARING Weight bearing as tolerated with assist device (walker, cane, etc) as directed, use it as long as suggested by your surgeon or therapist, typically at least 4-6 weeks.  POSTOPERATIVE CONSTIPATION PROTOCOL Constipation - defined medically as fewer than three stools per week and severe constipation as less than one stool per week.  One of the most  common issues patients have following surgery is constipation.  Even if you have a regular bowel pattern at home, your normal regimen is likely to be disrupted due to multiple reasons following surgery.  Combination of anesthesia, postoperative narcotics, change in appetite and fluid intake all can affect your bowels.  In order to avoid complications following surgery, here are some recommendations in order to help you during your recovery period.  Colace (docusate) - Pick up an over-the-counter form of Colace or another stool softener and take twice a day as long as you are requiring postoperative pain medications.  Take with a full glass of water daily.  If you experience loose stools or diarrhea, hold the colace until you stool forms back up.  If your symptoms do not get better within 1 week or if they get worse, check with your doctor.  Dulcolax (bisacodyl) - Pick up over-the-counter and take as directed by the product packaging as needed to assist with the movement of your bowels.   Take with a full glass of water.  Use this product as needed if not relieved by Colace only.   MiraLax (polyethylene glycol) - Pick up over-the-counter to have on hand.  MiraLax is a solution that will increase the amount of water in your bowels to assist with bowel movements.  Take as directed and can mix with a glass of water, juice, soda, coffee, or tea.  Take if you go more than two days without a movement. Do not use MiraLax more than once per day. Call your doctor if you are still constipated or irregular after using this medication for 7 days in a row.  If you continue to have problems with postoperative constipation, please contact the office for further assistance and recommendations.  If you experience "the worst abdominal pain ever" or develop nausea or vomiting, please contact the office immediatly for further recommendations for treatment.  ITCHING  If you experience itching with your medications, try taking only a single pain pill, or even half a pain pill at a time.  You can also use Benadryl over the counter for itching or also to help with sleep.   TED HOSE STOCKINGS Wear the elastic stockings on both legs for two weeks following surgery during the day but you may remove then at night for sleeping.  Okay to remove ACE in 3 days, put TED on after  MEDICATIONS See your medication summary on the "After Visit Summary" that the nursing staff will review with you prior to discharge.  You may have some home medications which will be placed on hold until you complete the course of blood thinner medication.  It is important for you to complete the blood thinner medication as prescribed by your surgeon.  Continue your approved medications as instructed at time of discharge. If you were not previously taking any blood thinners prior to surgery please start taking Aspirin 325 mg tabs twice daily for 6 weeks. If you are unable to take Aspirin please let your doctor know.   PRECAUTIONS If you  experience chest pain or shortness of breath - call 911 immediately for transfer to the hospital emergency department.  If you develop a fever greater that 101 F, purulent drainage from wound, increased redness or drainage from wound, foul odor from the wound/dressing, or calf pain - CONTACT YOUR SURGEON.  FOLLOW-UP APPOINTMENTS Make sure you keep all of your appointments after your operation with your surgeon and caregivers. You should call the office at the above phone number and make an appointment for approximately two weeks after the date of your surgery or on the date instructed by your surgeon outlined in the "After Visit Summary".   RANGE OF MOTION AND STRENGTHENING EXERCISES  Rehabilitation of the knee is important following a knee injury or an operation. After just a few days of immobilization, the muscles of the thigh which control the knee become weakened and shrink (atrophy). Knee exercises are designed to build up the tone and strength of the thigh muscles and to improve knee motion. Often times heat used for twenty to thirty minutes before working out will loosen up your tissues and help with improving the range of motion but do not use heat for the first two weeks following surgery. These exercises can be done on a training (exercise) mat, on the floor, on a table or on a bed. Use what ever works the best and is most comfortable for you Knee exercises include:  Leg Lifts - While your knee is still immobilized in a splint or cast, you can do straight leg raises. Lift the leg to 60 degrees, hold for 3 sec, and slowly lower the leg. Repeat 10-20 times 2-3 times daily. Perform this exercise against resistance later as your knee gets better.  Quad and Hamstring Sets - Tighten up the muscle on the front of the thigh (Quad) and hold for 5-10 sec. Repeat this 10-20 times hourly. Hamstring sets are done by pushing the foot backward against an object  and holding for 5-10 sec. Repeat as with quad sets.  Leg Slides: Lying on your back, slowly slide your foot toward your buttocks, bending your knee up off the floor (only go as far as is comfortable). Then slowly slide your foot back down until your leg is flat on the floor again. Angel Wings: Lying on your back spread your legs to the side as far apart as you can without causing discomfort.  A rehabilitation program following serious knee injuries can speed recovery and prevent re-injury in the future due to weakened muscles. Contact your doctor or a physical therapist for more information on knee rehabilitation.   IF YOU ARE TRANSFERRED TO A SKILLED REHAB FACILITY If the patient is transferred to a skilled rehab facility following release from the hospital, a list of the current medications will be sent to the facility for the patient to continue.  When discharged from the skilled rehab facility, please have the facility set up the patient's [place] prior to being released. Also, the skilled facility will be responsible for providing the patient with their medications at time of release from the facility to include their pain medication, the muscle relaxants, and their blood thinner medication. If the patient is still at the rehab facility at time of the two week follow up appointment, the skilled rehab facility will also need to assist the patient in arranging follow up appointment in our office and any transportation needs.  MAKE SURE YOU:  Understand these instructions.  Get help right away if you are not doing well or get worse.    Pick up stool softner and laxative for home use following surgery while on pain medications. May shower starting three days after surgery. Please use a clean towel to pat the leg dry following showers. Continue to use ice for pain  and swelling after surgery. Do not use any lotions or creams on the incision until instructed by you Start Aspirin  immediately following surgery.   Discharge instructions   Complete by: As directed    Dr. Sydnee Cabal [facility] [address], [place], [place] [address] [phone]  TOTAL KNEE REPLACEMENT POSTOPERATIVE DIRECTIONS  Knee Rehabilitation, Guidelines Following Surgery  Results after knee surgery are often greatly improved when you follow the exercise, range of motion and muscle strengthening exercises prescribed by your doctor. Safety measures are also important to protect the knee from further injury. Any time any of these exercises cause you to have increased pain or swelling in your knee joint, decrease the amount until you are comfortable again and slowly increase them. If you have problems or questions, call your caregiver or physical therapist for advice.   HOME CARE INSTRUCTIONS  Remove items at home which could result in a fall. This includes throw rugs or furniture in walking pathways.  ICE to the affected knee every three hours for 30 minutes at a time and then as needed for pain and swelling.  Continue to use ice on the knee for pain and swelling from surgery. You may notice swelling that will progress down to the foot and ankle.  This is normal after surgery.  Elevate the leg when you are not up walking on it.   Continue to use the breathing machine which will help keep your temperature down.  It is common for your temperature to cycle up and down following surgery, especially at night when you are not up moving around and exerting yourself.  The breathing machine keeps your lungs expanded and your temperature down. Do not place pillow under knee, focus on keeping the knee straight while resting   DIET You may resume your previous home diet once your are discharged from the hospital.  DRESSING / WOUND CARE / SHOWERING Keep the surgical dressing until follow up.  The dressing is water proof, but you need to put extra covering over it like plastic wrap.  IF THE  DRESSING FALLS OFF or the wound gets wet inside, change the dressing with sterile gauze.  Please use good hand washing techniques before changing the dressing.  Do not use any lotions or creams on the incision until instructed by your surgeon.   You may start showering once you are discharged home but do not submerge the incision under water.  You are sent home with an ACE bandage on over the leg, this can be removed at 3 days from surgery. At this time you can start showering. Please place the white TED stocking on the surgical leg after. This needs to be worn on the surgical leg for 2 weeks after surgery.   ACTIVITY Walk with your walker as instructed. Use walker as long as suggested by your caregivers. Avoid periods of inactivity such as sitting longer than an hour when not asleep. This helps prevent blood clots.  You may resume a sexual relationship in one month or when given the OK by your doctor.  You may return to work once you are cleared by your doctor.  Do not drive a car for 6 weeks or until released by you surgeon.  Do not drive while taking narcotics.  WEIGHT BEARING Weight bearing as tolerated with assist device (walker, cane, etc) as directed, use it as long as suggested by your surgeon or therapist, typically at least 4-6 weeks.  POSTOPERATIVE CONSTIPATION PROTOCOL Constipation -  defined medically as fewer than three stools per week and severe constipation as less than one stool per week.  One of the most common issues patients have following surgery is constipation.  Even if you have a regular bowel pattern at home, your normal regimen is likely to be disrupted due to multiple reasons following surgery.  Combination of anesthesia, postoperative narcotics, change in appetite and fluid intake all can affect your bowels.  In order to avoid complications following surgery, here are some recommendations in order to help you during your recovery period.  Colace (docusate) - Pick up an  over-the-counter form of Colace or another stool softener and take twice a day as long as you are requiring postoperative pain medications.  Take with a full glass of water daily.  If you experience loose stools or diarrhea, hold the colace until you stool forms back up.  If your symptoms do not get better within 1 week or if they get worse, check with your doctor.  Dulcolax (bisacodyl) - Pick up over-the-counter and take as directed by the product packaging as needed to assist with the movement of your bowels.  Take with a full glass of water.  Use this product as needed if not relieved by Colace only.   MiraLax (polyethylene glycol) - Pick up over-the-counter to have on hand.  MiraLax is a solution that will increase the amount of water in your bowels to assist with bowel movements.  Take as directed and can mix with a glass of water, juice, soda, coffee, or tea.  Take if you go more than two days without a movement. Do not use MiraLax more than once per day. Call your doctor if you are still constipated or irregular after using this medication for 7 days in a row.  If you continue to have problems with postoperative constipation, please contact the office for further assistance and recommendations.  If you experience "the worst abdominal pain ever" or develop nausea or vomiting, please contact the office immediatly for further recommendations for treatment.  ITCHING  If you experience itching with your medications, try taking only a single pain pill, or even half a pain pill at a time.  You can also use Benadryl over the counter for itching or also to help with sleep.   TED HOSE STOCKINGS Wear the elastic stockings on both legs for two weeks following surgery during the day but you may remove then at night for sleeping.  Okay to remove ACE in 3 days, put TED on after  MEDICATIONS See your medication summary on the "After Visit Summary" that the nursing staff will review with you prior to  discharge.  You may have some home medications which will be placed on hold until you complete the course of blood thinner medication.  It is important for you to complete the blood thinner medication as prescribed by your surgeon.  Continue your approved medications as instructed at time of discharge. If you were not previously taking any blood thinners prior to surgery please start taking Aspirin 325 mg tabs twice daily for 6 weeks. If you are unable to take Aspirin please let your doctor know.   PRECAUTIONS If you experience chest pain or shortness of breath - call 911 immediately for transfer to the hospital emergency department.  If you develop a fever greater that 101 F, purulent drainage from wound, increased redness or drainage from wound, foul odor from the wound/dressing, or calf pain - CONTACT YOUR SURGEON.  FOLLOW-UP APPOINTMENTS Make sure you keep all of your appointments after your operation with your surgeon and caregivers. You should call the office at the above phone number and make an appointment for approximately two weeks after the date of your surgery or on the date instructed by your surgeon outlined in the "After Visit Summary".   RANGE OF MOTION AND STRENGTHENING EXERCISES  Rehabilitation of the knee is important following a knee injury or an operation. After just a few days of immobilization, the muscles of the thigh which control the knee become weakened and shrink (atrophy). Knee exercises are designed to build up the tone and strength of the thigh muscles and to improve knee motion. Often times heat used for twenty to thirty minutes before working out will loosen up your tissues and help with improving the range of motion but do not use heat for the first two weeks following surgery. These exercises can be done on a training (exercise) mat, on the floor, on a table or on a bed. Use what ever works the best and is most  comfortable for you Knee exercises include:  Leg Lifts - While your knee is still immobilized in a splint or cast, you can do straight leg raises. Lift the leg to 60 degrees, hold for 3 sec, and slowly lower the leg. Repeat 10-20 times 2-3 times daily. Perform this exercise against resistance later as your knee gets better.  Quad and Hamstring Sets - Tighten up the muscle on the front of the thigh (Quad) and hold for 5-10 sec. Repeat this 10-20 times hourly. Hamstring sets are done by pushing the foot backward against an object and holding for 5-10 sec. Repeat as with quad sets.  Leg Slides: Lying on your back, slowly slide your foot toward your buttocks, bending your knee up off the floor (only go as far as is comfortable). Then slowly slide your foot back down until your leg is flat on the floor again. Angel Wings: Lying on your back spread your legs to the side as far apart as you can without causing discomfort.  A rehabilitation program following serious knee injuries can speed recovery and prevent re-injury in the future due to weakened muscles. Contact your doctor or a physical therapist for more information on knee rehabilitation.   IF YOU ARE TRANSFERRED TO A SKILLED REHAB FACILITY If the patient is transferred to a skilled rehab facility following release from the hospital, a list of the current medications will be sent to the facility for the patient to continue.  When discharged from the skilled rehab facility, please have the facility set up the patient's [place] prior to being released. Also, the skilled facility will be responsible for providing the patient with their medications at time of release from the facility to include their pain medication, the muscle relaxants, and their blood thinner medication. If the patient is still at the rehab facility at time of the two week follow up appointment, the skilled rehab facility will also need to assist the patient in arranging  follow up appointment in our office and any transportation needs.  MAKE SURE YOU:  Understand these instructions.  Get help right away if you are not doing well or get worse.    Pick up stool softner and laxative for home use following surgery while on pain medications. May shower starting three days after surgery. Please use a clean towel to pat the leg dry following showers. Continue to use ice for  pain and swelling after surgery. Do not use any lotions or creams on the incision until instructed by you Start Aspirin immediately following surgery.   Do not put a pillow under the knee. Place it under the heel.   Complete by: As directed    Do not put a pillow under the knee. Place it under the heel.   Complete by: As directed    Driving restrictions   Complete by: As directed    No driving for two weeks   Driving restrictions   Complete by: As directed    No driving for two weeks   TED hose   Complete by: As directed    Use stockings (TED hose) for three weeks on both leg(s).  You may remove them at night for sleeping.   TED hose   Complete by: As directed    Use stockings (TED hose) for three weeks on both leg(s).  You may remove them at night for sleeping.   Weight bearing as tolerated   Complete by: As directed    Weight bearing as tolerated   Complete by: As directed       Allergies as of 09/10/2020       Reactions   Mobic [meloxicam] Other (See Comments)   Chest pain    Codeine Nausea And Vomiting   Nitrofuran Derivatives Diarrhea   Penicillins Nausea And Vomiting   Reported by [facility] April 2020 - pt is not sure about this one Did it involve swelling of the face/tongue/throat, SOB, or low BP? Unknown Did it involve sudden or severe rash/hives, skin peeling, or any reaction on the inside of your mouth or nose? Unknown Did you need to seek medical attention at a hospital or doctor's office? Unknown When did it last happen?      unknown If all above answers are  "NO", may proceed with cephalosporin use.   Temazepam Other (See Comments)   Memory loss    Celebrex [celecoxib] Rash   Influenza Vac Split [influenza Virus Vaccine] Rash   Rash on face and body        Medication List     STOP taking these medications    acetaminophen 650 MG CR tablet Commonly known as: TYLENOL   HYDROcodone-acetaminophen 10-325 MG tablet Commonly known as: NORCO       TAKE these medications    aspirin EC 81 MG tablet Take 1 tablet (81 mg total) by mouth in the morning and at bedtime. What changed: when to take this   B-12 2500 MCG Tabs Take 2,500 mcg by mouth 2 (two) times a week.   BLUE-EMU SUPER STRENGTH EX Apply 1 application topically daily as needed (pain).   cephALEXin 500 MG capsule Commonly known as: KEFLEX Take 1 capsule (500 mg total) by mouth 4 (four) times daily for 3 days.   DULoxetine 30 MG capsule Commonly known as: CYMBALTA Take 30 mg by mouth daily.   gabapentin 300 MG capsule Commonly known as: NEURONTIN Take 1 capsule by mouth  twice daily What changed:  how much to take how to take this when to take this additional instructions   hydrochlorothiazide 25 MG tablet Commonly known as: HYDRODIURIL Take 1 tablet (25 mg total) by mouth daily.   losartan 100 MG tablet Commonly known as: COZAAR Take 1 tablet (100 mg total) by mouth daily.   Melatonin 10 MG Caps Take 10 mg by mouth at bedtime.   methocarbamol 500 MG tablet Commonly known as: ROBAXIN  Take 500 mg by mouth daily as needed for muscle spasms. What changed: Another medication with the same name was added. Make sure you understand how and when to take each.   methocarbamol 500 MG tablet Commonly known as: Robaxin Take 1 tablet (500 mg total) by mouth 4 (four) times daily. What changed: You were already taking a medication with the same name, and this prescription was added. Make sure you understand how and when to take each.   omeprazole 40 MG  capsule Commonly known as: PRILOSEC Take 40 mg by mouth every Monday, Wednesday, and Friday.   ondansetron 4 MG tablet Commonly known as: ZOFRAN Take 4 mg by mouth every 6 (six) hours as needed for nausea or vomiting. What changed: Another medication with the same name was added. Make sure you understand how and when to take each.   ondansetron 4 MG tablet Commonly known as: Zofran Take 1 tablet (4 mg total) by mouth daily as needed for nausea or vomiting. What changed: You were already taking a medication with the same name, and this prescription was added. Make sure you understand how and when to take each.   oxyCODONE 5 MG immediate release tablet Commonly known as: Roxicodone Take 1 tablet (5 mg total) by mouth every 4 (four) hours as needed for up to 7 days.   oxymetazoline 0.05 % nasal spray Commonly known as: AFRIN Place 1 spray into both nostrils 2 (two) times daily as needed for congestion.   Prevagen Extra Strength 20 MG Caps Generic drug: Apoaequorin Take 20 mg by mouth daily.   rosuvastatin 5 MG tablet Commonly known as: CRESTOR Take 5 mg by mouth daily.   traMADol 50 MG tablet Commonly known as: Ultram Take 1 tablet (50 mg total) by mouth every 6 (six) hours as needed for up to 7 days.               Discharge Care Instructions  (From admission, onward)           Start     Ordered   09/10/20 0000  Weight bearing as tolerated        09/10/20 0635   09/09/20 0000  Weight bearing as tolerated        09/09/20 1117              Signed: Nettie Elm [facility] [phone] 09/10/2020, 6:35 AM

## 2020-09-11 DIAGNOSIS — M1711 Unilateral primary osteoarthritis, right knee: Secondary | ICD-10-CM | POA: Diagnosis not present

## 2020-09-11 MED ORDER — HYDROCODONE-ACETAMINOPHEN 10-325 MG PO TABS
1.0000 | ORAL_TABLET | ORAL | Status: DC | PRN
  Administered 2020-09-11 – 2020-09-12 (×3): 1 via ORAL
  Filled 2020-09-11 (×3): qty 1

## 2020-09-11 NOTE — Progress Notes (Signed)
Called [facility] to make aware of patient's elevated blood pressures. Latest BP reading is 170/66. Explained to PA about BP meds being held yesterday due to low BP.  No new orders from [facility] [place]. She stated that patient needs to have BP meds that are scheduled later in the AM.  Patient is resting and not showing any signs of pain. Will continue to monitor patient.

## 2020-09-11 NOTE — Progress Notes (Signed)
   09/11/20 1200  PT Visit Information  Last PT Received On 09/11/20  Assistance Needed +1  Pt continues to complain of excessive pain. Unable to amb outside of room d/t c/o pain and weakness. Chair pulled to pt, pt states she does not feel that she is ready to go home. Discussed with RN  History of Present Illness patient is a 75y.o. female s/p Rt TKA on 09/09/2020 with PMH significant for peripheral neuropathy, obesity, migranes, HTN, hyperlipemia, GERD, fibromyalgia, depression, chest pain, CAD, and Rt shoulder surgery (2004).  Subjective Data  Patient Stated Goal get back to playing with her pets and doing household tasks  Precautions  Precautions Knee;Fall  Restrictions  Other Position/Activity Restrictions WBAT  Pain Assessment  Pain Location Rt knee  Pain Descriptors / Indicators Sore;Discomfort;Tender;Grimacing;Crying;Moaning  Cognition  Arousal/Alertness Awake/alert  Behavior During Therapy WFL for tasks assessed/performed  Overall Cognitive Status Within Functional Limits for tasks assessed  Bed Mobility  Overal bed mobility Needs Assistance  Bed Mobility Supine to Sit  Supine to sit Supervision;Min guard  General bed mobility comments incr time, pt using gait belt to self assist RLE  Transfers  Overall transfer level Needs assistance  Equipment used Rolling walker (2 wheeled)  Transfers Sit to/from Stand;Stand Pivot Transfers  Sit to Stand Min assist  General transfer comment cues for hand placement, RLE position. requires incr time  Ambulation/Gait  Ambulation/Gait assistance Min assist;Mod assist  Gait Distance (Feet) 7 Feet  Assistive device Rolling walker (2 wheeled)  Gait Pattern/deviations Step-to pattern;Decreased step length - left;Decreased step length - right;Decreased weight shift to right  General Gait Details cues for posture, RW position, step length, to incr wt on RLE  Total Joint Exercises  Ankle Circles/Pumps AROM;Both;10 reps;Supine  Quad Sets  AROM;Both;10 reps  PT - End of Session  Equipment Utilized During Treatment Gait belt  Activity Tolerance Patient limited by pain  Patient left with call bell/phone within reach;in chair;with chair alarm set  Nurse Communication Mobility status   PT - Assessment/Plan  PT Plan Discharge plan needs to be updated  PT Visit Diagnosis Unsteadiness on feet (R26.81);Muscle weakness (generalized) (M62.81);Pain  Pain - Right/Left Right  Pain - part of body Knee  PT Frequency (ACUTE ONLY) 7X/week  Follow Up Recommendations SNF;Home health PT  PT equipment Other (comment) (delivered)  AM-PAC PT "6 Clicks" Mobility Outcome Measure (Version 2)  Help needed turning from your back to your side while in a flat bed without using bedrails? 3  Help needed moving from lying on your back to sitting on the side of a flat bed without using bedrails? 3  Help needed moving to and from a bed to a chair (including a wheelchair)? 3  Help needed standing up from a chair using your arms (e.g., wheelchair or bedside chair)? 3  Help needed to walk in hospital room? 2  Help needed climbing 3-5 steps with a railing?  2  6 Click Score 16  Consider Recommendation of Discharge To: Home with [facility]  PT Goal Progression  Progress towards PT goals Progressing toward goals  Acute Rehab PT Goals  PT Goal Formulation With patient/family  Time For Goal Achievement 09/16/20  Potential to Achieve Goals Good  PT Time Calculation  PT Start Time (ACUTE ONLY) 1140  PT Stop Time (ACUTE ONLY) 1159  PT Time Calculation (min) (ACUTE ONLY) 19 min  PT General Charges  $$ ACUTE PT VISIT 1 Visit  PT Treatments  $Gait Training 8-22 mins

## 2020-09-11 NOTE — Progress Notes (Signed)
Subjective: 2 Days Post-Op Procedure(s) (LRB): TOTAL KNEE ARTHROPLASTY (Right) Patient reports pain as 3 on 0-10 scale when she is not moving, pain increases with any kind of movement, having difficulty with lifting leg Was limited in PT yesterday due to pain in knee Restarted 1 BP med yesterday, BP continues to be elevated  Objective: Vital signs in last 24 hours: Temp:  [97.8 F (36.6 C)-98.7 F (37.1 C)] 98.6 F (37 C) (03/04 0525) Pulse Rate:  [79-109] 109 (03/04 0525) Resp:  [16-17] 16 (03/04 0525) BP: (112-172)/(56-74) 161/67 (03/04 0525) SpO2:  [91 %-98 %] 95 % (03/04 0525)  Intake/Output from previous day: 03/03 0701 - 03/04 0700 In: 1627.5 [P.O.:600; I.V.:927.5; IV Piggyback:100] Out: 200 [Urine:200] Intake/Output this shift: No intake/output data recorded.  Recent Labs    09/09/20 1124  HGB 11.4*   Recent Labs    09/09/20 1124  WBC 8.7  RBC 3.70*  HCT 35.4*  PLT 278   Recent Labs    09/09/20 1124  CREATININE 1.15*   No results for input(s): LABPT, INR in the last 72 hours.  Neurologically intact Neurovascular intact Sensation intact distally Intact pulses distally Dorsiflexion/Plantar flexion intact Incision: dressing C/D/I Compartment soft   Assessment/Plan: 2 Days Post-Op Procedure(s) (LRB): TOTAL KNEE ARTHROPLASTY (Right) Up with therapy, continue working on ambulation Change to Pain meds Norco 10/325 to see if improved help with pain in knee Will restart both BP meds this am to bring BP down Hopefull discharge today but depends on PT, if not hopeful d/c this weekend. Plan to go home and start OPPT     Patient's anticipated LOS is less than 2 midnights, meeting these requirements: - Younger than 27 - Lives within 1 hour of care - Has a competent adult at home to recover with post-op recover - NO history of  - Chronic pain requiring opiods  - Diabetes  - Coronary Artery Disease  - Heart failure  - Heart attack  - Stroke  -  DVT/VTE  - Cardiac arrhythmia  - Respiratory Failure/COPD  - Renal failure  - Anemia  - Advanced Liver disease       Derrick Ravel [phone] 09/11/2020, 7:11 AM

## 2020-09-11 NOTE — Plan of Care (Signed)
  Problem: Education: Goal: Knowledge of General Education information will improve Description: Including pain rating scale, medication(s)/side effects and non-pharmacologic comfort measures Outcome: Progressing   Problem: Pain Management: Goal: Pain level will decrease with appropriate interventions Outcome: Progressing

## 2020-09-11 NOTE — Progress Notes (Signed)
Physical Therapy Treatment Patient Details Name: [name] MRN: [id number] DOB: 1945/11/22 Today's Date: 09/11/2020    History of Present Illness patient is a 75y.o. female s/p Rt TKA on 09/09/2020 with PMH significant for peripheral neuropathy, obesity, migranes, HTN, hyperlipemia, GERD, fibromyalgia, depression, chest pain, CAD, and Rt shoulder surgery (2004).    PT Comments    Progressing toward goals. Improved this pm, husband able to begin assisting with gait. Pt continues to require chair follow and fatigue rapidly.  Will likely need another day of PT to incr independence and safety for return home.   Follow Up Recommendations  SNF;Home health PT     Equipment Recommendations  Other (comment) (delivered)    Recommendations for Other Services       Precautions / Restrictions Precautions Precautions: Knee;Fall Restrictions Weight Bearing Restrictions: No Other Position/Activity Restrictions: WBAT    Mobility  Bed Mobility Overal bed mobility: Needs Assistance Bed Mobility: Supine to Sit     Supine to sit: Supervision;Min guard     General bed mobility comments: incr time, pt using gait belt to self assist RLE    Transfers Overall transfer level: Needs assistance Equipment used: Rolling walker (2 wheeled) Transfers: Sit to/from [facility] Sit to Stand: Min assist;Min guard Stand pivot transfers: Min assist       General transfer comment: cues for hand placement, RLE position. requires incr time  Ambulation/Gait Ambulation/Gait assistance: Min assist;Mod assist Gait Distance (Feet): 29 Feet Assistive device: Rolling walker (2 wheeled) Gait Pattern/deviations: Step-to pattern;Decreased step length - left;Decreased step length - right;Decreased weight shift to right Gait velocity: decr   General Gait Details: cues for posture, RW position, step length, to incr wt on RLE. 3 standing rests   Environmental education officer Rankin (Stroke Patients Only)       Balance                                            Cognition Arousal/Alertness: Awake/alert Behavior During Therapy: WFL for tasks assessed/performed Overall Cognitive Status: Within Functional Limits for tasks assessed                                        Exercises Total Joint Exercises Ankle Circles/Pumps: AROM;Both;10 reps;Supine Quad Sets: AROM;Both;10 reps Heel Slides: AAROM;Right;10 reps Straight Leg Raises: AAROM;Right;5 reps Goniometric ROM: grossly 10 to 50 degrees right knee flexion    General Comments        Pertinent Vitals/Pain Pain Assessment: 0-10 Pain Score: 6  Pain Location: Rt knee Pain Descriptors / Indicators: Sore;Discomfort;Tender;Grimacing;Crying;Moaning Pain Intervention(s): Limited activity within patient's tolerance;Monitored during session;Premedicated before session    Home Living                      Prior Function            PT Goals (current goals can now be found in the care plan section) Acute Rehab PT Goals Patient Stated Goal: get back to playing with her pets and doing household tasks PT Goal Formulation: With patient/family Time For Goal Achievement: 09/16/20 Potential to Achieve Goals: Good Progress towards PT goals: Progressing toward goals    Frequency  7X/week      PT Plan Discharge plan needs to be updated    Co-evaluation              AM-PAC PT "6 Clicks" Mobility   Outcome Measure  Help needed turning from your back to your side while in a flat bed without using bedrails?: A Little Help needed moving from lying on your back to sitting on the side of a flat bed without using bedrails?: A Little Help needed moving to and from a bed to a chair (including a wheelchair)?: A Little Help needed standing up from a chair using your arms (e.g., wheelchair or bedside chair)?: A Little Help needed to walk in  hospital room?: A Lot Help needed climbing 3-5 steps with a railing? : A Lot 6 Click Score: 16    End of Session Equipment Utilized During Treatment: Gait belt Activity Tolerance: Patient limited by pain Patient left: with call bell/phone within reach;in chair;with chair alarm set;with family/visitor present Nurse Communication: Mobility status PT Visit Diagnosis: Unsteadiness on feet (R26.81);Muscle weakness (generalized) (M62.81);Pain Pain - Right/Left: Right Pain - part of body: Knee     Time: 1315-1340 PT Time Calculation (min) (ACUTE ONLY): 25 min  Charges:  $Gait Training: 23-37 mins $Therapeutic Activity: 8-22 mins                     Baxter Flattery, PT  Acute Rehab Dept ([place]) [phone] Pager [phone]  09/11/2020    [facility] 09/11/2020, 1:48 PM

## 2020-09-12 DIAGNOSIS — M1711 Unilateral primary osteoarthritis, right knee: Secondary | ICD-10-CM | POA: Diagnosis not present

## 2020-09-12 NOTE — Progress Notes (Signed)
Subjective: 3 Days Post-Op Procedure(s) (LRB): TOTAL KNEE ARTHROPLASTY (Right) Patient reports pain as mild.  While at rest, severe with movement. Limited in PT due to pain yesterday.  +void, +flatus Tolerating PO without nausea or vomiting.   Objective: Vital signs in last 24 hours: Temp:  [98.6 F (37 C)-99.3 F (37.4 C)] 98.6 F (37 C) (03/05 0512) Pulse Rate:  [92-107] 107 (03/05 0512) Resp:  [15-17] 15 (03/05 0512) BP: (143-147)/(50-64) 144/58 (03/05 0512) SpO2:  [95 %-97 %] 97 % (03/05 0512)  Intake/Output from previous day: 03/04 0701 - 03/05 0700 In: 690 [P.O.:690] Out: 400 [Urine:400] Intake/Output this shift: No intake/output data recorded.  Recent Labs    09/09/20 1124  HGB 11.4*   Recent Labs    09/09/20 1124  WBC 8.7  RBC 3.70*  HCT 35.4*  PLT 278   Recent Labs    09/09/20 1124  CREATININE 1.15*   No results for input(s): LABPT, INR in the last 72 hours.  Neurologically intact ABD soft Neurovascular intact Sensation intact distally Intact pulses distally Dorsiflexion/Plantar flexion intact Incision: scant drainage No cellulitis present Compartment soft   Assessment/Plan: 3 Days Post-Op Procedure(s) (LRB): TOTAL KNEE ARTHROPLASTY (Right) Advance diet Up with therapy Plan D/C home when cleared by PT, possibly today versus tomorrow. Will start OPPT upon D/C.     Yvonne Kendall Vidalia Serpas 09/12/2020, 8:24 AM

## 2020-09-12 NOTE — Plan of Care (Signed)
  Problem: Activity: Goal: Risk for activity intolerance will decrease Outcome: Progressing   Problem: Pain Managment: Goal: General experience of comfort will improve Outcome: Progressing   Problem: Pain Management: Goal: Pain level will decrease with appropriate interventions Outcome: Progressing

## 2020-09-12 NOTE — Plan of Care (Signed)
Patient discharged home in stable condition 

## 2020-09-12 NOTE — Progress Notes (Signed)
Physical Therapy Treatment Patient Details Name: [name] MRN: [id number] DOB: 1946-03-23 Today's Date: 09/12/2020    History of Present Illness patient is a 75y.o. female s/p Rt TKA on 09/09/2020 with PMH significant for peripheral neuropathy, obesity, migranes, HTN, hyperlipemia, GERD, fibromyalgia, depression, chest pain, CAD, and Rt shoulder surgery (2004).    PT Comments    Continues to be limited by pain.functional status remains grossly  unchanged from yesterday. Will see again in pm, try stair training in preparation for d/c  Follow Up Recommendations  SNF;Home health PT     Equipment Recommendations  Other (comment) (delivered)    Recommendations for Other Services       Precautions / Restrictions Precautions Precautions: Knee;Fall Restrictions Weight Bearing Restrictions: No Other Position/Activity Restrictions: WBAT    Mobility  Bed Mobility Overal bed mobility: Needs Assistance Bed Mobility: Supine to Sit     Supine to sit: Supervision;Min guard     General bed mobility comments: incr time, pt usingLLE to to self assist RLE    Transfers Overall transfer level: Needs assistance Equipment used: Rolling walker (2 wheeled) Transfers: Sit to/from [facility] Sit to Stand: Min assist;Min guard Stand pivot transfers: Min assist       General transfer comment: cues for hand placement, RLE position. requires incr time  Ambulation/Gait Ambulation/Gait assistance: Min assist Gait Distance (Feet): 40 Feet Assistive device: Rolling walker (2 wheeled) Gait Pattern/deviations: Step-to pattern;Decreased step length - left;Decreased step length - right;Decreased weight shift to right Gait velocity: decr   General Gait Details: cues for posture, RW position, step length, to incr wt on RLE. 2 standing rests   Marine scientist Rankin (Stroke Patients Only)       Balance                                             Cognition Arousal/Alertness: Awake/alert Behavior During Therapy: WFL for tasks assessed/performed Overall Cognitive Status: Within Functional Limits for tasks assessed                                        Exercises Total Joint Exercises Ankle Circles/Pumps: AROM;Both;10 reps;Supine Quad Sets: Limitations Quad Sets Limitations: pain    General Comments        Pertinent Vitals/Pain Pain Assessment: 0-10 Pain Score: 9  Pain Location: Rt knee Pain Descriptors / Indicators: Sore;Discomfort;Tender;Grimacing;Crying;Moaning Pain Intervention(s): Limited activity within patient's tolerance;Monitored during session;Premedicated before session;Repositioned    Home Living                      Prior Function            PT Goals (current goals can now be found in the care plan section) Acute Rehab PT Goals Patient Stated Goal: get back to playing with her pets and doing household tasks PT Goal Formulation: With patient/family Time For Goal Achievement: 09/16/20 Potential to Achieve Goals: Good Progress towards PT goals: Progressing toward goals (slowly)    Frequency    7X/week      PT Plan Discharge plan needs to be updated    Co-evaluation  AM-PAC PT "6 Clicks" Mobility   Outcome Measure  Help needed turning from your back to your side while in a flat bed without using bedrails?: A Little Help needed moving from lying on your back to sitting on the side of a flat bed without using bedrails?: A Little Help needed moving to and from a bed to a chair (including a wheelchair)?: A Little Help needed standing up from a chair using your arms (e.g., wheelchair or bedside chair)?: A Little Help needed to walk in hospital room?: A Lot Help needed climbing 3-5 steps with a railing? : A Lot 6 Click Score: 16    End of Session Equipment Utilized During Treatment: Gait belt Activity Tolerance:  Patient limited by pain Patient left: with call bell/phone within reach;in chair;with chair alarm set Nurse Communication: Mobility status PT Visit Diagnosis: Unsteadiness on feet (R26.81);Muscle weakness (generalized) (M62.81);Pain Pain - Right/Left: Right Pain - part of body: Knee     Time: 1130-1151 PT Time Calculation (min) (ACUTE ONLY): 21 min  Charges:  $Gait Training: 8-22 mins                     Baxter Flattery, PT  Acute Rehab Dept ([place]) [phone] Pager [phone]  09/12/2020    [facility] 09/12/2020, 1:42 PM

## 2020-09-12 NOTE — Progress Notes (Signed)
09/12/20 1400  PT Visit Information  Last PT Received On 09/12/20  Pt progressing well this pm. Pain control improved. Reviewed gait/HEP/stairs with pt and husband. See below for details. Ready for d/c with family assist from PT standpoint.   Assistance Needed +1  History of Present Illness patient is a 74y.o. female s/p Rt TKA on 09/09/2020 with PMH significant for peripheral neuropathy, obesity, migranes, HTN, hyperlipemia, GERD, fibromyalgia, depression, chest pain, CAD, and Rt shoulder surgery (2004).  Subjective Data  Patient Stated Goal get back to playing with her pets and doing household tasks  Precautions  Precautions Knee;Fall  Restrictions  Other Position/Activity Restrictions WBAT  Pain Assessment  Pain Assessment 0-10  Pain Score 5  Pain Location Rt knee  Pain Descriptors / Indicators Sore;Discomfort;Tender;Grimacing;Crying;Moaning  Pain Intervention(s) Limited activity within patient's tolerance;Monitored during session;Premedicated before session;Ice applied  Cognition  Arousal/Alertness Awake/alert  Behavior During Therapy WFL for tasks assessed/performed  Overall Cognitive Status Within Functional Limits for tasks assessed  Bed Mobility  General bed mobility comments in recliner  Transfers  Overall transfer level Needs assistance  Equipment used Rolling walker (2 wheeled)  Transfers Sit to/from [employer] Transfers  Sit to Stand Min guard  General transfer comment cues for hand placement, RLE position. requires incr time. min/guard for safety  Ambulation/Gait  Ambulation/Gait assistance Min guard  Gait Distance (Feet) 35 Feet  Assistive device Rolling walker (2 wheeled)  Gait Pattern/deviations Step-to pattern;Decreased step length - left;Decreased step length - right;Decreased weight shift to right  General Gait Details cues for posture, RW position, step length, to incr wt on RLE. 2 standing rests  Gait velocity decr  Stairs Yes  Stairs assistance Min  guard;Min assist  Stair Management Step to pattern;Forwards;Two rails  Number of Stairs 3  General stair comments cues for sequence. no LOB. spouse present  Total Joint Exercises  Ankle Circles/Pumps AROM;Both;10 reps;Supine  Quad Sets AROM;Right;5 reps  Heel Slides AAROM;Right;10 reps;Limitations (pain)  Straight Leg Raises AAROM;Right;5 reps  Quad Sets Limitations pain  PT - End of Session  Equipment Utilized During Treatment Gait belt  Activity Tolerance Patient tolerated treatment well  Patient left with call bell/phone within reach;in chair;with chair alarm set  Nurse Communication Mobility status   PT - Assessment/Plan  PT Plan Discharge plan needs to be updated  PT Visit Diagnosis Unsteadiness on feet (R26.81);Muscle weakness (generalized) (M62.81);Pain  Pain - Right/Left Right  Pain - part of body Knee  PT Frequency (ACUTE ONLY) 7X/week  Follow Up Recommendations Home health PT  PT equipment Other (comment) (delivered)  AM-PAC PT "6 Clicks" Mobility Outcome Measure (Version 2)  Help needed turning from your back to your side while in a flat bed without using bedrails? 3  Help needed moving from lying on your back to sitting on the side of a flat bed without using bedrails? 3  Help needed moving to and from a bed to a chair (including a wheelchair)? 3  Help needed standing up from a chair using your arms (e.g., wheelchair or bedside chair)? 3  Help needed to walk in hospital room? 3  Help needed climbing 3-5 steps with a railing?  3  6 Click Score 18  Consider Recommendation of Discharge To: Home with [facility]  PT Goal Progression  Progress towards PT goals Progressing toward goals  Acute Rehab PT Goals  PT Goal Formulation With patient/family  Time For Goal Achievement 09/16/20  Potential to Achieve Goals Good  PT Time Calculation  PT  Start Time (ACUTE ONLY) 1436  PT Stop Time (ACUTE ONLY) 1457  PT Time Calculation (min) (ACUTE ONLY) 21 min  PT General Charges  $$ ACUTE  PT VISIT 1 Visit  PT Treatments  $Gait Training 8-22 mins

## 2021-06-22 ENCOUNTER — Ambulatory Visit: Payer: Medicare Other | Admitting: Emergency Medicine

## 2022-07-23 NOTE — Progress Notes (Signed)
Cardiology Office Note   Date:  08/03/2022   ID:  [name], [name] Mar 23, 1946, MRN [id number]  PCP:  Reynold Bowen, MD  Cardiologist:  Jenkins Rouge, MD   No chief complaint on file.     History of Present Illness: [name] is a 77 y.o. female who presents today to follow-up Previous patient of Dr Ron Parker She does not have documented coronary disease. She had some chest discomfort 2016  and was admitted for evaluation. During the hospitalization the nuclear stress study was normal. Symptoms thought due to GERD     Compliant with BP meds Cozaar  Has carotid dx with plaque no stenosis by duplex July 2020   On zocor for HLD primary follows labs Takes Celexa for depression   Bilateral leg pain with arthritis pos Right TKA 09/09/20   Her husband is healthy and has 3 kids with grand babies   She probably shops on [employer] too much   Sees Dr Forde Dandy as her primary  Past Medical History:  Diagnosis Date   Carotid artery disease ([place])    Doppler, April, 2012,  4-09% R. ICA, 81-19% LICA... no significant change... frequency of Doppler moved to yearly because of stability   Chest pain    Nuclear stress test 2006 normal   Degenerative joint disease    cervical spine   Depression    Diastolic dysfunction    Echo report, June, 2013   Diverticulosis of colon (without mention of hemorrhage) 10/03/2007   Colonoscopy   Drug therapy    msucle aches with simvastatin, but tolerates '5mg'$  Creastor   Ejection fraction    EF 60%, echo, December 20, 2011   Fatigue    June, 2013   Fibromyalgia    GERD (gastroesophageal reflux disease)    Hx of colonoscopy    Hyperlipemia    Hypertension    IBS (irritable bowel syndrome)    Internal hemorrhoids without mention of complication 1/47/8295   Colonoscopy   Lumbar disc disease    Migraines    Obesity    Pancreatitis    Peripheral neuropathy    Reflux esophagitis 04/21/00   EGD   Sleep apnea    Pt denies   Small fiber neuropathy      Past Surgical History:  Procedure Laterality Date   APPENDECTOMY  1961   FINGER SURGERY Right    LAPAROSCOPIC OVARIAN CYSTECTOMY  1960's   SHOULDER SURGERY  2004   "frozen"; right   TOTAL KNEE ARTHROPLASTY Right 09/09/2020   Procedure: TOTAL KNEE ARTHROPLASTY;  Surgeon: Sydnee Cabal, MD;  Location: [facility];  Service: Orthopedics;  Laterality: Right;  adductor canal   TRIGGER FINGER RELEASE  1996   right thumb    Patient Active Problem List   Diagnosis Date Noted   Osteoarthritis of right knee 09/09/2020   Sleep apnea    Small fiber neuropathy    Peripheral neuropathy    Pancreatitis    Obesity    Migraines    Lumbar disc disease    IBS (irritable bowel syndrome)    Hypertension    Hyperlipemia    Hx of colonoscopy    GERD (gastroesophageal reflux disease)    Degenerative joint disease    Encounter for Medicare annual wellness exam 02/25/2015   Insomnia 02/18/2015   Precordial chest pain 01/12/2015   Fatigue    Diastolic dysfunction    Ejection fraction    Routine health maintenance 03/26/2011   Carotid artery disease ([place])  Drug therapy    PEDAL EDEMA 03/17/2010   INSOMNIA, CHRONIC 04/17/2009   OSTEOARTHROSIS UNSPEC WHETHER GEN/LOC ANK&FOOT 01/05/2009   Depression 05/22/2008   TINNITUS 08/30/2007   Hyperlipidemia 04/05/2007   PERIPHERAL NEUROPATHY 04/05/2007   Essential hypertension 04/05/2007   GASTROESOPHAGEAL REFLUX DISEASE 04/05/2007   DEGENERATIVE JOINT DISEASE, CERVICAL SPINE 04/05/2007   DISC DISEASE, LUMBAR 04/05/2007   Fibromyalgia 04/05/2007   MIGRAINES, HX OF 04/05/2007   APPENDECTOMY, HX OF 04/05/2007   Reflux esophagitis 04/21/2000      Current Outpatient Medications  Medication Sig Dispense Refill   Apoaequorin (PREVAGEN EXTRA STRENGTH) 20 MG CAPS Take 20 mg by mouth daily.     DULoxetine (CYMBALTA) 30 MG capsule Take 30 mg by mouth daily.     gabapentin (NEURONTIN) 300 MG capsule Take 1 capsule by mouth  twice daily (Patient taking  differently: Take 300 mg by mouth at bedtime.) 180 capsule 3   hydrochlorothiazide (HYDRODIURIL) 25 MG tablet Take 1 tablet (25 mg total) by mouth daily. 90 tablet 3   Liniments (BLUE-EMU SUPER STRENGTH EX) Apply 1 application topically daily as needed (pain).     losartan (COZAAR) 100 MG tablet Take 1 tablet (100 mg total) by mouth daily. 90 tablet 3   Melatonin 10 MG CAPS Take 10 mg by mouth at bedtime.     methocarbamol (ROBAXIN) 500 MG tablet Take 500 mg by mouth daily as needed for muscle spasms.     methocarbamol (ROBAXIN) 500 MG tablet Take 1 tablet (500 mg total) by mouth 4 (four) times daily. 40 tablet 0   omeprazole (PRILOSEC) 40 MG capsule Take 40 mg by mouth every Monday, Wednesday, and Friday.     rosuvastatin (CRESTOR) 5 MG tablet Take 5 mg by mouth daily.     No current facility-administered medications for this visit.    Allergies:   Mobic [meloxicam], Codeine, Nitrofuran derivatives, Penicillins, Temazepam, Celebrex [celecoxib], and Influenza vac split [influenza virus vaccine]    Social History:  The patient  reports that she quit smoking about 26 years ago. Her smoking use included cigarettes. She has a 15.00 pack-year smoking history. She has never used smokeless tobacco. She reports that she does not drink alcohol and does not use drugs.   Family History:  The patient's family history includes Breast cancer in her paternal aunt; Coronary artery disease in her father; Dementia in her mother; Heart attack in her father; Heart disease in her father and mother; Hypertension in her brother; Obesity in her brother; Stroke in her mother.    ROS:  Please see the history of present illness.     Patient denies fever, chills, headache, sweats, rash, change in vision, change in hearing, chest pain, cough, nausea or vomiting, urinary symptoms. All other systems are reviewed and are negative.   PHYSICAL EXAM: VS:  BP 128/76   Pulse 82   Ht '5\' 1"'$  (1.549 m)   Wt 160 lb 9.6 oz (72.8  kg)   SpO2 94%   BMI 30.35 kg/m  , Affect appropriate Healthy:  appears stated age HEENT: normal Neck supple with no adenopathy JVP normal no bruits no thyromegaly Lungs RUL wheezing  S1/S2 no murmur, no rub, gallop or click PMI normal Abdomen: benighn, BS positve, no tenderness, no AAA no bruit.  No HSM or HJR Distal pulses intact with no bruits No edema Neuro non-focal Post right TKR     EKG:  08/03/2022 NSR normal ECG 08/03/2022 SR rate 82 normal    Recent  Labs: No results found for requested labs within last 365 days.    Lipid Panel    Component Value Date/Time   CHOL 176 02/23/2015 0844   TRIG 62.0 02/23/2015 0844   TRIG 63 06/19/2006 0922   HDL 67.00 02/23/2015 0844   CHOLHDL 3 02/23/2015 0844   VLDL 12.4 02/23/2015 0844   LDLCALC 96 02/23/2015 0844      Wt Readings from Last 3 Encounters:  08/03/22 160 lb 9.6 oz (72.8 kg)  09/09/20 176 lb 5.9 oz (80 kg)  09/03/20 168 lb (76.2 kg)     Current medicines are reviewed  The patient understands her medicines.   ASSESSMENT AND PLAN:  Chest Pain: resolved normal myovue 2016 HTN:  Well controlled.  Continue current medications and low sodium Dash type diet.   Carotid: plaque no stenosis duplex done 07/25/18  continue 81 mg ASA  HLD:  On statin labs with primary  Depression: on celexa mood seems appropriate  Leg Pain:  Not vascular post right TKR has had PT/OT         F/U in a year    Jenkins Rouge

## 2022-08-03 ENCOUNTER — Encounter: Payer: Self-pay | Admitting: Cardiovascular Disease

## 2022-08-03 ENCOUNTER — Ambulatory Visit: Payer: Medicare Other | Attending: Cardiovascular Disease | Admitting: Cardiovascular Disease

## 2022-08-03 VITALS — BP 128/76 | HR 82 | Ht 61.0 in | Wt 160.6 lb

## 2022-08-03 DIAGNOSIS — R072 Precordial pain: Secondary | ICD-10-CM

## 2022-08-03 DIAGNOSIS — F320 Major depressive disorder, single episode, mild: Secondary | ICD-10-CM

## 2022-08-03 DIAGNOSIS — E782 Mixed hyperlipidemia: Secondary | ICD-10-CM | POA: Diagnosis not present

## 2022-08-03 NOTE — Patient Instructions (Signed)
Medication Instructions:  Your physician recommends that you continue on your current medications as directed. Please refer to the Current Medication list given to you today.  *If you need a refill on your cardiac medications before your next appointment, please call your pharmacy*   Lab Work: NONE If you have labs (blood work) drawn today and your tests are completely normal, you will receive your results only by: [place] (if you have MyChart) OR A paper copy in the mail If you have any lab test that is abnormal or we need to change your treatment, we will call you to review the results.   Testing/Procedures: NONE   Follow-Up: At [facility], you and your health needs are our priority.  As part of our continuing mission to provide you with exceptional heart care, we have created designated Provider Care Teams.  These Care Teams include your primary Cardiologist (physician) and Advanced Practice Providers (APPs -  Physician Assistants and Nurse Practitioners) who all work together to provide you with the care you need, when you need it.  We recommend signing up for the patient portal called "MyChart".  Sign up information is provided on this After Visit Summary.  MyChart is used to connect with patients for Virtual Visits (Telemedicine).  Patients are able to view lab/test results, encounter notes, upcoming appointments, etc.  Non-urgent messages can be sent to your provider as well.   To learn more about what you can do with MyChart, go to [email or web address].    Your next appointment:   1 year(s)  Provider:   Jenkins Rouge, MD
